# Patient Record
Sex: Female | Born: 1950 | Race: White | Hispanic: No | Smoking: Former smoker
Health system: Southern US, Community
[De-identification: ages and names within clinical notes are randomized; demographics above are authoritative.]

## PROBLEM LIST (undated history)

## (undated) DIAGNOSIS — R41841 Cognitive communication deficit: Secondary | ICD-10-CM

## (undated) DIAGNOSIS — K5090 Crohn's disease, unspecified, without complications: Secondary | ICD-10-CM

## (undated) DIAGNOSIS — R278 Other lack of coordination: Secondary | ICD-10-CM

## (undated) DIAGNOSIS — M6281 Muscle weakness (generalized): Secondary | ICD-10-CM

## (undated) DIAGNOSIS — R2681 Unsteadiness on feet: Secondary | ICD-10-CM

## (undated) DIAGNOSIS — R7881 Bacteremia: Secondary | ICD-10-CM

---

## 2019-01-24 ENCOUNTER — Inpatient Hospital Stay (HOSPITAL_COMMUNITY)
Admission: EM | Admit: 2019-01-24 | Discharge: 2019-02-01 | DRG: 315 | Disposition: A | Discharge status: Skilled Nursing Facility | Payer: Medicare Other | Attending: Internal Medicine | Admitting: Internal Medicine

## 2019-01-24 ENCOUNTER — Observation Stay (HOSPITAL_COMMUNITY): Payer: Medicare Other

## 2019-01-24 ENCOUNTER — Emergency Department (HOSPITAL_COMMUNITY): Payer: Medicare Other

## 2019-01-24 ENCOUNTER — Other Ambulatory Visit: Payer: Self-pay

## 2019-01-24 ENCOUNTER — Encounter (HOSPITAL_COMMUNITY): Payer: Self-pay | Admitting: Emergency Medicine

## 2019-01-24 DIAGNOSIS — K632 Fistula of intestine: Secondary | ICD-10-CM | POA: Diagnosis present

## 2019-01-24 DIAGNOSIS — R748 Abnormal levels of other serum enzymes: Secondary | ICD-10-CM | POA: Diagnosis present

## 2019-01-24 DIAGNOSIS — F329 Major depressive disorder, single episode, unspecified: Secondary | ICD-10-CM | POA: Diagnosis present

## 2019-01-24 DIAGNOSIS — D649 Anemia, unspecified: Secondary | ICD-10-CM

## 2019-01-24 DIAGNOSIS — E871 Hypo-osmolality and hyponatremia: Secondary | ICD-10-CM

## 2019-01-24 DIAGNOSIS — E876 Hypokalemia: Secondary | ICD-10-CM | POA: Diagnosis present

## 2019-01-24 DIAGNOSIS — R7881 Bacteremia: Secondary | ICD-10-CM | POA: Diagnosis present

## 2019-01-24 DIAGNOSIS — Z794 Long term (current) use of insulin: Secondary | ICD-10-CM

## 2019-01-24 DIAGNOSIS — Z681 Body mass index (BMI) 19 or less, adult: Secondary | ICD-10-CM

## 2019-01-24 DIAGNOSIS — K5090 Crohn's disease, unspecified, without complications: Secondary | ICD-10-CM | POA: Diagnosis present

## 2019-01-24 DIAGNOSIS — D638 Anemia in other chronic diseases classified elsewhere: Secondary | ICD-10-CM | POA: Diagnosis present

## 2019-01-24 DIAGNOSIS — A0472 Enterocolitis due to Clostridium difficile, not specified as recurrent: Secondary | ICD-10-CM

## 2019-01-24 DIAGNOSIS — Z20828 Contact with and (suspected) exposure to other viral communicable diseases: Secondary | ICD-10-CM | POA: Diagnosis present

## 2019-01-24 DIAGNOSIS — G8929 Other chronic pain: Secondary | ICD-10-CM | POA: Diagnosis present

## 2019-01-24 DIAGNOSIS — K50113 Crohn's disease of large intestine with fistula: Secondary | ICD-10-CM | POA: Diagnosis not present

## 2019-01-24 DIAGNOSIS — Z87891 Personal history of nicotine dependence: Secondary | ICD-10-CM

## 2019-01-24 DIAGNOSIS — F419 Anxiety disorder, unspecified: Secondary | ICD-10-CM | POA: Diagnosis present

## 2019-01-24 DIAGNOSIS — B957 Other staphylococcus as the cause of diseases classified elsewhere: Secondary | ICD-10-CM | POA: Diagnosis present

## 2019-01-24 DIAGNOSIS — M797 Fibromyalgia: Secondary | ICD-10-CM | POA: Diagnosis present

## 2019-01-24 DIAGNOSIS — Z881 Allergy status to other antibiotic agents status: Secondary | ICD-10-CM

## 2019-01-24 DIAGNOSIS — Z79899 Other long term (current) drug therapy: Secondary | ICD-10-CM

## 2019-01-24 DIAGNOSIS — Y838 Other surgical procedures as the cause of abnormal reaction of the patient, or of later complication, without mention of misadventure at the time of the procedure: Secondary | ICD-10-CM | POA: Diagnosis present

## 2019-01-24 DIAGNOSIS — T80211A Bloodstream infection due to central venous catheter, initial encounter: Secondary | ICD-10-CM | POA: Diagnosis not present

## 2019-01-24 DIAGNOSIS — A0471 Enterocolitis due to Clostridium difficile, recurrent: Secondary | ICD-10-CM | POA: Diagnosis present

## 2019-01-24 DIAGNOSIS — K219 Gastro-esophageal reflux disease without esophagitis: Secondary | ICD-10-CM | POA: Diagnosis present

## 2019-01-24 DIAGNOSIS — Z888 Allergy status to other drugs, medicaments and biological substances status: Secondary | ICD-10-CM

## 2019-01-24 DIAGNOSIS — Z22322 Carrier or suspected carrier of Methicillin resistant Staphylococcus aureus: Secondary | ICD-10-CM

## 2019-01-24 DIAGNOSIS — E785 Hyperlipidemia, unspecified: Secondary | ICD-10-CM | POA: Diagnosis present

## 2019-01-24 DIAGNOSIS — R64 Cachexia: Secondary | ICD-10-CM | POA: Diagnosis present

## 2019-01-24 LAB — CBC WITH DIFFERENTIAL/PLATELET
Abs Immature Granulocytes: 0.03 10*3/uL (ref 0.00–0.07)
Basophils Absolute: 0 10*3/uL (ref 0.0–0.1)
Basophils Relative: 0 %
Eosinophils Absolute: 0 10*3/uL (ref 0.0–0.5)
Eosinophils Relative: 0 %
HCT: 25.4 % — ABNORMAL LOW (ref 36.0–46.0)
Hemoglobin: 7.6 g/dL — ABNORMAL LOW (ref 12.0–15.0)
Immature Granulocytes: 0 %
Lymphocytes Relative: 21 %
Lymphs Abs: 1.4 10*3/uL (ref 0.7–4.0)
MCH: 27.5 pg (ref 26.0–34.0)
MCHC: 29.9 g/dL — ABNORMAL LOW (ref 30.0–36.0)
MCV: 92 fL (ref 80.0–100.0)
Monocytes Absolute: 0.4 10*3/uL (ref 0.1–1.0)
Monocytes Relative: 6 %
Neutro Abs: 4.9 10*3/uL (ref 1.7–7.7)
Neutrophils Relative %: 73 %
Platelets: 234 10*3/uL (ref 150–400)
RBC: 2.76 MIL/uL — ABNORMAL LOW (ref 3.87–5.11)
RDW: 16.7 % — ABNORMAL HIGH (ref 11.5–15.5)
WBC: 6.7 10*3/uL (ref 4.0–10.5)
nRBC: 0 % (ref 0.0–0.2)

## 2019-01-24 LAB — COMPREHENSIVE METABOLIC PANEL
ALT: 66 U/L — ABNORMAL HIGH (ref 0–44)
AST: 52 U/L — ABNORMAL HIGH (ref 15–41)
Albumin: 2.4 g/dL — ABNORMAL LOW (ref 3.5–5.0)
Alkaline Phosphatase: 169 U/L — ABNORMAL HIGH (ref 38–126)
Anion gap: 9 (ref 5–15)
BUN: 57 mg/dL — ABNORMAL HIGH (ref 8–23)
CO2: 19 mmol/L — ABNORMAL LOW (ref 22–32)
Calcium: 9.4 mg/dL (ref 8.9–10.3)
Chloride: 101 mmol/L (ref 98–111)
Creatinine, Ser: 0.97 mg/dL (ref 0.44–1.00)
GFR calc Af Amer: >60 mL/min (ref >60)
GFR calc non Af Amer: >60 mL/min (ref >60)
Glucose, Bld: 102 mg/dL — ABNORMAL HIGH (ref 70–99)
Potassium: 4.6 mmol/L (ref 3.5–5.1)
Sodium: 129 mmol/L — ABNORMAL LOW (ref 135–145)
Total Bilirubin: 0.9 mg/dL (ref 0.3–1.2)
Total Protein: 7.5 g/dL (ref 6.5–8.1)

## 2019-01-24 LAB — LACTIC ACID, PLASMA: Lactic Acid, Venous: 1 mmol/L (ref 0.5–1.9)

## 2019-01-24 LAB — ABO/RH: ABO/RH(D): O POS

## 2019-01-24 LAB — HIV ANTIBODY (ROUTINE TESTING W REFLEX): HIV Screen 4th Generation wRfx: NONREACTIVE

## 2019-01-24 LAB — SARS CORONAVIRUS 2 BY RT PCR (HOSPITAL ORDER, PERFORMED IN ~~LOC~~ HOSPITAL LAB): SARS Coronavirus 2: NEGATIVE

## 2019-01-24 MED ORDER — LOPERAMIDE HCL 2 MG PO CAPS
4.0000 mg | ORAL_CAPSULE | Freq: Four times a day (QID) | ORAL | Status: DC | PRN
  Administered 2019-01-30: 01:00:00 4 mg via ORAL
  Filled 2019-01-24 (×2): qty 2

## 2019-01-24 MED ORDER — ACETAMINOPHEN 500 MG PO TABS
1000.0000 mg | ORAL_TABLET | Freq: Once | ORAL | Status: AC
  Administered 2019-01-24: 1000 mg via ORAL
  Filled 2019-01-24: qty 2

## 2019-01-24 MED ORDER — VANCOMYCIN 50 MG/ML ORAL SOLUTION
125.0000 mg | Freq: Four times a day (QID) | ORAL | Status: DC
  Administered 2019-01-24 – 2019-02-01 (×34): 125 mg via ORAL
  Filled 2019-01-24 (×36): qty 2.5

## 2019-01-24 MED ORDER — PRAVASTATIN SODIUM 20 MG PO TABS
20.0000 mg | ORAL_TABLET | Freq: Every day | ORAL | Status: DC
  Administered 2019-01-24 – 2019-02-01 (×9): 20 mg via ORAL
  Filled 2019-01-24 (×9): qty 1

## 2019-01-24 MED ORDER — ACETAMINOPHEN 650 MG RE SUPP: 650.0000 mg | Freq: Four times a day (QID) | RECTAL | Status: DC | PRN

## 2019-01-24 MED ORDER — ESCITALOPRAM OXALATE 5 MG PO TABS
5.0000 mg | ORAL_TABLET | Freq: Every day | ORAL | Status: DC
  Administered 2019-01-24 – 2019-02-01 (×9): 5 mg via ORAL
  Filled 2019-01-24 (×9): qty 1

## 2019-01-24 MED ORDER — ENOXAPARIN SODIUM 30 MG/0.3ML ~~LOC~~ SOLN
30.0000 mg | SUBCUTANEOUS | Status: DC
  Administered 2019-01-24 – 2019-01-30 (×7): 30 mg via SUBCUTANEOUS
  Filled 2019-01-24 (×7): qty 0.3

## 2019-01-24 MED ORDER — DERMACLOUD EX CREA: 1.0000 "application " | TOPICAL_CREAM | Freq: Two times a day (BID) | CUTANEOUS | Status: DC | PRN

## 2019-01-24 MED ORDER — MUSCLE RUB 10-15 % EX CREA
TOPICAL_CREAM | Freq: Three times a day (TID) | CUTANEOUS | Status: DC | PRN
  Filled 2019-01-24: qty 85

## 2019-01-24 MED ORDER — VANCOMYCIN HCL 500 MG IV SOLR
500.0000 mg | INTRAVENOUS | Status: DC
  Administered 2019-01-25 – 2019-01-26 (×2): 500 mg via INTRAVENOUS
  Filled 2019-01-24 (×3): qty 500

## 2019-01-24 MED ORDER — METHOCARBAMOL 500 MG PO TABS
500.0000 mg | ORAL_TABLET | Freq: Two times a day (BID) | ORAL | Status: DC | PRN
  Administered 2019-01-24 – 2019-01-26 (×4): 500 mg via ORAL
  Filled 2019-01-24 (×4): qty 1

## 2019-01-24 MED ORDER — LIDOCAINE HCL 4 % EX CREA: 1.0000 "application " | TOPICAL_CREAM | Freq: Three times a day (TID) | CUTANEOUS | Status: DC | PRN

## 2019-01-24 MED ORDER — INSULIN ASPART 100 UNIT/ML ~~LOC~~ SOLN
1.0000 [IU] | Freq: Three times a day (TID) | SUBCUTANEOUS | Status: DC
  Filled 2019-01-24: qty 0.09

## 2019-01-24 MED ORDER — ZINC OXIDE 40 % EX OINT
TOPICAL_OINTMENT | Freq: Two times a day (BID) | CUTANEOUS | Status: DC | PRN
  Filled 2019-01-24: qty 57

## 2019-01-24 MED ORDER — IOHEXOL 300 MG/ML  SOLN
75.0000 mL | Freq: Once | INTRAMUSCULAR | Status: AC | PRN
  Administered 2019-01-24: 75 mL via INTRAVENOUS

## 2019-01-24 MED ORDER — LIDOCAINE 5 % EX PTCH
1.0000 | MEDICATED_PATCH | CUTANEOUS | Status: DC
  Administered 2019-01-25 – 2019-02-01 (×8): 1 via TRANSDERMAL
  Filled 2019-01-24 (×9): qty 1

## 2019-01-24 MED ORDER — ALBUTEROL SULFATE (2.5 MG/3ML) 0.083% IN NEBU: 2.5000 mg | INHALATION_SOLUTION | RESPIRATORY_TRACT | Status: DC | PRN

## 2019-01-24 MED ORDER — OXYCODONE HCL 5 MG PO TABS
5.0000 mg | ORAL_TABLET | ORAL | Status: DC | PRN
  Administered 2019-01-24 – 2019-02-01 (×19): 5 mg via ORAL
  Filled 2019-01-24 (×19): qty 1

## 2019-01-24 MED ORDER — SODIUM CHLORIDE 0.9 % IV SOLN
2.0000 g | Freq: Once | INTRAVENOUS | Status: AC
  Administered 2019-01-24: 05:00:00 2 g via INTRAVENOUS
  Filled 2019-01-24: qty 2

## 2019-01-24 MED ORDER — VANCOMYCIN HCL IN DEXTROSE 1-5 GM/200ML-% IV SOLN
1000.0000 mg | Freq: Once | INTRAVENOUS | Status: AC
  Administered 2019-01-24: 1000 mg via INTRAVENOUS
  Filled 2019-01-24: qty 200

## 2019-01-24 MED ORDER — KETOROLAC TROMETHAMINE 30 MG/ML IJ SOLN
15.0000 mg | Freq: Once | INTRAMUSCULAR | Status: AC
  Administered 2019-01-24: 15 mg via INTRAVENOUS
  Filled 2019-01-24: qty 1

## 2019-01-24 MED ORDER — SODIUM CHLORIDE (PF) 0.9 % IJ SOLN
INTRAMUSCULAR | Status: AC
  Filled 2019-01-24: qty 50

## 2019-01-24 MED ORDER — HYOSCYAMINE SULFATE 0.125 MG SL SUBL
0.1250 mg | SUBLINGUAL_TABLET | SUBLINGUAL | Status: DC | PRN
  Administered 2019-01-29 – 2019-01-31 (×2): 0.125 mg via SUBLINGUAL
  Filled 2019-01-24 (×3): qty 1

## 2019-01-24 MED ORDER — ZOLPIDEM TARTRATE 5 MG PO TABS
5.0000 mg | ORAL_TABLET | Freq: Every evening | ORAL | Status: DC | PRN
  Administered 2019-01-24 – 2019-01-31 (×7): 5 mg via ORAL
  Filled 2019-01-24 (×7): qty 1

## 2019-01-24 MED ORDER — BUPROPION HCL ER (XL) 300 MG PO TB24
300.0000 mg | ORAL_TABLET | Freq: Every day | ORAL | Status: DC
  Administered 2019-01-24 – 2019-02-01 (×9): 300 mg via ORAL
  Filled 2019-01-24 (×9): qty 1

## 2019-01-24 MED ORDER — ONDANSETRON HCL 4 MG/2ML IJ SOLN: 4.0000 mg | Freq: Four times a day (QID) | INTRAMUSCULAR | Status: DC | PRN

## 2019-01-24 MED ORDER — ACETAMINOPHEN 325 MG PO TABS
650.0000 mg | ORAL_TABLET | Freq: Four times a day (QID) | ORAL | Status: DC | PRN
  Administered 2019-01-25 – 2019-01-30 (×3): 650 mg via ORAL
  Filled 2019-01-24 (×3): qty 2

## 2019-01-24 MED ORDER — PANTOPRAZOLE SODIUM 40 MG PO TBEC
40.0000 mg | DELAYED_RELEASE_TABLET | Freq: Every day | ORAL | Status: DC
  Administered 2019-01-24 – 2019-02-01 (×9): 40 mg via ORAL
  Filled 2019-01-24 (×9): qty 1

## 2019-01-24 MED ORDER — SODIUM CHLORIDE 0.9 % IV SOLN
INTRAVENOUS | Status: DC
  Administered 2019-01-24 – 2019-01-30 (×7): via INTRAVENOUS

## 2019-01-24 MED ORDER — ONDANSETRON HCL 4 MG PO TABS
4.0000 mg | ORAL_TABLET | Freq: Four times a day (QID) | ORAL | Status: DC | PRN
  Filled 2019-01-24: qty 1

## 2019-01-24 MED ORDER — SODIUM CHLORIDE 0.9 % IV SOLN
2.0000 g | Freq: Two times a day (BID) | INTRAVENOUS | Status: DC
  Administered 2019-01-24 – 2019-01-25 (×2): 2 g via INTRAVENOUS
  Filled 2019-01-24 (×2): qty 2

## 2019-01-24 MED ORDER — MELATONIN 3 MG PO TABS
3.0000 mg | ORAL_TABLET | Freq: Every day | ORAL | Status: DC
  Administered 2019-01-30 – 2019-01-31 (×2): 3 mg via ORAL
  Filled 2019-01-24 (×9): qty 1

## 2019-01-24 MED ORDER — SACCHAROMYCES BOULARDII 250 MG PO CAPS
250.0000 mg | ORAL_CAPSULE | Freq: Two times a day (BID) | ORAL | Status: DC
  Administered 2019-01-24 – 2019-02-01 (×17): 250 mg via ORAL
  Filled 2019-01-24 (×18): qty 1

## 2019-01-24 NOTE — ED Triage Notes (Signed)
BIB [facility] from [facility] is there for rehab. Pt reports chronic bilateral hip pain. Per PTAR Pt went to [facility] for pre surgery blood work appt. for colostomy. Per paper work results shpwed staph infection, positive blood cultures. Pt has hx of chron's disease.

## 2019-01-24 NOTE — ED Provider Notes (Signed)
[facility] DEPT Provider Note   CSN: [id number] Arrival date & time: 01/24/19  0206     History   Chief Complaint Chief Complaint  Patient presents with  . Abnormal Lab    HPI Katrina Boyer is a 68 y.o. female.     The history is provided by the patient.  Abnormal Lab Time since result:  Positive blood cultures, staph Patient referred by:  Specialist Resulting agency:  External Result type comment:  Infectious disease Patient with Crohns with Picc line for TPN presents from [facility] with report of staph in 2 outside blood cultures that patient had performed for pre op for colectomy.  No f/c/r. She is not having back pain or weakness.  She has chronic hip pain.  No cough.  No SOB.    History reviewed. No pertinent past medical history.  There are no active problems to display for this patient.   History reviewed. No pertinent surgical history.   OB History   No obstetric history on file.      Home Medications    Prior to Admission medications   Medication Sig Start Date End Date Taking? Authorizing Provider  acetaminophen (TYLENOL) 325 MG tablet Take 650 mg by mouth every 4 (four) hours as needed for mild pain.   Yes [provider]  albuterol (PROVENTIL) (2.5 MG/3ML) 0.083% nebulizer solution Take 2.5 mg by nebulization every 4 (four) hours as needed for wheezing or shortness of breath.   Yes [provider]  buPROPion (WELLBUTRIN XL) 300 MG 24 hr tablet Take 300 mg by mouth daily.   Yes [provider]  cholecalciferol (VITAMIN D3) 25 MCG (1000 UT) tablet Take 2,000 Units by mouth daily.   Yes [provider]  escitalopram (LEXAPRO) 10 MG tablet Take 5 mg by mouth daily.   Yes [provider]  hyoscyamine (LEVSIN SL) 0.125 MG SL tablet Place 0.125 mg under the tongue every 4 (four) hours as needed for cramping.   Yes [provider]  Infant Care Products (DERMACLOUD) CREA Apply 1  application topically 2 (two) times daily as needed (to buttocks).   Yes [provider]  insulin aspart (NOVOLOG) 100 UNIT/ML injection Inject 1-9 Units into the skin 3 (three) times daily before meals. 101-150=1 unit 151-200=2 unit 201-250=3 unit 251-300=5 unit 301-350=7 unit >350= 9 unit Over 400 call MD   Yes [provider]  lidocaine (LIDODERM) 5 % Place 1 patch onto the skin daily. Remove & Discard patch within 12 hours or as directed by MD   Yes [provider]  Lidocaine HCl (ASPERCREME W/LIDOCAINE) 4 % CREA Apply 1 application topically 3 (three) times daily as needed (shoulder pain).   Yes [provider]  loperamide (IMODIUM) 2 MG capsule Take 4 mg by mouth 4 (four) times daily as needed for diarrhea or loose stools.   Yes [provider]  Melatonin 3 MG TABS Take 3 mg by mouth at bedtime.   Yes [provider]  methocarbamol (ROBAXIN) 500 MG tablet Take 500 mg by mouth every 12 (twelve) hours as needed for muscle spasms.   Yes [provider]  oxyCODONE (OXY IR/ROXICODONE) 5 MG immediate release tablet Take 5 mg by mouth every 6 (six) hours as needed (pain).   Yes [provider]  pantoprazole (PROTONIX) 40 MG tablet Take 40 mg by mouth daily.   Yes [provider]  potassium chloride (KLOR-CON) 10 MEQ tablet Take 10 mEq by mouth daily.  Yes [provider]  pravastatin (PRAVACHOL) 20 MG tablet Take 20 mg by mouth daily.   Yes [provider]  saccharomyces boulardii (FLORASTOR) 250 MG capsule Take 250 mg by mouth 2 (two) times daily.   Yes [provider]  vancomycin (VANCOCIN) 125 MG capsule Take 125 mg by mouth 4 (four) times daily.   Yes [provider]  Vitamin D, Ergocalciferol, (DRISDOL) 1.25 MG (50000 UT) CAPS capsule Take 50,000 Units by mouth every 7 (seven) days. Wednesdays   Yes [provider]    Family History No family history on file.   Social History Social History   Tobacco Use  . Smoking status: Not on file  Substance Use Topics  . Alcohol use: Not on file  . Drug use: Not on file     Allergies   Piperacillin-tazobactam in dex, Metronidazole, Gabapentin, Mercaptopurine, and Ciprofloxacin   Review of Systems Review of Systems  Constitutional: Negative for fever.  HENT: Negative for congestion.   Eyes: Negative for visual disturbance.  Respiratory: Negative for cough.   Cardiovascular: Negative for chest pain.  Gastrointestinal: Negative for abdominal pain.  Genitourinary: Negative for dysuria.  Musculoskeletal: Positive for arthralgias. Negative for back pain.  Skin: Negative for color change.  Neurological: Negative for weakness.  Psychiatric/Behavioral: Negative for agitation.  All other systems reviewed and are negative.    Physical Exam Updated Vital Signs BP (!) 102/53 (BP Location: Left Arm)   Pulse 75   Temp 97.9 F (36.6 C) (Oral)   Resp 20   Ht 5' 4"  (1.626 m)   Wt 45.8 kg   SpO2 100%   BMI 17.34 kg/m   Physical Exam Vitals signs and nursing note reviewed.  Constitutional:      General: She is not in acute distress.    Appearance: She is not toxic-appearing.  HENT:     Head: Normocephalic and atraumatic.     Nose: Nose normal.     Mouth/Throat:     Mouth: Mucous membranes are moist.     Pharynx: Oropharynx is clear.  Eyes:     Conjunctiva/sclera: Conjunctivae normal.     Pupils: Pupils are equal, round, and reactive to light.  Neck:     Musculoskeletal: Normal range of motion and neck supple.  Cardiovascular:     Rate and Rhythm: Normal rate and regular rhythm.     Pulses: Normal pulses.     Heart sounds: Normal heart sounds.  Pulmonary:     Effort: Pulmonary effort is normal.     Breath sounds: Normal breath sounds.  Abdominal:     General: Abdomen is flat. Bowel sounds are normal.     Tenderness: There is no abdominal tenderness. There is no guarding or rebound.   Musculoskeletal: Normal range of motion.        General: No swelling or tenderness.  Skin:    General: Skin is warm and dry.     Capillary Refill: Capillary refill takes less than 2 seconds.  Neurological:     General: No focal deficit present.     Mental Status: She is alert and oriented to person, place, and time.  Psychiatric:        Mood and Affect: Mood normal.        Behavior: Behavior normal.      ED Treatments / Results  Labs (all labs ordered are listed, but only abnormal results are displayed) Results for orders placed or performed during the hospital encounter of 01/24/19  CBC with Differential/Platelet  Result Value Ref Range   WBC 6.7 4.0 - 10.5 K/uL   RBC 2.76 (L) 3.87 - 5.11 MIL/uL   Hemoglobin 7.6 (L) 12.0 - 15.0 g/dL   HCT 25.4 (L) 36.0 - 46.0 %   MCV 92.0 80.0 - 100.0 fL   MCH 27.5 26.0 - 34.0 pg   MCHC 29.9 (L) 30.0 - 36.0 g/dL   RDW 16.7 (H) 11.5 - 15.5 %   Platelets 234 150 - 400 K/uL   nRBC 0.0 0.0 - 0.2 %   Neutrophils Relative % 73 %   Neutro Abs 4.9 1.7 - 7.7 K/uL   Lymphocytes Relative 21 %   Lymphs Abs 1.4 0.7 - 4.0 K/uL   Monocytes Relative 6 %   Monocytes Absolute 0.4 0.1 - 1.0 K/uL   Eosinophils Relative 0 %   Eosinophils Absolute 0.0 0.0 - 0.5 K/uL   Basophils Relative 0 %   Basophils Absolute 0.0 0.0 - 0.1 K/uL   Immature Granulocytes 0 %   Abs Immature Granulocytes 0.03 0.00 - 0.07 K/uL  Comprehensive metabolic panel  Result Value Ref Range   Sodium 129 (L) 135 - 145 mmol/L   Potassium 4.6 3.5 - 5.1 mmol/L   Chloride 101 98 - 111 mmol/L   CO2 19 (L) 22 - 32 mmol/L   Glucose, Bld 102 (H) 70 - 99 mg/dL   BUN 57 (H) 8 - 23 mg/dL   Creatinine, Ser 0.97 0.44 - 1.00 mg/dL   Calcium 9.4 8.9 - 10.3 mg/dL   Total Protein 7.5 6.5 - 8.1 g/dL   Albumin 2.4 (L) 3.5 - 5.0 g/dL   AST 52 (H) 15 - 41 U/L   ALT 66 (H) 0 - 44 U/L   Alkaline Phosphatase 169 (H) 38 - 126 U/L   Total Bilirubin 0.9 0.3 - 1.2 mg/dL   GFR calc non Af Amer >60 >60  mL/min   GFR calc Af Amer >60 >60 mL/min   Anion gap 9 5 - 15  Lactic acid, plasma  Result Value Ref Range   Lactic Acid, Venous 1.0 0.5 - 1.9 mmol/L   Dg Chest 2 View  Result Date: 01/24/2019 CLINICAL DATA:  Positive blood cultures EXAM: CHEST - 2 VIEW COMPARISON:  None. FINDINGS: No acute airspace disease or effusion. Right upper extremity venous catheter tip over the brachiocephalic region. Normal heart size. No pneumothorax. Suspect skin fold artifact over the left upper chest IMPRESSION: 1. Right upper extremity catheter tip overlies the brachiocephalic region 2. No focal airspace disease Electronically Signed   By: Donavan Foil M.D.   On: 01/24/2019 04:30   Dg Lumbar Spine Complete  Result Date: 01/24/2019 CLINICAL DATA:  Chronic pain EXAM: LUMBAR SPINE - COMPLETE 4+ VIEW COMPARISON:  None. FINDINGS: Five non rib-bearing lumbar type vertebra. Aortic atherosclerosis. Suspected transitional anatomy. For the purposes of reporting, first non rib-bearing lumbar vertebra will be designated L1. Alignment within normal limits. Treated compression deformity at L4. Mild to moderate degenerative changes at L3-L4, L4-L5 and L5-S1. IMPRESSION: No acute osseous abnormality. Mild to moderate diffuse degenerative change of the lower lumbar spine. Electronically Signed   By: Donavan Foil M.D.   On: 01/24/2019 04:08   Dg Hips Bilat W Or Wo Pelvis 3-4 Views  Result Date: 01/24/2019 CLINICAL DATA:  Chronic hip and low back pain EXAM: DG HIP (WITH OR WITHOUT PELVIS) 3-4V BILAT COMPARISON:  None. FINDINGS: Treated compression deformity in the lower spine. The SI joints are non  widened. The pubic symphysis and rami are intact. No fracture or malalignment. Joint spaces are relatively preserved. IMPRESSION: Negative. Electronically Signed   By: Donavan Foil M.D.   On: 01/24/2019 04:06    EKG None  Radiology Dg Chest 2 View  Result Date: 01/24/2019 CLINICAL DATA:  Positive blood cultures EXAM: CHEST - 2  VIEW COMPARISON:  None. FINDINGS: No acute airspace disease or effusion. Right upper extremity venous catheter tip over the brachiocephalic region. Normal heart size. No pneumothorax. Suspect skin fold artifact over the left upper chest IMPRESSION: 1. Right upper extremity catheter tip overlies the brachiocephalic region 2. No focal airspace disease Electronically Signed   By: Donavan Foil M.D.   On: 01/24/2019 04:30   Dg Lumbar Spine Complete  Result Date: 01/24/2019 CLINICAL DATA:  Chronic pain EXAM: LUMBAR SPINE - COMPLETE 4+ VIEW COMPARISON:  None. FINDINGS: Five non rib-bearing lumbar type vertebra. Aortic atherosclerosis. Suspected transitional anatomy. For the purposes of reporting, first non rib-bearing lumbar vertebra will be designated L1. Alignment within normal limits. Treated compression deformity at L4. Mild to moderate degenerative changes at L3-L4, L4-L5 and L5-S1. IMPRESSION: No acute osseous abnormality. Mild to moderate diffuse degenerative change of the lower lumbar spine. Electronically Signed   By: Donavan Foil M.D.   On: 01/24/2019 04:08   Dg Hips Bilat W Or Wo Pelvis 3-4 Views  Result Date: 01/24/2019 CLINICAL DATA:  Chronic hip and low back pain EXAM: DG HIP (WITH OR WITHOUT PELVIS) 3-4V BILAT COMPARISON:  None. FINDINGS: Treated compression deformity in the lower spine. The SI joints are non widened. The pubic symphysis and rami are intact. No fracture or malalignment. Joint spaces are relatively preserved. IMPRESSION: Negative. Electronically Signed   By: Donavan Foil M.D.   On: 01/24/2019 04:06    Procedures Procedures (including critical care time)  Medications Ordered in ED Medications  vancomycin (VANCOCIN) IVPB 1000 mg/200 mL premix (1,000 mg Intravenous New Bag/Given 01/24/19 0526)  acetaminophen (TYLENOL) tablet 1,000 mg (has no administration in time range)  ceFEPIme (MAXIPIME) 2 g in sodium chloride 0.9 % 100 mL IVPB (2 g Intravenous New Bag/Given 01/24/19  0446)      Final Clinical Impressions(s) / ED Diagnoses   Final diagnoses:  Symptomatic anemia  Positive blood cultures  Hyponatremia   Will admit to medicine for observation.  I suspect the PICC line to be the source as it has been in since the summer, her hip pain is chronic and unchanged   Topacio Cella, MD 01/24/19 0532

## 2019-01-24 NOTE — Progress Notes (Signed)
Received report from [facility] in ED

## 2019-01-24 NOTE — ED Notes (Signed)
Rpt called to [facility] [employer]

## 2019-01-24 NOTE — Progress Notes (Signed)
Patient came with R DL PICC, already out 7 cm; removed per MD order due to suspected infection. Site is clean, dry and intact. Vaseline gauze dressing applied. No s/s of bleeding noted upon removal. RN aware.

## 2019-01-24 NOTE — H&P (Signed)
History and Physical  Katrina Boyer ERX:[id number] DOB: 11-Oct-1950 DOA: 01/24/2019   Patient coming from: SNF  Chief Complaint: Bacteria in my blood  HPI: Katrina Boyer is a 68 y.o. female with medical history significant for Crohn's disease on TPN with perianal involvement, recurrent C. difficile, anxiety, was sent from SNF due to bacteremia.  Patient is currently being followed by gastroenterologist and surgeons in [place] [facility].  Patient noted to spike a temp in the SNF and subsequent blood culture drawn at the GI office was positive for gram-positive cocci.  Patient's gastroenterology was concerned for staph bacteremia given hx of PICC line, she was also concerned of possible spinal abscess as patient has been complaining of acute onset back pain, advised to send patient to the ED.  Patient currently complains of chronic right hip pain with no point tenderness, could not pinpoint her back pain.  Patient also complains of chronic diarrhea, for which she has about 5 bowel movements per day (not worse from her baseline).  Patient currently is afebrile, denies any chills, denies any abdominal pain, nausea/vomiting, chest pain, shortness of breath, cough.  Of note, GI ultimate plan due to her severe Crohn's disease is to have colectomy and end ileostomy pending optimization of nutrition via TPN, treatment of recurrent C. difficile.    ED Course: Afebrile, with no leukocytosis, vital signs somewhat stable.  Labs showing sodium of 129, hemoglobin of 7.6, BC x2 pending, hip x-ray unremarkable.  COVID-19 negative  Review of Systems Review of systems are otherwise negative   History reviewed. No pertinent past medical history. History reviewed. No pertinent surgical history.  Social History:  has no history on file for tobacco, alcohol, and drug.   Allergies  Allergen Reactions  . Piperacillin-Tazobactam In Dex     Thrombocytopenia   . Metronidazole     neuropathy  . Gabapentin   .  Mercaptopurine Nausea Only  . Ciprofloxacin Rash    No family history on file.    Prior to Admission medications   Medication Sig Start Date End Date Taking? Authorizing Provider  acetaminophen (TYLENOL) 325 MG tablet Take 650 mg by mouth every 4 (four) hours as needed for mild pain.   Yes [provider]  albuterol (PROVENTIL) (2.5 MG/3ML) 0.083% nebulizer solution Take 2.5 mg by nebulization every 4 (four) hours as needed for wheezing or shortness of breath.   Yes [provider]  buPROPion (WELLBUTRIN XL) 300 MG 24 hr tablet Take 300 mg by mouth daily.   Yes [provider]  cholecalciferol (VITAMIN D3) 25 MCG (1000 UT) tablet Take 2,000 Units by mouth daily.   Yes [provider]  escitalopram (LEXAPRO) 10 MG tablet Take 5 mg by mouth daily.   Yes [provider]  hyoscyamine (LEVSIN SL) 0.125 MG SL tablet Place 0.125 mg under the tongue every 4 (four) hours as needed for cramping.   Yes [provider]  Infant Care Products (DERMACLOUD) CREA Apply 1 application topically 2 (two) times daily as needed (to buttocks).   Yes [provider]  insulin aspart (NOVOLOG) 100 UNIT/ML injection Inject 1-9 Units into the skin 3 (three) times daily before meals. 101-150=1 unit 151-200=2 unit 201-250=3 unit 251-300=5 unit 301-350=7 unit >350= 9 unit Over 400 call MD   Yes [provider]  lidocaine (LIDODERM) 5 % Place 1 patch onto the skin daily. Remove & Discard patch within 12 hours or as directed by MD   Yes [provider]  Lidocaine  HCl (ASPERCREME W/LIDOCAINE) 4 % CREA Apply 1 application topically 3 (three) times daily as needed (shoulder pain).   Yes [provider]  loperamide (IMODIUM) 2 MG capsule Take 4 mg by mouth 4 (four) times daily as needed for diarrhea or loose stools.   Yes [provider]  Melatonin 3 MG TABS Take 3 mg by mouth at bedtime.   Yes [provider]   methocarbamol (ROBAXIN) 500 MG tablet Take 500 mg by mouth every 12 (twelve) hours as needed for muscle spasms.   Yes [provider]  oxyCODONE (OXY IR/ROXICODONE) 5 MG immediate release tablet Take 5 mg by mouth every 6 (six) hours as needed (pain).   Yes [provider]  pantoprazole (PROTONIX) 40 MG tablet Take 40 mg by mouth daily.   Yes [provider]  potassium chloride (KLOR-CON) 10 MEQ tablet Take 10 mEq by mouth daily.   Yes [provider]  pravastatin (PRAVACHOL) 20 MG tablet Take 20 mg by mouth daily.   Yes [provider]  saccharomyces boulardii (FLORASTOR) 250 MG capsule Take 250 mg by mouth 2 (two) times daily.   Yes [provider]  vancomycin (VANCOCIN) 125 MG capsule Take 125 mg by mouth 4 (four) times daily.   Yes [provider]  Vitamin D, Ergocalciferol, (DRISDOL) 1.25 MG (50000 UT) CAPS capsule Take 50,000 Units by mouth every 7 (seven) days. Wednesdays   Yes [provider]    Physical Exam: BP 117/67   Pulse 84   Temp 97.9 F (36.6 C) (Oral)   Resp 20   Ht 5' 4"  (1.626 m)   Wt 45.8 kg   SpO2 99%   BMI 17.34 kg/m    General: NAD, chronically ill-appearing, cachectic Eyes: Normal ENT: Normal Neck: Supple Cardiovascular: S1, S2 present Respiratory: CTA B Abdomen: Soft, nontender, nondistended, bowel sounds present Skin: Diffuse rash noted on bilateral lower extremity Musculoskeletal: No pedal edema bilaterally Psychiatric: Normal mood Neurologic: No focal neurologic deficit noted          Labs on Admission:  Basic Metabolic Panel: Recent Labs  Lab 01/24/19 0253  NA 129*  K 4.6  CL 101  CO2 19*  GLUCOSE 102*  BUN 57*  CREATININE 0.97  CALCIUM 9.4   Liver Function Tests: Recent Labs  Lab 01/24/19 0253  AST 52*  ALT 66*  ALKPHOS 169*  BILITOT 0.9  PROT 7.5  ALBUMIN 2.4*   No results for input(s): LIPASE, AMYLASE in the last 168 hours. No results for input(s):  AMMONIA in the last 168 hours. CBC: Recent Labs  Lab 01/24/19 0253  WBC 6.7  NEUTROABS 4.9  HGB 7.6*  HCT 25.4*  MCV 92.0  PLT 234   Cardiac Enzymes: No results for input(s): CKTOTAL, CKMB, CKMBINDEX, TROPONINI in the last 168 hours.  BNP (last 3 results) No results for input(s): BNP in the last 8760 hours.  ProBNP (last 3 results) No results for input(s): PROBNP in the last 8760 hours.  CBG: No results for input(s): GLUCAP in the last 168 hours.  Radiological Exams on Admission: Dg Chest 2 View  Result Date: 01/24/2019 CLINICAL DATA:  Positive blood cultures EXAM: CHEST - 2 VIEW COMPARISON:  None. FINDINGS: No acute airspace disease or effusion. Right upper extremity venous catheter tip over the brachiocephalic region. Normal heart size. No pneumothorax. Suspect skin fold artifact over the left upper chest IMPRESSION: 1. Right upper extremity catheter tip overlies the brachiocephalic region 2. No focal airspace disease  Electronically Signed   By: Donavan Foil M.D.   On: 01/24/2019 04:30   Dg Lumbar Spine Complete  Result Date: 01/24/2019 CLINICAL DATA:  Chronic pain EXAM: LUMBAR SPINE - COMPLETE 4+ VIEW COMPARISON:  None. FINDINGS: Five non rib-bearing lumbar type vertebra. Aortic atherosclerosis. Suspected transitional anatomy. For the purposes of reporting, first non rib-bearing lumbar vertebra will be designated L1. Alignment within normal limits. Treated compression deformity at L4. Mild to moderate degenerative changes at L3-L4, L4-L5 and L5-S1. IMPRESSION: No acute osseous abnormality. Mild to moderate diffuse degenerative change of the lower lumbar spine. Electronically Signed   By: Donavan Foil M.D.   On: 01/24/2019 04:08   Dg Hips Bilat W Or Wo Pelvis 3-4 Views  Result Date: 01/24/2019 CLINICAL DATA:  Chronic hip and low back pain EXAM: DG HIP (WITH OR WITHOUT PELVIS) 3-4V BILAT COMPARISON:  None. FINDINGS: Treated compression deformity in the lower spine. The SI  joints are non widened. The pubic symphysis and rami are intact. No fracture or malalignment. Joint spaces are relatively preserved. IMPRESSION: Negative. Electronically Signed   By: Donavan Foil M.D.   On: 01/24/2019 04:06    EKG: Pending  Assessment/Plan Present on Admission: . 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 Difficile Continues to have multiple bowel movements per day GI at [facility] recommended continuing oral vancomycin, PRN Imodium, probiotics Contact and enteric precautions IV fluids  Hyponatremia Likely due to poor oral intake Continue IV fluids Daily BMP  Elevated liver enzymes Chronic Daily CMP  Anemia of chronic disease Baseline hemoglobin around 8 Type and screen Daily CBC  GERD Continue PPI  Hyperlipidemia Continue statins  Fibromyalgia Continue chronic pain regimen  Anxiety/depression Continue Wellbutrin, Lexapro          DVT  prophylaxis: Lovenox  Code Status: Full  Family Communication: Discussed plan extensively with patient  Disposition Plan: Likely back to SNF  Consults called: Spoke to patient's GI at [facility], Dr Laverta [place], cell phone [phone], agreed with my plan, she is available for further questions or recommendations  Admission status: Observation    Alma Friendly MD [facility]   If 7PM-7AM, please contact night-coverage [email or web address]  01/24/2019, 7:56 AM

## 2019-01-24 NOTE — ED Notes (Signed)
Pt returned form Xray

## 2019-01-24 NOTE — ED Notes (Signed)
Patient transported to X-ray 

## 2019-01-24 NOTE — Progress Notes (Signed)
Pharmacy Antibiotic Note  Katrina Boyer is a 68 y.o. female admitted on 01/24/2019 with bacteremia.  Pharmacy has been consulted for Vancomycin & Cefepime dosing. Afeb, WBC 6.7, SCr 0.97 Hx of PICC line noted, current peripheral IV  Plan: Cefepime 2gm q12 Vanc 1gm x1, then 570m q24, AUC 573, SCr 1, Vd 0.5   Height: 5' 4"  (162.6 cm) Weight: 101 lb (45.8 kg) IBW/kg (Calculated) : 54.7  Temp (24hrs), Avg:97.9 F (36.6 C), Min:97.9 F (36.6 C), Max:97.9 F (36.6 C)  Recent Labs  Lab 01/24/19 0253 01/24/19 0411  WBC 6.7  --   CREATININE 0.97  --   LATICACIDVEN  --  1.0    Estimated Creatinine Clearance: 41.8 mL/min (by C-G formula based on SCr of 0.97 mg/dL).    Allergies  Allergen Reactions  . Piperacillin-Tazobactam In Dex     Thrombocytopenia   . Metronidazole     neuropathy  . Gabapentin   . Mercaptopurine Nausea Only  . Ciprofloxacin Rash    Antimicrobials this admission: 10/14 Vancomycin >>  10/14 Cefepime >>   Dose adjustments this admission:  Microbiology results: 10/14 BCx: sent, drawn after abx given [facility]  Thank you for allowing pharmacy to be a part of this patient's care.  GMinda DittoPharmD 01/24/2019 8:11 AM

## 2019-01-25 ENCOUNTER — Inpatient Hospital Stay (HOSPITAL_COMMUNITY): Payer: Medicare Other

## 2019-01-25 DIAGNOSIS — G8929 Other chronic pain: Secondary | ICD-10-CM | POA: Diagnosis present

## 2019-01-25 DIAGNOSIS — R7881 Bacteremia: Secondary | ICD-10-CM | POA: Diagnosis present

## 2019-01-25 DIAGNOSIS — K5090 Crohn's disease, unspecified, without complications: Secondary | ICD-10-CM | POA: Diagnosis present

## 2019-01-25 DIAGNOSIS — D638 Anemia in other chronic diseases classified elsewhere: Secondary | ICD-10-CM | POA: Diagnosis present

## 2019-01-25 DIAGNOSIS — B9561 Methicillin susceptible Staphylococcus aureus infection as the cause of diseases classified elsewhere: Secondary | ICD-10-CM | POA: Diagnosis not present

## 2019-01-25 DIAGNOSIS — Z881 Allergy status to other antibiotic agents status: Secondary | ICD-10-CM | POA: Diagnosis not present

## 2019-01-25 DIAGNOSIS — E785 Hyperlipidemia, unspecified: Secondary | ICD-10-CM | POA: Diagnosis present

## 2019-01-25 DIAGNOSIS — Z681 Body mass index (BMI) 19 or less, adult: Secondary | ICD-10-CM | POA: Diagnosis not present

## 2019-01-25 DIAGNOSIS — T80211A Bloodstream infection due to central venous catheter, initial encounter: Secondary | ICD-10-CM | POA: Diagnosis present

## 2019-01-25 DIAGNOSIS — E876 Hypokalemia: Secondary | ICD-10-CM | POA: Diagnosis present

## 2019-01-25 DIAGNOSIS — K219 Gastro-esophageal reflux disease without esophagitis: Secondary | ICD-10-CM | POA: Diagnosis present

## 2019-01-25 DIAGNOSIS — Z794 Long term (current) use of insulin: Secondary | ICD-10-CM | POA: Diagnosis not present

## 2019-01-25 DIAGNOSIS — K50913 Crohn's disease, unspecified, with fistula: Secondary | ICD-10-CM | POA: Diagnosis not present

## 2019-01-25 DIAGNOSIS — R748 Abnormal levels of other serum enzymes: Secondary | ICD-10-CM | POA: Diagnosis present

## 2019-01-25 DIAGNOSIS — B9562 Methicillin resistant Staphylococcus aureus infection as the cause of diseases classified elsewhere: Secondary | ICD-10-CM | POA: Diagnosis present

## 2019-01-25 DIAGNOSIS — M797 Fibromyalgia: Secondary | ICD-10-CM | POA: Diagnosis present

## 2019-01-25 DIAGNOSIS — Y838 Other surgical procedures as the cause of abnormal reaction of the patient, or of later complication, without mention of misadventure at the time of the procedure: Secondary | ICD-10-CM | POA: Diagnosis present

## 2019-01-25 DIAGNOSIS — E871 Hypo-osmolality and hyponatremia: Secondary | ICD-10-CM | POA: Diagnosis present

## 2019-01-25 DIAGNOSIS — A0471 Enterocolitis due to Clostridium difficile, recurrent: Secondary | ICD-10-CM | POA: Diagnosis present

## 2019-01-25 DIAGNOSIS — B957 Other staphylococcus as the cause of diseases classified elsewhere: Secondary | ICD-10-CM | POA: Diagnosis present

## 2019-01-25 DIAGNOSIS — F419 Anxiety disorder, unspecified: Secondary | ICD-10-CM | POA: Diagnosis present

## 2019-01-25 DIAGNOSIS — Z22322 Carrier or suspected carrier of Methicillin resistant Staphylococcus aureus: Secondary | ICD-10-CM | POA: Diagnosis not present

## 2019-01-25 DIAGNOSIS — K632 Fistula of intestine: Secondary | ICD-10-CM | POA: Diagnosis present

## 2019-01-25 DIAGNOSIS — Z20828 Contact with and (suspected) exposure to other viral communicable diseases: Secondary | ICD-10-CM | POA: Diagnosis present

## 2019-01-25 DIAGNOSIS — F329 Major depressive disorder, single episode, unspecified: Secondary | ICD-10-CM | POA: Diagnosis present

## 2019-01-25 DIAGNOSIS — E46 Unspecified protein-calorie malnutrition: Secondary | ICD-10-CM | POA: Diagnosis not present

## 2019-01-25 DIAGNOSIS — R64 Cachexia: Secondary | ICD-10-CM | POA: Diagnosis present

## 2019-01-25 DIAGNOSIS — Z79899 Other long term (current) drug therapy: Secondary | ICD-10-CM | POA: Diagnosis not present

## 2019-01-25 DIAGNOSIS — Z87891 Personal history of nicotine dependence: Secondary | ICD-10-CM | POA: Diagnosis not present

## 2019-01-25 LAB — COMPREHENSIVE METABOLIC PANEL
ALT: 47 U/L — ABNORMAL HIGH (ref 0–44)
AST: 35 U/L (ref 15–41)
Albumin: 2.1 g/dL — ABNORMAL LOW (ref 3.5–5.0)
Alkaline Phosphatase: 139 U/L — ABNORMAL HIGH (ref 38–126)
Anion gap: 6 (ref 5–15)
BUN: 41 mg/dL — ABNORMAL HIGH (ref 8–23)
CO2: 17 mmol/L — ABNORMAL LOW (ref 22–32)
Calcium: 9.4 mg/dL (ref 8.9–10.3)
Chloride: 107 mmol/L (ref 98–111)
Creatinine, Ser: 0.97 mg/dL (ref 0.44–1.00)
GFR calc Af Amer: >60 mL/min (ref >60)
GFR calc non Af Amer: >60 mL/min (ref >60)
Glucose, Bld: 94 mg/dL (ref 70–99)
Potassium: 4.5 mmol/L (ref 3.5–5.1)
Sodium: 130 mmol/L — ABNORMAL LOW (ref 135–145)
Total Bilirubin: 0.9 mg/dL (ref 0.3–1.2)
Total Protein: 6.8 g/dL (ref 6.5–8.1)

## 2019-01-25 LAB — IRON AND TIBC
Iron: 30 ug/dL (ref 28–170)
Saturation Ratios: 15 % (ref 10.4–31.8)
TIBC: 204 ug/dL — ABNORMAL LOW (ref 250–450)
UIBC: 174 ug/dL

## 2019-01-25 LAB — BLOOD CULTURE ID PANEL (REFLEXED)

## 2019-01-25 LAB — URINALYSIS, ROUTINE W REFLEX MICROSCOPIC
Bilirubin Urine: NEGATIVE
Glucose, UA: NEGATIVE mg/dL
Ketones, ur: NEGATIVE mg/dL
Nitrite: NEGATIVE
Protein, ur: NEGATIVE mg/dL
Specific Gravity, Urine: 1.004 — ABNORMAL LOW (ref 1.005–1.030)
WBC, UA: >50 WBC/hpf — ABNORMAL HIGH (ref 0–5)
pH: 6 (ref 5.0–8.0)

## 2019-01-25 LAB — CBC
HCT: 23.7 % — ABNORMAL LOW (ref 36.0–46.0)
Hemoglobin: 7 g/dL — ABNORMAL LOW (ref 12.0–15.0)
MCH: 27.5 pg (ref 26.0–34.0)
MCHC: 29.5 g/dL — ABNORMAL LOW (ref 30.0–36.0)
MCV: 92.9 fL (ref 80.0–100.0)
Platelets: 224 10*3/uL (ref 150–400)
RBC: 2.55 MIL/uL — ABNORMAL LOW (ref 3.87–5.11)
RDW: 16.9 % — ABNORMAL HIGH (ref 11.5–15.5)
WBC: 4.3 10*3/uL (ref 4.0–10.5)
nRBC: 0 % (ref 0.0–0.2)

## 2019-01-25 LAB — GLUCOSE, CAPILLARY: Glucose-Capillary: 85 mg/dL (ref 70–99)

## 2019-01-25 LAB — MRSA PCR SCREENING: MRSA by PCR: POSITIVE — AB

## 2019-01-25 LAB — FERRITIN: Ferritin: 891 ng/mL — ABNORMAL HIGH (ref 11–307)

## 2019-01-25 LAB — ECHOCARDIOGRAM COMPLETE
Height: 64 in
Weight: 1616 oz

## 2019-01-25 LAB — VITAMIN B12: Vitamin B-12: 743 pg/mL (ref 180–914)

## 2019-01-25 MED ORDER — BOOST / RESOURCE BREEZE PO LIQD CUSTOM
1.0000 | Freq: Three times a day (TID) | ORAL | Status: DC
  Administered 2019-01-25 – 2019-02-01 (×21): 1 via ORAL

## 2019-01-25 MED ORDER — ADULT MULTIVITAMIN W/MINERALS CH
1.0000 | ORAL_TABLET | Freq: Every day | ORAL | Status: DC
  Administered 2019-01-25 – 2019-02-01 (×8): 1 via ORAL
  Filled 2019-01-25 (×8): qty 1

## 2019-01-25 MED ORDER — PRO-STAT SUGAR FREE PO LIQD
30.0000 mL | Freq: Three times a day (TID) | ORAL | Status: DC
  Administered 2019-01-25 – 2019-01-30 (×8): 30 mL via ORAL
  Filled 2019-01-25 (×6): qty 30

## 2019-01-25 NOTE — Evaluation (Signed)
Physical Therapy Evaluation Patient Details Name: Katrina Boyer MRN: [id number] DOB: [birth year] Today's Date: 01/25/2019   History of Present Illness  68 y o admitted for bacteremia.  PMH;  crohns, recurrent c diff, fibromyalgia. anxiety and depression  Clinical Impression  Pt admitted from [facility] where she was admitted for rehab in preparation for Colostomy 2* severe Crohns dz.  Pt reports she has not been OOB "for quite some time" 2* onset of back and bil hip pain.  Pt presents with functional mobility limitations 2* generalized weakness, ROM limitations all 4s and significant back and hip pain exacerbated with attempts to mobilize.  Pt would benefit from follow up rehab at SNF level to maximize IND and safety.    Follow Up Recommendations SNF    Equipment Recommendations  None recommended by PT    Recommendations for Other Services       Precautions / Restrictions Precautions Precautions: Fall Restrictions Weight Bearing Restrictions: No      Mobility  Bed Mobility Overal bed mobility: Needs Assistance Bed Mobility: Rolling;Sidelying to Sit Rolling: Mod assist         General bed mobility comments: unable to tolerate sitting up via sidelying 2* increasing back pain  Transfers                    Ambulation/Gait                Stairs            Wheelchair Mobility    Modified Rankin (Stroke Patients Only)       Balance                                             Pertinent Vitals/Pain Pain Assessment: Faces Pain Score: 10-Worst pain ever Faces Pain Scale: Hurts worst Pain Location: back and hips Pain Descriptors / Indicators: Aching Pain Intervention(s): Limited activity within patient's tolerance;Monitored during session;Premedicated before session    Home Living Family/patient expects to be discharged to:: Skilled nursing facility                      Prior Function Level of Independence:  Needs assistance   Gait / Transfers Assistance Needed: Pt states has not been OOB for "some time" following onset of bilat hip and back pain     Comments: has needed extensive assistance with adls since admission to snf  has not been oob     Hand Dominance        Extremity/Trunk Assessment   Upper Extremity Assessment Upper Extremity Assessment: Defer to OT evaluation RUE Deficits / Details: shoulders, bil very weak, cannot lift up against gravity, and painful, L moreso than R.  elbow to fingers, general weakness    Lower Extremity Assessment Lower Extremity Assessment: RLE deficits/detail;LLE deficits/detail RLE Deficits / Details: ~3-/5 strength with AAROM at hip pain limited to 85 flex and 20 abd LLE Deficits / Details: ~3-/5 strength with AAROM at hip pain limited to 95 flex and 15 abd       Communication   Communication: No difficulties  Cognition Arousal/Alertness: Awake/alert Behavior During Therapy: WFL for tasks assessed/performed Overall Cognitive Status: Within Functional Limits for tasks assessed  General Comments      Exercises     Assessment/Plan    PT Assessment Patient needs continued PT services  PT Problem List Decreased strength;Decreased activity tolerance;Decreased range of motion;Decreased mobility;Decreased balance;Decreased knowledge of use of DME;Pain       PT Treatment Interventions DME instruction;Gait training;Functional mobility training;Therapeutic activities;Therapeutic exercise;Balance training;Patient/family education    PT Goals (Current goals can be found in the Care Plan section)  Acute Rehab PT Goals Patient Stated Goal: less pain PT Goal Formulation: With patient Time For Goal Achievement: 02/08/19 Potential to Achieve Goals: Fair    Frequency Min 2X/week   Barriers to discharge        Co-evaluation PT/OT/SLP Co-Evaluation/Treatment: Yes Reason for Co-Treatment:  For patient/therapist safety PT goals addressed during session: Mobility/safety with mobility OT goals addressed during session: Strengthening/ROM       AM-PAC PT "6 Clicks" Mobility  Outcome Measure Help needed turning from your back to your side while in a flat bed without using bedrails?: A Lot Help needed moving from lying on your back to sitting on the side of a flat bed without using bedrails?: Total Help needed moving to and from a bed to a chair (including a wheelchair)?: Total Help needed standing up from a chair using your arms (e.g., wheelchair or bedside chair)?: Total Help needed to walk in hospital room?: Total Help needed climbing 3-5 steps with a railing? : Total 6 Click Score: 7    End of Session   Activity Tolerance: Patient limited by pain Patient left: in bed;with call bell/phone within reach Nurse Communication: Patient requests pain meds PT Visit Diagnosis: Muscle weakness (generalized) (M62.81);Pain;Difficulty in walking, not elsewhere classified (R26.2) Pain - part of body: Hip(bil hips and back)    Time: 2334-3568 PT Time Calculation (min) (ACUTE ONLY): 23 min   Charges:   PT Evaluation $PT Eval Moderate Complexity: [place] Pager [phone] Office [phone]   Shelsie Tijerino 01/25/2019, 1:09 PM

## 2019-01-25 NOTE — Evaluation (Signed)
Occupational Therapy Evaluation Patient Details Name: Katrina Boyer MRN: [id number] DOB: 1950-05-04 Today's Date: 01/25/2019    History of Present Illness 68 y o admitted for bacteremia.  PMH;  crohns, recurrent c diff, fibromyalgia. anxiety and depression   Clinical Impression   Pt was admitted for the above.  She was at [facility] for rehab prior to admission. Pt states she has not been out of bed in a long time; she was able manage drinks better than she can now. She had total A for all other adls, used incontinence brief and rolled to be changed. Will follow in acute setting with the goals listed below.  She will need snf at d/c    Follow Up Recommendations  SNF    Equipment Recommendations  None recommended by OT    Recommendations for Other Services       Precautions / Restrictions Precautions Precautions: Fall Restrictions Weight Bearing Restrictions: No      Mobility Bed Mobility Overal bed mobility: Needs Assistance Bed Mobility: Rolling;Sidelying to Sit Rolling: Mod assist         General bed mobility comments: unable to tolerate sitting up via sidelying  Transfers                      Balance                                           ADL either performed or assessed with clinical judgement   ADL Overall ADL's : Needs assistance/impaired Eating/Feeding: Minimal assistance Eating/Feeding Details (indicate cue type and reason): beverage only Grooming: Moderate assistance;Maximal assistance   Upper Body Bathing: Total assistance   Lower Body Bathing: Total assistance   Upper Body Dressing : Total assistance   Lower Body Dressing: Total assistance                 General ADL Comments: bed level only: pt unable to tolerate sitting up     Vision         Perception     Praxis      Pertinent Vitals/Pain Pain Assessment: Faces Pain Score: 10-Worst pain ever Faces Pain Scale: Hurts worst Pain Location:  back Pain Descriptors / Indicators: Aching Pain Intervention(s): Limited activity within patient's tolerance;Monitored during session;Repositioned     Hand Dominance     Extremity/Trunk Assessment Upper Extremity Assessment Upper Extremity Assessment: RUE deficits/detail;LUE deficits/detail RUE Deficits / Details: shoulders, bil very weak, cannot lift up against gravity, and painful, L moreso than R.  elbow to fingers, general weakness           Communication Communication Communication: No difficulties   Cognition Arousal/Alertness: Awake/alert Behavior During Therapy: WFL for tasks assessed/performed Overall Cognitive Status: Within Functional Limits for tasks assessed                                     General Comments       Exercises     Shoulder Instructions      Home Living Family/patient expects to be discharged to:: Skilled nursing facility                                        Prior  Functioning/Environment Level of Independence: Needs assistance        Comments: has needed extensive assistance with adls since admission to snf  has not been oob        OT Problem List: Decreased strength;Decreased range of motion;Decreased activity tolerance;Impaired balance (sitting and/or standing);Decreased knowledge of use of DME or AE;Pain;Impaired UE functional use      OT Treatment/Interventions: Self-care/ADL training;Therapeutic exercise;DME and/or AE instruction;Therapeutic activities;Patient/family education;Balance training    OT Goals(Current goals can be found in the care plan section) Acute Rehab OT Goals Patient Stated Goal: less pain OT Goal Formulation: With patient Time For Goal Achievement: 02/08/19 Potential to Achieve Goals: Fair ADL Goals Additional ADL Goal #1: pt will roll to bil sides with min A for adls Additional ADL Goal #2: pt will tolerate 5 reps x 2 sets shoulder AROM, and be independent with level one  theraband for bil elbows Additional ADL Goal #3: pt will tolerate sitting EOB x 3 minutes with min A once assisted to sitting in preparation for seated adls  OT Frequency: Min 2X/week   Barriers to D/C:            Co-evaluation PT/OT/SLP Co-Evaluation/Treatment: Yes Reason for Co-Treatment: For patient/therapist safety PT goals addressed during session: Mobility/safety with mobility OT goals addressed during session: Strengthening/ROM      AM-PAC OT "6 Clicks" Daily Activity     Outcome Measure Help from another person eating meals?: A Little Help from another person taking care of personal grooming?: A Lot Help from another person toileting, which includes using toliet, bedpan, or urinal?: Total Help from another person bathing (including washing, rinsing, drying)?: Total Help from another person to put on and taking off regular upper body clothing?: Total Help from another person to put on and taking off regular lower body clothing?: Total 6 Click Score: 9   End of Session    Activity Tolerance: Patient limited by pain Patient left: in bed;with call bell/phone within reach;with bed alarm set  OT Visit Diagnosis: Pain;Muscle weakness (generalized) (M62.81);Feeding difficulties (R63.3) Pain - Right/Left: (bil) Pain - part of body: Shoulder;Hip                Time: 1137-1200 OT Time Calculation (min): 23 min Charges:  OT General Charges $OT Visit: 1 Visit OT Evaluation $OT Eval Moderate Complexity: [place], OTR/L Acute Rehabilitation Services [phone] [facility] pager [phone] office 66/15/2020  [place] 01/25/2019, 12:45 PM

## 2019-01-25 NOTE — Progress Notes (Signed)
PROGRESS NOTE    Katrina Boyer  OIT:[id number] DOB: 1951-03-13 DOA: 01/24/2019 PCP: Seward Carol, MD    Brief Narrative:  68 year old female with severe medically refractory Crohn's disease with fistulas, on TPN and ultimate plan for total colectomy, currently at SNF, recent Pseudomonas bacteremia, MRSA pneumonia who presented from skilled nursing facility to the emergency room on request of her GI physician with positive blood cultures collected on 01/23/2019.  Patient is recovering at a SNF with her previous bacteremia, she had a PICC line, she also has C. difficile and on prolonged vancomycin taper, chronic diarrhea, had routine follow-up due to episode of chills last week, blood cultures drawn that were positive for MRSA and was asked to come to the nearest emergency room.  In the emergency room, patient is hemodynamically stable.  No leukocytosis.  Admitted for management of bacteremia.  Assessment & Plan:   Principal Problem:   Bacteremia Active Problems:   Crohn's disease ([place])   MRSA bacteremia  MRSA bacteremia with history of MRSA pneumonia, indwelling PICC line with TPN: 4 out of 4 bottles positive for MRSA.  Final cultures pending. Hemodynamically stable today. Was started on vancomycin and cefepime, discontinue cefepime.  Vancomycin should cover. Reculture today.  Echocardiogram today. PICC line removed, no cultures were sent from tip. Will be seen by ID. Use peripheral IV line, holding off on PICC line until cultures improve.  Chronic recurrent C. difficile colitis: Patient will stay on vancomycin by mouth, will continue vancomycin by mouth and prolonged taper until she is on antibiotics.  Moderate protein calorie malnutrition: On TPN.  Discontinued due to bacteremia.  Will allow bacteremia to clear before inserting another line.  Allow clear liquid diet and Ensure supplements.  Severe Crohn's disease with fistulas: Follows up at [facility] and surgery.  She is getting  prepared for surgery, waiting for nutrition to improve.  She will follow-up once stable.  Anemia of chronic disease: Hemoglobin 7.  No indication for transfusion today.  We will check her iron and B12 levels.  If iron levels low, will benefit with IV iron while in the hospital.   DVT prophylaxis: Lovenox subcu Code Status: Full code Family Communication: None Disposition Plan: Unknown.  Back to skilled nursing facility.  May need to go back with TPN and IV antibiotics.   Consultants:   None  Procedures:   None  Antimicrobials:   Vancomycin, 01/24/2019---  Cefepime, 01/24/2019--- 01/25/2019   Subjective: Patient seen and examined.  No overnight events.  Afebrile overnight.  Denies any chest pain or shortness of breath.  She is mostly incontinent and has ongoing loose stool.  Denies any abdominal pain. 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  Objective: Vitals:   01/24/19 1500 01/24/19 1551 01/24/19 2129 01/25/19 0542  BP: (!) 121/59 123/68 114/64 104/67  Pulse: 79 79 82 74  Resp: 18 16 19 16   Temp:  98.3 F (36.8 C) 98.1 F (36.7 C) 98.4 F (36.9 C)  TempSrc:  Oral Oral Oral  SpO2: 100% 100% 96% 97%  Weight:      Height:        Intake/Output Summary (Last 24 hours) at 01/25/2019 1143 Last data filed at 01/25/2019 0600 Gross per 24 hour  Intake 1555 ml  Output --  Net 1555 ml   Filed Weights   01/24/19 0239  Weight: 45.8 kg  Examination:  General exam: Appears calm and comfortable, chronically sick looking.  On room air. Respiratory system: Clear to auscultation. Respiratory effort normal.  No added sounds Cardiovascular system: S1 & S2 heard, RRR. No JVD, murmurs, rubs, gallops or clicks. No pedal edema. Gastrointestinal system: Abdomen is nondistended, soft and nontender. No organomegaly or masses felt. Normal bowel sounds  heard. Central nervous system: Alert and oriented. No focal neurological deficits. Extremities: Symmetric 5 x 5 power. Skin: No rashes, lesions or ulcers Psychiatry: Judgement and insight appear normal. Mood & affect appropriate.     Data Reviewed: I have personally reviewed following labs and imaging studies  CBC: Recent Labs  Lab 01/24/19 0253 01/25/19 0836  WBC 6.7 4.3  NEUTROABS 4.9  --   HGB 7.6* 7.0*  HCT 25.4* 23.7*  MCV 92.0 92.9  PLT 234 678   Basic Metabolic Panel: Recent Labs  Lab 01/24/19 0253 01/25/19 0836  NA 129* 130*  K 4.6 4.5  CL 101 107  CO2 19* 17*  GLUCOSE 102* 94  BUN 57* 41*  CREATININE 0.97 0.97  CALCIUM 9.4 9.4   GFR: Estimated Creatinine Clearance: 41.8 mL/min (by C-G formula based on SCr of 0.97 mg/dL). Liver Function Tests: Recent Labs  Lab 01/24/19 0253 01/25/19 0836  AST 52* 35  ALT 66* 47*  ALKPHOS 169* 139*  BILITOT 0.9 0.9  PROT 7.5 6.8  ALBUMIN 2.4* 2.1*   No results for input(s): LIPASE, AMYLASE in the last 168 hours. No results for input(s): AMMONIA in the last 168 hours. Coagulation Profile: No results for input(s): INR, PROTIME in the last 168 hours. Cardiac Enzymes: No results for input(s): CKTOTAL, CKMB, CKMBINDEX, TROPONINI in the last 168 hours. BNP (last 3 results) No results for input(s): PROBNP in the last 8760 hours. HbA1C: No results for input(s): HGBA1C in the last 72 hours. CBG: Recent Labs  Lab 01/25/19 0740  GLUCAP 85   Lipid Profile: No results for input(s): CHOL, HDL, LDLCALC, TRIG, CHOLHDL, LDLDIRECT in the last 72 hours. Thyroid Function Tests: No results for input(s): TSH, T4TOTAL, FREET4, T3FREE, THYROIDAB in the last 72 hours. Anemia Panel: No results for input(s): VITAMINB12, FOLATE, FERRITIN, TIBC, IRON, RETICCTPCT in the last 72 hours. Sepsis Labs: Recent Labs  Lab 01/24/19 0411  LATICACIDVEN 1.0    Recent Results (from the past 240 hour(s))  SARS Coronavirus 2 by RT PCR  (hospital order, performed in [facility] hospital lab) Nasopharyngeal Nasopharyngeal Swab     Status: None   Collection Time: 01/24/19  4:11 AM   Specimen: Nasopharyngeal Swab  Result Value Ref Range Status   SARS Coronavirus 2 NEGATIVE NEGATIVE Final    Comment: (NOTE) If result is NEGATIVE SARS-CoV-2 target nucleic acids are NOT DETECTED. The SARS-CoV-2 RNA is generally detectable in upper and lower  respiratory specimens during the acute phase of infection. The lowest  concentration of SARS-CoV-2 viral copies this assay can detect is 250  copies / mL. A negative result does not preclude SARS-CoV-2 infection  and should not be used as the sole basis for treatment or other  patient management decisions.  A negative result may occur with  improper specimen collection / handling, submission of specimen other  than nasopharyngeal swab, presence of viral mutation(s) within the  areas targeted by this assay, and inadequate number of viral copies  (<250 copies / mL). A negative result must be combined with clinical  observations, patient history, and epidemiological information. If result is POSITIVE SARS-CoV-2 target nucleic acids  are DETECTED. The SARS-CoV-2 RNA is generally detectable in upper and lower  respiratory specimens dur ing the acute phase of infection.  Positive  results are indicative of active infection with SARS-CoV-2.  Clinical  correlation with patient history and other diagnostic information is  necessary to determine patient infection status.  Positive results do  not rule out bacterial infection or co-infection with other viruses. If result is PRESUMPTIVE POSTIVE SARS-CoV-2 nucleic acids MAY BE PRESENT.   A presumptive positive result was obtained on the submitted specimen  and confirmed on repeat testing.  While 2019 novel coronavirus  (SARS-CoV-2) nucleic acids may be present in the submitted sample  additional confirmatory testing may be necessary for  epidemiological  and / or clinical management purposes  to differentiate between  SARS-CoV-2 and other Sarbecovirus currently known to infect humans.  If clinically indicated additional testing with an alternate test  methodology [phone]) is advised. The SARS-CoV-2 RNA is generally  detectable in upper and lower respiratory sp ecimens during the acute  phase of infection. The expected result is Negative. Fact Sheet for Patients:  StrictlyIdeas.no Fact Sheet for Healthcare Providers: BankingDealers.co.za This test is not yet approved or cleared by the [place] FDA and has been authorized for detection and/or diagnosis of SARS-CoV-2 by FDA under an Emergency Use Authorization (EUA).  This EUA will remain in effect (meaning this test can be used) for the duration of the COVID-19 declaration under Section 564(b)(1) of the Act, 21 U.S.C. section 360bbb-3(b)(1), unless the authorization is terminated or revoked sooner. Performed at [facility], [place] [address], [place], [place] [address]   Blood culture (routine x 2)     Status: None (Preliminary result)   Collection Time: 01/24/19  4:12 AM   Specimen: BLOOD  Result Value Ref Range Status   Specimen Description   Final    BLOOD LEFT ANTECUBITAL Performed at [facility] [address], [place], [place] [address]    Special Requests   Final    BOTTLES DRAWN AEROBIC AND ANAEROBIC Blood Culture adequate volume Performed at [facility] [address], [place], [place] [address]    Culture  Setup Time   Final    GRAM POSITIVE COCCI IN BOTH AEROBIC AND ANAEROBIC BOTTLES CRITICAL VALUE NOTED.  VALUE IS CONSISTENT WITH PREVIOUSLY REPORTED AND CALLED VALUE.    Culture   Final    NO GROWTH 1 DAY Performed at [facility], [place] [address], [place], [place] [address]    Report Status PENDING  Incomplete  Blood culture  (routine x 2)     Status: None (Preliminary result)   Collection Time: 01/24/19  4:15 AM   Specimen: BLOOD  Result Value Ref Range Status   Specimen Description   Final    BLOOD BLOOD RIGHT FOREARM Performed at [facility] [address] [place], [place] [address]    Special Requests   Final    BOTTLES DRAWN AEROBIC AND ANAEROBIC Blood Culture adequate volume Performed at [facility] [address] [place],  [address]    Culture  Setup Time   Final    GRAM POSITIVE COCCI IN BOTH AEROBIC AND ANAEROBIC BOTTLES Organism ID to follow CRITICAL RESULT CALLED TO, READ BACK BY AND VERIFIED WITH: PHRMD L GREEN @ [id number] 01/25/19 BY S GEZAHEGN    Culture   Final    NO GROWTH 1 DAY Performed at [facility], [place]  [address] [place], [place] [address]    Report Status PENDING  Incomplete  Blood Culture ID Panel (Reflexed)     Status: Abnormal   Collection Time: 01/24/19  4:15 AM  Result Value Ref Range Status   Enterococcus species NOT DETECTED NOT DETECTED Final   Listeria monocytogenes NOT DETECTED NOT DETECTED Final   Staphylococcus species DETECTED (A) NOT DETECTED Final    Comment: Methicillin (oxacillin) resistant coagulase negative staphylococcus. Possible blood culture contaminant (unless isolated from more than one blood culture draw or clinical case suggests pathogenicity). No antibiotic treatment is indicated for blood  culture contaminants. CRITICAL RESULT CALLED TO, READ BACK BY AND VERIFIED WITH: PHRMD L GREEN @ [id number] 01/25/19 BY S GEZAHEGN    Staphylococcus aureus (BCID) NOT DETECTED NOT DETECTED Final   Methicillin resistance DETECTED (A) NOT DETECTED Final    Comment: CRITICAL RESULT CALLED TO, READ BACK BY AND VERIFIED WITH: PHRMD L GREEN @ 0272 01/25/19 BY S GEZAHEGN    Streptococcus species NOT DETECTED NOT DETECTED Final   Streptococcus agalactiae NOT DETECTED NOT DETECTED Final   Streptococcus pneumoniae NOT  DETECTED NOT DETECTED Final   Streptococcus pyogenes NOT DETECTED NOT DETECTED Final   Acinetobacter baumannii NOT DETECTED NOT DETECTED Final   Enterobacteriaceae species NOT DETECTED NOT DETECTED Final   Enterobacter cloacae complex NOT DETECTED NOT DETECTED Final   Escherichia coli NOT DETECTED NOT DETECTED Final   Klebsiella oxytoca NOT DETECTED NOT DETECTED Final   Klebsiella pneumoniae NOT DETECTED NOT DETECTED Final   Proteus species NOT DETECTED NOT DETECTED Final   Serratia marcescens NOT DETECTED NOT DETECTED Final   Haemophilus influenzae NOT DETECTED NOT DETECTED Final   Neisseria meningitidis NOT DETECTED NOT DETECTED Final   Pseudomonas aeruginosa NOT DETECTED NOT DETECTED Final   Candida albicans NOT DETECTED NOT DETECTED Final   Candida glabrata NOT DETECTED NOT DETECTED Final   Candida krusei NOT DETECTED NOT DETECTED Final   Candida parapsilosis NOT DETECTED NOT DETECTED Final   Candida tropicalis NOT DETECTED NOT DETECTED Final    Comment: Performed at [facility], [place]. [address], [place], [place] [address]         Radiology Studies: Dg Chest 2 View  Result Date: 01/24/2019 CLINICAL DATA:  Positive blood cultures EXAM: CHEST - 2 VIEW COMPARISON:  None. FINDINGS: No acute airspace disease or effusion. Right upper extremity venous catheter tip over the brachiocephalic region. Normal heart size. No pneumothorax. Suspect skin fold artifact over the left upper chest IMPRESSION: 1. Right upper extremity catheter tip overlies the brachiocephalic region 2. No focal airspace disease Electronically Signed   By: Donavan Foil M.D.   On: 01/24/2019 04:30   Dg Lumbar Spine Complete  Result Date: 01/24/2019 CLINICAL DATA:  Chronic pain EXAM: LUMBAR SPINE - COMPLETE 4+ VIEW COMPARISON:  None. FINDINGS: Five non rib-bearing lumbar type vertebra. Aortic atherosclerosis. Suspected transitional anatomy. For the purposes of reporting, first non rib-bearing lumbar vertebra will  be designated L1. Alignment within normal limits. Treated compression deformity at L4. Mild to moderate degenerative changes at L3-L4, L4-L5 and L5-S1. IMPRESSION: No acute osseous abnormality. Mild to moderate diffuse degenerative change of the lower lumbar spine. Electronically Signed   By: Donavan Foil M.D.   On: 01/24/2019 04:08   Ct Lumbar Spine W Contrast  Result Date: 01/24/2019 CLINICAL DATA:  Bacteremia. Chronic hip pain. EXAM: CT LUMBAR SPINE WITH CONTRAST TECHNIQUE: Multidetector CT imaging of the lumbar spine was performed with intravenous contrast administration. CONTRAST:  42m OMNIPAQUE IOHEXOL 300 MG/ML  SOLN COMPARISON:  Lumbar radiographs dated 01/24/2019 FINDINGS: Segmentation: 6 non-rib-bearing vertebra in the lumbar spine. I will term the first non-rib-bearing vertebra as L1 with lumbarization of S1. The left transverse process of S1 is fused with the remainder of the sacrum. Alignment: Normal. Vertebrae: There is an old mild compression fracture of L4 treated with vertebroplasty. Paraspinal and other soft tissues: There is slight enhancement of the mucosa of the left renal pelvis and of the left ureter. The possibility of urinary tract infection should be considered. Aortic atherosclerosis. Several stones in the right kidney including 1 measuring 7 mm. Tiny stone in the upper pole of the left kidney. Large left renal cyst measuring at least 8 cm in diameter. 16 mm cyst on the upper pole of the right kidney. Disc levels: L1-2: Normal. L2-3: Normal. L3-4: Small broad-based disc bulge with no neural impingement. Old healed mild compression fracture of the superior endplate of L3, treated with vertebroplasty. L4-5: Normal disc. Moderate right and mild left facet arthritis. L5-S1: Disc degeneration with a vacuum phenomenon and degenerative changes of the vertebral endplates to the left of midline. Tiny broad-based disc bulge without neural impingement. No foraminal stenosis. Mild right and  moderately severe left facet arthritis. S1-2: Vestigial disc. No neural impingement. IMPRESSION: 1. Slight enhancement of the mucosa of the left renal pelvis and of the left ureter. The possibility of urinary tract infection should be considered. 2. Old healed mild compression fractures of L4. 3. Multilevel degenerative facet arthritis in the lumbar spine. Electronically Signed   By: JLorriane ShireM.D.   On: 01/24/2019 12:08   Dg Hips Bilat W Or Wo Pelvis 3-4 Views  Result Date: 01/24/2019 CLINICAL DATA:  Chronic hip and low back pain EXAM: DG HIP (WITH OR WITHOUT PELVIS) 3-4V BILAT COMPARISON:  None. FINDINGS: Treated compression deformity in the lower spine. The SI joints are non widened. The pubic symphysis and rami are intact. No fracture or malalignment. Joint spaces are relatively preserved. IMPRESSION: Negative. Electronically Signed   By: KDonavan FoilM.D.   On: 01/24/2019 04:06        Scheduled Meds:  buPROPion  300 mg Oral Daily   enoxaparin (LOVENOX) injection  30 mg Subcutaneous Q24H   escitalopram  5 mg Oral Daily   lidocaine  1 patch Transdermal Q24H   Melatonin  3 mg Oral QHS   pantoprazole  40 mg Oral Daily   pravastatin  20 mg Oral Daily   saccharomyces boulardii  250 mg Oral BID   vancomycin  125 mg Oral QID   Continuous Infusions:  sodium chloride 100 mL/hr at 01/25/19 0600   vancomycin 500 mg (01/25/19 0650)     LOS: 0 days    Time spent: 35 minutes    KBarb Merino MD [facility]

## 2019-01-25 NOTE — Progress Notes (Signed)
Initial Nutrition Assessment  RD working remotely.   DOCUMENTATION CODES:   Underweight  INTERVENTION:  - will order Boost Breeze TID, each supplement provides 250 kcal and 9 grams of protein. - will order 30 mL Prostat TID, each supplement provides 100 kcal and 15 grams of protein. - will order daily multivitamin with minerals. - continue to encourage PO intakes and will monitor for ability to resume TPN.    NUTRITION DIAGNOSIS:   Inadequate protein intake related to other (see comment)(current diet order) as evidenced by other (comment)(CLD does not meet estimated protein need.).  GOAL:   Patient will meet greater than or equal to 90% of their needs  MONITOR:   PO intake, Supplement acceptance, Diet advancement, Labs, Weight trends, I & O's, Other (Comment)(ability to resume TPN)  REASON FOR ASSESSMENT:   Malnutrition Screening Tool, Consult Assessment of nutrition requirement/status  ASSESSMENT:   68 year old female with medical history of anxiety, recurrent C.diff, and severe refractory Crohn's disease with fistulas. She has been on TPN and resides at [facility]. Ultimate plan is for total colectomy pending nutrition optimization. She was recent diagnosed with pseudomonas bacteremia and MRSA PNA. She presented to the ED from SNF on 10/14 at the request of outpatient GI MD d/t positive blood cultures for MRSA collected on 10/13. Patient has PICC and is currently positive for C.diff and is on prolonged vancomycin taper. She was admitted for management of bacteremia.  Current weight is 101 lb. Weight on 11/14/18 at [place] was 97 lb and weight on 01/23/19 at [facility] was 101 lb. She has been on CLD since admission with no documented intakes. PICC was removed yesterday AM and she currently only has PIV access. TPN on hold d/t this. PICC was removed d/t bacteremia.   Will monitor for ability to regain PICC vs central line access and resume TPN. Patient is on TPN and CLD  at SNF with plan for nutrition optimization prior to planned total colectomy 2/2 severe Crohn's disease.   Suspect some degree of malnutrition, but unable to confirm without completing NFPE.  Per notes: - MRSA bacteremia with hx of MRSA PNA--s/p removal of PICC, holding off on replacement until cultures improve - chronic recurrent C.diff colitis--prolonged vancomycin taper - moderate PCM - severe Crohn's disease with fistulas--follows with GI at [facility] - anemia of chronic disease   Labs reviewed; Na: 130 mmol/l, BUN: 41 mg/dl. Medications reviewed; 3 mg melatonin/night, 40 mg oral protonix/day, 250 mg florastor BID. IVF; NS @ 100 ml/hr.      NUTRITION - FOCUSED PHYSICAL EXAM:  unable to complete at this time.   Diet Order:   Diet Order            Diet clear liquid Room service appropriate? Yes; Fluid consistency: Thin  Diet effective now              EDUCATION NEEDS:   Not appropriate for education at this time  Skin:  Skin Assessment: Reviewed RN Assessment  Last BM:  10/14 (type 6)  Height:   Ht Readings from Last 1 Encounters:  01/24/19 5' 4"  (1.626 m)    Weight:   Wt Readings from Last 1 Encounters:  01/24/19 45.8 kg    Ideal Body Weight:  54.5 kg  BMI:  Body mass index is 17.34 kg/m.  Estimated Nutritional Needs:   Kcal:  1605-1840 kcal  Protein:  80-95 grams  Fluid:  >/= 2.2 L/day      Jarome Matin, MS,  RD, LDN, CNSC Inpatient Clinical Dietitian Pager # [phone] After hours/weekend pager # [phone]

## 2019-01-25 NOTE — Progress Notes (Signed)
PHARMACY - PHYSICIAN COMMUNICATION CRITICAL VALUE ALERT - BLOOD CULTURE IDENTIFICATION (BCID)  Katrina Boyer is an 68 y.o. female who presented to [facility] on 01/24/2019 with a chief complaint of bacteremia.  Assessment:   (include suspected source if known) 4 of 4 bottles GPR- staph species, MecA+ Name of physician (or Provider) Contacted: Lamar Blinks, NP  Current antibiotics: Cefepime and Vancomycin  Changes to prescribed antibiotics recommended:  Patient is on recommended antibiotics - No changes needed  Consider narrowing to vancomycin when clinically appropriate  Results for orders placed or performed during the hospital encounter of 01/24/19  Blood Culture ID Panel (Reflexed) (Collected: 01/24/2019  4:15 AM)  Result Value Ref Range   Enterococcus species NOT DETECTED NOT DETECTED   Listeria monocytogenes NOT DETECTED NOT DETECTED   Staphylococcus species DETECTED (A) NOT DETECTED   Staphylococcus aureus (BCID) NOT DETECTED NOT DETECTED   Methicillin resistance DETECTED (A) NOT DETECTED   Streptococcus species NOT DETECTED NOT DETECTED   Streptococcus agalactiae NOT DETECTED NOT DETECTED   Streptococcus pneumoniae NOT DETECTED NOT DETECTED   Streptococcus pyogenes NOT DETECTED NOT DETECTED   Acinetobacter baumannii NOT DETECTED NOT DETECTED   Enterobacteriaceae species NOT DETECTED NOT DETECTED   Enterobacter cloacae complex NOT DETECTED NOT DETECTED   Escherichia coli NOT DETECTED NOT DETECTED   Klebsiella oxytoca NOT DETECTED NOT DETECTED   Klebsiella pneumoniae NOT DETECTED NOT DETECTED   Proteus species NOT DETECTED NOT DETECTED   Serratia marcescens NOT DETECTED NOT DETECTED   Haemophilus influenzae NOT DETECTED NOT DETECTED   Neisseria meningitidis NOT DETECTED NOT DETECTED   Pseudomonas aeruginosa NOT DETECTED NOT DETECTED   Candida albicans NOT DETECTED NOT DETECTED   Candida glabrata NOT DETECTED NOT DETECTED   Candida krusei NOT DETECTED NOT DETECTED   Candida  parapsilosis NOT DETECTED NOT DETECTED   Candida tropicalis NOT DETECTED NOT DETECTED    Dorrene German 01/25/2019  6:38 AM

## 2019-01-25 NOTE — Progress Notes (Signed)
  Echocardiogram 2D Echocardiogram has been performed.  Katrina Boyer 01/25/2019, 3:05 PM

## 2019-01-26 DIAGNOSIS — Z8614 Personal history of Methicillin resistant Staphylococcus aureus infection: Secondary | ICD-10-CM

## 2019-01-26 DIAGNOSIS — Z8619 Personal history of other infectious and parasitic diseases: Secondary | ICD-10-CM

## 2019-01-26 DIAGNOSIS — D649 Anemia, unspecified: Secondary | ICD-10-CM

## 2019-01-26 DIAGNOSIS — Z87891 Personal history of nicotine dependence: Secondary | ICD-10-CM

## 2019-01-26 DIAGNOSIS — E46 Unspecified protein-calorie malnutrition: Secondary | ICD-10-CM

## 2019-01-26 DIAGNOSIS — B9561 Methicillin susceptible Staphylococcus aureus infection as the cause of diseases classified elsewhere: Secondary | ICD-10-CM

## 2019-01-26 DIAGNOSIS — Z8701 Personal history of pneumonia (recurrent): Secondary | ICD-10-CM

## 2019-01-26 DIAGNOSIS — R7881 Bacteremia: Secondary | ICD-10-CM

## 2019-01-26 DIAGNOSIS — K50913 Crohn's disease, unspecified, with fistula: Secondary | ICD-10-CM

## 2019-01-26 DIAGNOSIS — Z9989 Dependence on other enabling machines and devices: Secondary | ICD-10-CM

## 2019-01-26 LAB — COMPREHENSIVE METABOLIC PANEL
ALT: 38 U/L (ref 0–44)
AST: 30 U/L (ref 15–41)
Albumin: 2 g/dL — ABNORMAL LOW (ref 3.5–5.0)
Alkaline Phosphatase: 127 U/L — ABNORMAL HIGH (ref 38–126)
Anion gap: 7 (ref 5–15)
BUN: 27 mg/dL — ABNORMAL HIGH (ref 8–23)
CO2: 15 mmol/L — ABNORMAL LOW (ref 22–32)
Calcium: 9.4 mg/dL (ref 8.9–10.3)
Chloride: 111 mmol/L (ref 98–111)
Creatinine, Ser: 0.78 mg/dL (ref 0.44–1.00)
GFR calc Af Amer: >60 mL/min (ref >60)
GFR calc non Af Amer: >60 mL/min (ref >60)
Glucose, Bld: 94 mg/dL (ref 70–99)
Potassium: 3.9 mmol/L (ref 3.5–5.1)
Sodium: 133 mmol/L — ABNORMAL LOW (ref 135–145)
Total Bilirubin: 0.3 mg/dL (ref 0.3–1.2)
Total Protein: 6.4 g/dL — ABNORMAL LOW (ref 6.5–8.1)

## 2019-01-26 LAB — HEMOGLOBIN AND HEMATOCRIT, BLOOD
HCT: 30.7 % — ABNORMAL LOW (ref 36.0–46.0)
Hemoglobin: 9.2 g/dL — ABNORMAL LOW (ref 12.0–15.0)

## 2019-01-26 LAB — CBC WITH DIFFERENTIAL/PLATELET
Abs Immature Granulocytes: 0.02 10*3/uL (ref 0.00–0.07)
Basophils Absolute: 0 10*3/uL (ref 0.0–0.1)
Basophils Relative: 0 %
Eosinophils Absolute: 0 10*3/uL (ref 0.0–0.5)
Eosinophils Relative: 0 %
HCT: 22.6 % — ABNORMAL LOW (ref 36.0–46.0)
Hemoglobin: 6.9 g/dL — CL (ref 12.0–15.0)
Immature Granulocytes: 1 %
Lymphocytes Relative: 33 %
Lymphs Abs: 1.1 10*3/uL (ref 0.7–4.0)
MCH: 28.3 pg (ref 26.0–34.0)
MCHC: 30.5 g/dL (ref 30.0–36.0)
MCV: 92.6 fL (ref 80.0–100.0)
Monocytes Absolute: 0.3 10*3/uL (ref 0.1–1.0)
Monocytes Relative: 8 %
Neutro Abs: 2 10*3/uL (ref 1.7–7.7)
Neutrophils Relative %: 58 %
Platelets: 199 10*3/uL (ref 150–400)
RBC: 2.44 MIL/uL — ABNORMAL LOW (ref 3.87–5.11)
RDW: 16.6 % — ABNORMAL HIGH (ref 11.5–15.5)
WBC: 3.4 10*3/uL — ABNORMAL LOW (ref 4.0–10.5)
nRBC: 0 % (ref 0.0–0.2)

## 2019-01-26 LAB — PHOSPHORUS: Phosphorus: 3.9 mg/dL (ref 2.5–4.6)

## 2019-01-26 LAB — GLUCOSE, CAPILLARY: Glucose-Capillary: 89 mg/dL (ref 70–99)

## 2019-01-26 LAB — FOLATE RBC
Folate, Hemolysate: 520 ng/mL
Folate, RBC: 2241 ng/mL (ref >498)
Hematocrit: 23.2 % — ABNORMAL LOW (ref 34.0–46.6)

## 2019-01-26 LAB — MAGNESIUM: Magnesium: 1.8 mg/dL (ref 1.7–2.4)

## 2019-01-26 LAB — PREPARE RBC (CROSSMATCH)

## 2019-01-26 MED ORDER — SODIUM CHLORIDE 0.9% IV SOLUTION
Freq: Once | INTRAVENOUS | Status: AC
  Administered 2019-01-26: 10:00:00 via INTRAVENOUS

## 2019-01-26 MED ORDER — VANCOMYCIN HCL IN DEXTROSE 750-5 MG/150ML-% IV SOLN
750.0000 mg | INTRAVENOUS | Status: DC
  Administered 2019-01-27 – 2019-01-31 (×5): 750 mg via INTRAVENOUS
  Filled 2019-01-26 (×5): qty 150

## 2019-01-26 MED ORDER — METHOCARBAMOL 500 MG PO TABS
500.0000 mg | ORAL_TABLET | Freq: Four times a day (QID) | ORAL | Status: DC | PRN
  Administered 2019-01-26 – 2019-02-01 (×16): 500 mg via ORAL
  Filled 2019-01-26 (×16): qty 1

## 2019-01-26 NOTE — Progress Notes (Signed)
Pharmacy Antibiotic Note  Katrina Boyer is a 68 y.o. female with PICC and on TPN PTA, presented to the ED on 01/24/2019 for workup and management of "staph" noted from outside blood cultures. PICC removed on 10/14. She was started on vancomycin on admission.  Today, 01/26/2019: - Tmax 99, wbc low - scr 0.78 (crcl~50) - bcx with staph epi (meth resistance) -  10/15 echo: no vegetation noted   Plan: - adjust vancomycin dose to 750 mg IV q24h for est AUC 485 - monitor renal function closely - f/u with cx ___________________________________  Height: 5' 4"  (162.6 cm) Weight: 101 lb (45.8 kg) IBW/kg (Calculated) : 54.7  Temp (24hrs), Avg:98.5 F (36.9 C), Min:98 F (36.7 C), Max:99 F (37.2 C)  Recent Labs  Lab 01/24/19 0253 01/24/19 0411 01/25/19 0836 01/26/19 0531  WBC 6.7  --  4.3 3.4*  CREATININE 0.97  --  0.97 0.78  LATICACIDVEN  --  1.0  --   --     Estimated Creatinine Clearance: 50.7 mL/min (by C-G formula based on SCr of 0.78 mg/dL).    Allergies  Allergen Reactions  . Piperacillin-Tazobactam In Dex     Thrombocytopenia   . Metronidazole     neuropathy  . Gabapentin   . Mercaptopurine Nausea Only  . Ciprofloxacin Rash    Antimicrobials this admission: 9/8 vanc PO (chronic recurrent cdiff colitis)>> resumed from home 10/14 Vancomycin >>  10/14 Cefepime >> 10/15  Microbiology results: BCx drawn [facility] GI office reported per note as 1 of 2 GPC  10/14 bcx x2 (drawn after abx given): 2/2 staph epi (meth resistance) 10/15 ucx: 10/15 bcx x2: 10/15 MRSA PCR: positive  Thank you for allowing pharmacy to be a part of this patient's care.  Lynelle Doctor 01/26/2019 1:04 PM

## 2019-01-26 NOTE — Progress Notes (Signed)
PROGRESS NOTE    Katrina Boyer  NWG:[id number] DOB: 04-05-1951 DOA: 01/24/2019 PCP: Seward Carol, MD    Brief Narrative:  68 year old female with severe medically refractory Crohn's disease with fistulas, on TPN and ultimate plan for total colectomy, currently at SNF, recent Pseudomonas bacteremia, MRSA pneumonia who presented from skilled nursing facility to the emergency room on request of her GI physician with positive blood cultures collected on 01/23/2019.  Patient is recovering at a SNF with her previous bacteremia, she had a PICC line, she also has C. difficile and on prolonged vancomycin taper, chronic diarrhea, had routine follow-up due to episode of chills last week, blood cultures drawn that were positive for MRSE and was asked to come to the nearest emergency room.  In the emergency room, patient is hemodynamically stable.  No leukocytosis.  Admitted for management of bacteremia.  Assessment & Plan:   Principal Problem:   Bacteremia Active Problems:   Crohn's disease ([place])   MRSA bacteremia  MRSE bacteremia due to presence of PICC line. indwelling PICC line with TPN: 4 out of 4 bottles positive for MRSE. Hemodynamically stable today. On vancomycin  Repeat blood cultures no growth so far from 01/25/2019. TTE, no vegetation. use peripheral IV line, holding off on PICC line until cultures improve. Appreciate infectious disease input.  Chronic recurrent C. difficile colitis: Patient will stay on vancomycin by mouth, will continue vancomycin by mouth and prolonged taper until she is on antibiotics.  Moderate protein calorie malnutrition: On TPN.  Discontinued due to bacteremia.  Will allow bacteremia to clear before inserting another line.  Will allow regular diet and supplements.  Severe Crohn's disease with fistulas: Follows up at [place] and surgery.  She is getting prepared for surgery, waiting for nutrition to improve.  She will follow-up once stable.  Anemia of chronic  disease: Hemoglobin 6.9.  Received 1 unit of PRBC today.  We will continue close monitoring.  Iron levels, ferritin and B12 is normal.   DVT prophylaxis: Lovenox subcu Code Status: Full code Family Communication: None, patient stated that she will talk to her son. Disposition Plan: Unknown.  Back to skilled nursing facility.  May need to go back with TPN and IV antibiotics.   Consultants:   Infectious disease  Procedures:   None  Antimicrobials:   Vancomycin, 01/24/2019---  Cefepime, 01/24/2019--- 01/25/2019   Subjective: Patient seen and examined.  No overnight events.  Afebrile overnight.  Denies any nausea vomiting or abdominal pain. Patient states that she has back pain and leg pain, would like to try more frequent Robaxin. Patient was on clear liquid diet for preparation of surgery, she does not have enough oral intake, will liberalize to regular diet and monitor.  No diarrhea since yesterday.  Objective: Vitals:   01/26/19 0634 01/26/19 1025 01/26/19 1042 01/26/19 1359  BP: 126/70 138/63 (!) 144/68 (!) 143/70  Pulse: 74 77 81 80  Resp: 16 17 18 20   Temp: 98.7 F (37.1 C) 98 F (36.7 C) 98.3 F (36.8 C) 98.7 F (37.1 C)  TempSrc: Oral Oral Oral Oral  SpO2: 96% 100% 100% 98%  Weight:      Height:        Intake/Output Summary (Last 24 hours) at 01/26/2019 1411 Last data filed at 01/26/2019 1359 Gross per 24 hour  Intake 1424 ml  Output -  Net 1424 ml   Filed Weights   01/24/19 0239  Weight: 45.8 kg    Examination:  General exam: Appears calm and  comfortable, chronically sick looking.  On room air. Respiratory system: Clear to auscultation. Respiratory effort normal.  No added sounds Cardiovascular system: S1 & S2 heard, RRR. No JVD, murmurs, rubs, gallops or clicks. No pedal edema. Gastrointestinal system: Abdomen is nondistended, soft and nontender. No organomegaly or masses felt. Normal bowel sounds heard. Central nervous system: Alert and oriented.  No focal neurological deficits. Extremities: Symmetric 5 x 5 power. Skin: No rashes, lesions or ulcers Psychiatry: Judgement and insight appear normal. Mood & affect appropriate.     Data Reviewed: I have personally reviewed following labs and imaging studies  CBC: Recent Labs  Lab 01/24/19 0253 01/25/19 0836 01/26/19 0531  WBC 6.7 4.3 3.4*  NEUTROABS 4.9  --  2.0  HGB 7.6* 7.0* 6.9*  HCT 25.4* 23.7* 22.6*  MCV 92.0 92.9 92.6  PLT 234 224 425   Basic Metabolic Panel: Recent Labs  Lab 01/24/19 0253 01/25/19 0836 01/26/19 0531  NA 129* 130* 133*  K 4.6 4.5 3.9  CL 101 107 111  CO2 19* 17* 15*  GLUCOSE 102* 94 94  BUN 57* 41* 27*  CREATININE 0.97 0.97 0.78  CALCIUM 9.4 9.4 9.4  MG  --   --  1.8  PHOS  --   --  3.9   GFR: Estimated Creatinine Clearance: 50.7 mL/min (by C-G formula based on SCr of 0.78 mg/dL). Liver Function Tests: Recent Labs  Lab 01/24/19 0253 01/25/19 0836 01/26/19 0531  AST 52* 35 30  ALT 66* 47* 38  ALKPHOS 169* 139* 127*  BILITOT 0.9 0.9 0.3  PROT 7.5 6.8 6.4*  ALBUMIN 2.4* 2.1* 2.0*   No results for input(s): LIPASE, AMYLASE in the last 168 hours. No results for input(s): AMMONIA in the last 168 hours. Coagulation Profile: No results for input(s): INR, PROTIME in the last 168 hours. Cardiac Enzymes: No results for input(s): CKTOTAL, CKMB, CKMBINDEX, TROPONINI in the last 168 hours. BNP (last 3 results) No results for input(s): PROBNP in the last 8760 hours. HbA1C: No results for input(s): HGBA1C in the last 72 hours. CBG: Recent Labs  Lab 01/25/19 0740 01/26/19 0909  GLUCAP 85 89   Lipid Profile: No results for input(s): CHOL, HDL, LDLCALC, TRIG, CHOLHDL, LDLDIRECT in the last 72 hours. Thyroid Function Tests: No results for input(s): TSH, T4TOTAL, FREET4, T3FREE, THYROIDAB in the last 72 hours. Anemia Panel: Recent Labs    01/25/19 1349  VITAMINB12 743  FERRITIN 891*  TIBC 204*  IRON 30   Sepsis Labs: Recent Labs   Lab 01/24/19 0411  LATICACIDVEN 1.0    Recent Results (from the past 240 hour(s))  SARS Coronavirus 2 by RT PCR (hospital order, performed in [facility] hospital lab) Nasopharyngeal Nasopharyngeal Swab     Status: None   Collection Time: 01/24/19  4:11 AM   Specimen: Nasopharyngeal Swab  Result Value Ref Range Status   SARS Coronavirus 2 NEGATIVE NEGATIVE Final    Comment: (NOTE) If result is NEGATIVE SARS-CoV-2 target nucleic acids are NOT DETECTED. The SARS-CoV-2 RNA is generally detectable in upper and lower  respiratory specimens during the acute phase of infection. The lowest  concentration of SARS-CoV-2 viral copies this assay can detect is 250  copies / mL. A negative result does not preclude SARS-CoV-2 infection  and should not be used as the sole basis for treatment or other  patient management decisions.  A negative result may occur with  improper specimen collection / handling, submission of specimen other  than nasopharyngeal swab,  presence of viral mutation(s) within the  areas targeted by this assay, and inadequate number of viral copies  (<250 copies / mL). A negative result must be combined with clinical  observations, patient history, and epidemiological information. If result is POSITIVE SARS-CoV-2 target nucleic acids are DETECTED. The SARS-CoV-2 RNA is generally detectable in upper and lower  respiratory specimens dur ing the acute phase of infection.  Positive  results are indicative of active infection with SARS-CoV-2.  Clinical  correlation with patient history and other diagnostic information is  necessary to determine patient infection status.  Positive results do  not rule out bacterial infection or co-infection with other viruses. If result is PRESUMPTIVE POSTIVE SARS-CoV-2 nucleic acids MAY BE PRESENT.   A presumptive positive result was obtained on the submitted specimen  and confirmed on repeat testing.  While 2019 novel coronavirus  (SARS-CoV-2)  nucleic acids may be present in the submitted sample  additional confirmatory testing may be necessary for epidemiological  and / or clinical management purposes  to differentiate between  SARS-CoV-2 and other Sarbecovirus currently known to infect humans.  If clinically indicated additional testing with an alternate test  methodology [phone]) is advised. The SARS-CoV-2 RNA is generally  detectable in upper and lower respiratory sp ecimens during the acute  phase of infection. The expected result is Negative. Fact Sheet for Patients:  StrictlyIdeas.no Fact Sheet for Healthcare Providers: BankingDealers.co.za This test is not yet approved or cleared by the [place] FDA and has been authorized for detection and/or diagnosis of SARS-CoV-2 by FDA under an Emergency Use Authorization (EUA).  This EUA will remain in effect (meaning this test can be used) for the duration of the COVID-19 declaration under Section 564(b)(1) of the Act, 21 U.S.C. section 360bbb-3(b)(1), unless the authorization is terminated or revoked sooner. Performed at [facility], [place] [address] [place], [place] [address]   Blood culture (routine x 2)     Status: Abnormal (Preliminary result)   Collection Time: 01/24/19  4:12 AM   Specimen: BLOOD  Result Value Ref Range Status   Specimen Description   Final    BLOOD LEFT ANTECUBITAL Performed at [place] [address] [place], [place] [address]    Special Requests   Final    BOTTLES DRAWN AEROBIC AND ANAEROBIC Blood Culture adequate volume Performed at [place] [address] [place], [place] [address]    Culture  Setup Time   Final    GRAM POSITIVE COCCI IN BOTH AEROBIC AND ANAEROBIC BOTTLES CRITICAL VALUE NOTED.  VALUE IS CONSISTENT WITH PREVIOUSLY REPORTED AND CALLED VALUE.    Culture (A)  Final    STAPHYLOCOCCUS EPIDERMIDIS  SUSCEPTIBILITIES TO FOLLOW Performed at [facility], [place] [address] [place], [place] [address]    Report Status PENDING  Incomplete  Blood culture (routine x 2)     Status: Abnormal (Preliminary result)   Collection Time: 01/24/19  4:15 AM   Specimen: BLOOD  Result Value Ref Range Status   Specimen Description   Final    BLOOD BLOOD RIGHT FOREARM Performed at [facility] [address], [place], [place] [address]    Special Requests   Final    BOTTLES DRAWN AEROBIC AND ANAEROBIC Blood Culture adequate volume Performed at [place] [address], [place], [place] [address]    Culture  Setup Time   Final    GRAM POSITIVE COCCI IN BOTH AEROBIC AND  ANAEROBIC BOTTLES CRITICAL RESULT CALLED TO, READ BACK BY AND VERIFIED WITH: PHRMD L GREEN @ 9323 01/25/19 BY S GEZAHEGN    Culture (A)  Final    STAPHYLOCOCCUS EPIDERMIDIS SUSCEPTIBILITIES TO FOLLOW Performed at [facility], [place] [address] [place], [place] [address]    Report Status PENDING  Incomplete  Blood Culture ID Panel (Reflexed)     Status: Abnormal   Collection Time: 01/24/19  4:15 AM  Result Value Ref Range Status   Enterococcus species NOT DETECTED NOT DETECTED Final   Listeria monocytogenes NOT DETECTED NOT DETECTED Final   Staphylococcus species DETECTED (A) NOT DETECTED Final    Comment: Methicillin (oxacillin) resistant coagulase negative staphylococcus. Possible blood culture contaminant (unless isolated from more than one blood culture draw or clinical case suggests pathogenicity). No antibiotic treatment is indicated for blood  culture contaminants. CRITICAL RESULT CALLED TO, READ BACK BY AND VERIFIED WITH: PHRMD L GREEN @ 2025 01/25/19 BY S GEZAHEGN    Staphylococcus aureus (BCID) NOT DETECTED NOT DETECTED Final   Methicillin resistance DETECTED (A) NOT DETECTED Final    Comment: CRITICAL RESULT CALLED TO, READ BACK BY AND VERIFIED WITH: PHRMD L GREEN @  4270 01/25/19 BY S GEZAHEGN    Streptococcus species NOT DETECTED NOT DETECTED Final   Streptococcus agalactiae NOT DETECTED NOT DETECTED Final   Streptococcus pneumoniae NOT DETECTED NOT DETECTED Final   Streptococcus pyogenes NOT DETECTED NOT DETECTED Final   Acinetobacter baumannii NOT DETECTED NOT DETECTED Final   Enterobacteriaceae species NOT DETECTED NOT DETECTED Final   Enterobacter cloacae complex NOT DETECTED NOT DETECTED Final   Escherichia coli NOT DETECTED NOT DETECTED Final   Klebsiella oxytoca NOT DETECTED NOT DETECTED Final   Klebsiella pneumoniae NOT DETECTED NOT DETECTED Final   Proteus species NOT DETECTED NOT DETECTED Final   Serratia marcescens NOT DETECTED NOT DETECTED Final   Haemophilus influenzae NOT DETECTED NOT DETECTED Final   Neisseria meningitidis NOT DETECTED NOT DETECTED Final   Pseudomonas aeruginosa NOT DETECTED NOT DETECTED Final   Candida albicans NOT DETECTED NOT DETECTED Final   Candida glabrata NOT DETECTED NOT DETECTED Final   Candida krusei NOT DETECTED NOT DETECTED Final   Candida parapsilosis NOT DETECTED NOT DETECTED Final   Candida tropicalis NOT DETECTED NOT DETECTED Final    Comment: Performed at [facility], [place]. [address] [place], [place] [address]  Culture, blood (routine x 2)     Status: None (Preliminary result)   Collection Time: 01/25/19  8:37 AM   Specimen: BLOOD  Result Value Ref Range Status   Specimen Description   Final    BLOOD RIGHT ARM Performed at [facility] [address], [place], [place] [address]    Special Requests   Final    BOTTLES DRAWN AEROBIC AND ANAEROBIC Blood Culture adequate volume Performed at [facility] [address], [place], [place] [address]    Culture   Final    NO GROWTH < 24 HOURS Performed at [place] [address], [place], [place] [address]    Report Status PENDING  Incomplete  Culture, blood (routine x 2)     Status: None  (Preliminary result)   Collection Time: 01/25/19  8:45 AM   Specimen: BLOOD RIGHT HAND  Result Value Ref Range Status   Specimen Description   Final    BLOOD RIGHT HAND Performed at [facility] [address], [place], [place] [address]  Special Requests   Final    BOTTLES DRAWN AEROBIC AND ANAEROBIC Blood Culture adequate volume Performed at [facility] [address], [place], [place] [address]    Culture   Final    NO GROWTH < 24 HOURS Performed at [facility] [address] [place], [place] [address]    Report Status PENDING  Incomplete  MRSA PCR Screening     Status: Abnormal   Collection Time: 01/25/19  6:29 PM   Specimen: Urine, Random; Nasopharyngeal  Result Value Ref Range Status   MRSA by PCR POSITIVE (A) NEGATIVE Final    Comment:        The GeneXpert MRSA Assay (FDA approved for NASAL specimens only), is one component of a comprehensive MRSA colonization surveillance program. It is not intended to diagnose MRSA infection nor to guide or monitor treatment for MRSA infections. RESULT CALLED TO, READ BACK BY AND VERIFIED WITH: J.JALLOH AT 2242 ON 01/25/19 BY N.THOMPSON Performed at [facility], [place] [address], [place], [place] [address]          Radiology Studies: No results found.      Scheduled Meds: . buPROPion  300 mg Oral Daily  . enoxaparin (LOVENOX) injection  30 mg Subcutaneous Q24H  . escitalopram  5 mg Oral Daily  . feeding supplement  1 Container Oral TID BM  . feeding supplement (PRO-STAT SUGAR FREE 64)  30 mL Oral TID  . lidocaine  1 patch Transdermal Q24H  . Melatonin  3 mg Oral QHS  . multivitamin with minerals  1 tablet Oral Daily  . pantoprazole  40 mg Oral Daily  . pravastatin  20 mg Oral Daily  . saccharomyces boulardii  250 mg Oral BID  . vancomycin  125 mg Oral QID   Continuous Infusions: . sodium chloride 100 mL/hr at 01/25/19 1705  . [START ON  01/27/2019] vancomycin       LOS: 1 day    Time spent: 25 minutes    Barb Merino, MD [facility]

## 2019-01-26 NOTE — Consult Note (Addendum)
[facility] Infectious Disease    Date of Admission:  01/24/2019   Total days of antibiotics: 2 vanco               Reason for Consult: Staph bacteremia    Referring Provider: Ghimire   Assessment: MRSE bacteremia Recurrent C diff Crohn's Disease with fistulas Protein Calorie malnutrition (Alb 2.0) Anemia (chronic)  Plan: 1. She does not have MRSA bacteremia 2. Continue vancomycin 3. PIC has been removed 4. Repeat BCx 10-14 pending 5. She needs TTE (done 10-16) at least. Hold on TEE 6. Continue vancomycin po.  7. Please do NOT repeat her C diff 8. She does not want to go back to [facility] 9. Repeat PIC when repeat BCx clear  Thank you so much for this interesting consult,  Principal Problem:   Bacteremia Active Problems:   Crohn's disease ([place])   MRSA bacteremia   . buPROPion  300 mg Oral Daily  . enoxaparin (LOVENOX) injection  30 mg Subcutaneous Q24H  . escitalopram  5 mg Oral Daily  . feeding supplement  1 Container Oral TID BM  . feeding supplement (PRO-STAT SUGAR FREE 64)  30 mL Oral TID  . lidocaine  1 patch Transdermal Q24H  . Melatonin  3 mg Oral QHS  . multivitamin with minerals  1 tablet Oral Daily  . pantoprazole  40 mg Oral Daily  . pravastatin  20 mg Oral Daily  . saccharomyces boulardii  250 mg Oral BID  . vancomycin  125 mg Oral QID    HPI: Katrina Boyer is a 68 y.o. female with severe chron's disease, fistulas, on home TPN. She has previous had pseudomonas bactermia and MRSA pneumonia (May per pt, in [place]).  She was brought to ED On 10-14 with BCx+ from outpt 10-13. She had chills at that time. These have reportedly positive for MRSA. [place] [facility] Here she is 4/4 MRSE  She denied any issues with her PIC at that time- redness, tenderness, d/c.  States her fistula are internal (no perianal fistula) She also has a hx of diarrhea and C diff (currently on vanco taper).    Review of Systems: Review of  Systems  Constitutional: Positive for chills. Negative for fever.  Respiratory: Negative for cough.   Gastrointestinal: Positive for diarrhea. Negative for constipation.  Genitourinary: Negative for dysuria and urgency.  Please see HPI. All other systems reviewed and negative.   History reviewed. No pertinent past medical history.  Social History   Tobacco Use  . Smoking status: Former Research scientist (life sciences)  . Smokeless tobacco: Never Used  Substance Use Topics  . Alcohol use: Yes    Comment: Social  . Drug use: Never    History reviewed. No pertinent family history.   Medications:  Scheduled: . buPROPion  300 mg Oral Daily  . enoxaparin (LOVENOX) injection  30 mg Subcutaneous Q24H  . escitalopram  5 mg Oral Daily  . feeding supplement  1 Container Oral TID BM  . feeding supplement (PRO-STAT SUGAR FREE 64)  30 mL Oral TID  . lidocaine  1 patch Transdermal Q24H  . Melatonin  3 mg Oral QHS  . multivitamin with minerals  1 tablet Oral Daily  . pantoprazole  40 mg Oral Daily  . pravastatin  20 mg Oral Daily  . saccharomyces boulardii  250 mg Oral BID  . vancomycin  125 mg Oral QID    Abtx:  Anti-infectives (From admission,  onward)   Start     Dose/Rate Route Frequency Ordered Stop   01/25/19 0600  vancomycin (VANCOCIN) 500 mg in sodium chloride 0.9 % 100 mL IVPB     500 mg 100 mL/hr over 60 Minutes Intravenous Every 24 hours 01/24/19 0831     01/24/19 1700  ceFEPIme (MAXIPIME) 2 g in sodium chloride 0.9 % 100 mL IVPB  Status:  Discontinued     2 g 200 mL/hr over 30 Minutes Intravenous Every 12 hours 01/24/19 0831 01/25/19 0729   01/24/19 1000  vancomycin (VANCOCIN) 50 mg/mL oral solution 125 mg     125 mg Oral 4 times daily 01/24/19 0755     01/24/19 0445  ceFEPIme (MAXIPIME) 2 g in sodium chloride 0.9 % 100 mL IVPB     2 g 200 mL/hr over 30 Minutes Intravenous  Once 01/24/19 0434 01/24/19 0537   01/24/19 0300  vancomycin (VANCOCIN) IVPB 1000 mg/200 mL premix     1,000 mg 200  mL/hr over 60 Minutes Intravenous  Once 01/24/19 0256 01/24/19 0708        OBJECTIVE: Blood pressure (!) 144/68, pulse 81, temperature 98.3 F (36.8 C), temperature source Oral, resp. rate 18, height 5' 4"  (1.626 m), weight 45.8 kg, SpO2 100 %.  Physical Exam Constitutional:      Appearance: Normal appearance.  HENT:     Mouth/Throat:     Mouth: Mucous membranes are moist.     Pharynx: No oropharyngeal exudate.  Eyes:     Extraocular Movements: Extraocular movements intact.     Pupils: Pupils are equal, round, and reactive to light.  Neck:     Musculoskeletal: Normal range of motion and neck supple.  Cardiovascular:     Rate and Rhythm: Normal rate and regular rhythm.  Pulmonary:     Effort: Pulmonary effort is normal.     Breath sounds: Normal breath sounds.  Abdominal:     General: Bowel sounds are normal. There is no distension.     Palpations: Abdomen is soft.     Tenderness: There is no abdominal tenderness.  Musculoskeletal:       Arms:     Right lower leg: No edema.     Left lower leg: No edema.  Neurological:     General: No focal deficit present.     Mental Status: She is alert.     Lab Results Results for orders placed or performed during the hospital encounter of 01/24/19 (from the past 48 hour(s))  HIV Antibody (routine testing w rflx)     Status: None   Collection Time: 01/24/19  4:09 PM  Result Value Ref Range   HIV Screen 4th Generation wRfx NON REACTIVE NON REACTIVE    Comment: Performed at [facility], [address], [place], [place] [address]  Glucose, capillary     Status: None   Collection Time: 01/25/19  7:40 AM  Result Value Ref Range   Glucose-Capillary 85 70 - 99 mg/dL   Comment 1 Notify RN    Comment 2 Document in Chart   Comprehensive metabolic panel     Status: Abnormal   Collection Time: 01/25/19  8:36 AM  Result Value Ref Range   Sodium 130 (L) 135 - 145 mmol/L   Potassium 4.5 3.5 - 5.1 mmol/L   Chloride 107 98 - 111  mmol/L   CO2 17 (L) 22 - 32 mmol/L   Glucose, Bld 94 70 - 99 mg/dL   BUN 41 (H)  8 - 23 mg/dL   Creatinine, Ser 0.97 0.44 - 1.00 mg/dL   Calcium 9.4 8.9 - 10.3 mg/dL   Total Protein 6.8 6.5 - 8.1 g/dL   Albumin 2.1 (L) 3.5 - 5.0 g/dL   AST 35 15 - 41 U/L   ALT 47 (H) 0 - 44 U/L   Alkaline Phosphatase 139 (H) 38 - 126 U/L   Total Bilirubin 0.9 0.3 - 1.2 mg/dL   GFR calc non Af Amer >60 >60 mL/min   GFR calc Af Amer >60 >60 mL/min   Anion gap 6 5 - 15    Comment: Performed at [facility], [place] [address] [place], [place] [address]  CBC     Status: Abnormal   Collection Time: 01/25/19  8:36 AM  Result Value Ref Range   WBC 4.3 4.0 - 10.5 K/uL   RBC 2.55 (L) 3.87 - 5.11 MIL/uL   Hemoglobin 7.0 (L) 12.0 - 15.0 g/dL   HCT 23.7 (L) 36.0 - 46.0 %   MCV 92.9 80.0 - 100.0 fL   MCH 27.5 26.0 - 34.0 pg   MCHC 29.5 (L) 30.0 - 36.0 g/dL   RDW 16.9 (H) 11.5 - 15.5 %   Platelets 224 150 - 400 K/uL   nRBC 0.0 0.0 - 0.2 %    Comment: Performed at [facility], [place] [address] [place], [place] [address]  Culture, blood (routine x 2)     Status: None (Preliminary result)   Collection Time: 01/25/19  8:37 AM   Specimen: BLOOD  Result Value Ref Range   Specimen Description      BLOOD RIGHT ARM Performed at [facility] [address] [place], [place] [address]    Special Requests      BOTTLES DRAWN AEROBIC AND ANAEROBIC Blood Culture adequate volume Performed at [facility] [address] [place], [place] [address]    Culture      NO GROWTH < 24 HOURS Performed at [facility] [address], [place], [place] [address]    Report Status PENDING   Culture, blood (routine x 2)     Status: None (Preliminary result)   Collection Time: 01/25/19  8:45 AM   Specimen: BLOOD RIGHT HAND  Result Value Ref Range   Specimen Description      BLOOD RIGHT HAND Performed at [facility]  [address], [place], [place] [address]    Special Requests      BOTTLES DRAWN AEROBIC AND ANAEROBIC Blood Culture adequate volume Performed at [facility] [address], [place], [place] [address]    Culture      NO GROWTH < 24 HOURS Performed at [facility] [address], [place], [place] [address]    Report Status PENDING   Iron and TIBC     Status: Abnormal   Collection Time: 01/25/19  1:49 PM  Result Value Ref Range   Iron 30 28 - 170 ug/dL   TIBC 204 (L) 250 - 450 ug/dL   Saturation Ratios 15 10.4 - 31.8 %   UIBC 174 ug/dL    Comment: Performed at [facility], [place] [address] [place], [place] [address]  Ferritin     Status: Abnormal   Collection Time: 01/25/19  1:49 PM  Result Value Ref Range   Ferritin 891 (H) 11 - 307 ng/mL    Comment: Performed at [facility],  [place] [address], [place], [place] [address]  Vitamin B12     Status: None   Collection Time: 01/25/19  1:49 PM  Result Value Ref Range   Vitamin B-12 743 180 - 914 pg/mL    Comment: (NOTE) This assay is not validated for testing neonatal or myeloproliferative syndrome specimens for Vitamin B12 levels. Performed at [facility], [place] [address] [place], [place] [address]   MRSA PCR Screening     Status: Abnormal   Collection Time: 01/25/19  6:29 PM   Specimen: Urine, Random; Nasopharyngeal  Result Value Ref Range   MRSA by PCR POSITIVE (A) NEGATIVE    Comment:        The GeneXpert MRSA Assay (FDA approved for NASAL specimens only), is one component of a comprehensive MRSA colonization surveillance program. It is not intended to diagnose MRSA infection nor to guide or monitor treatment for MRSA infections. RESULT CALLED TO, READ BACK BY AND VERIFIED WITH: J.JALLOH AT 2242 ON 01/25/19 BY N.THOMPSON Performed at [facility], [place] [address], [place], [place] [address]   Urinalysis, Routine w reflex  microscopic     Status: Abnormal   Collection Time: 01/25/19  6:30 PM  Result Value Ref Range   Color, Urine YELLOW YELLOW   APPearance CLOUDY (A) CLEAR   Specific Gravity, Urine 1.004 (L) 1.005 - 1.030   pH 6.0 5.0 - 8.0   Glucose, UA NEGATIVE NEGATIVE mg/dL   Hgb urine dipstick LARGE (A) NEGATIVE   Bilirubin Urine NEGATIVE NEGATIVE   Ketones, ur NEGATIVE NEGATIVE mg/dL   Protein, ur NEGATIVE NEGATIVE mg/dL   Nitrite NEGATIVE NEGATIVE   Leukocytes,Ua LARGE (A) NEGATIVE   RBC / HPF 21-50 0 - 5 RBC/hpf   WBC, UA >50 (H) 0 - 5 WBC/hpf   Bacteria, UA RARE (A) NONE SEEN   Squamous Epithelial / LPF 0-5 0 - 5   Mucus PRESENT     Comment: Performed at [facility], [place] [address] [place], [place] [address]  Comprehensive metabolic panel     Status: Abnormal   Collection Time: 01/26/19  5:31 AM  Result Value Ref Range   Sodium 133 (L) 135 - 145 mmol/L   Potassium 3.9 3.5 - 5.1 mmol/L   Chloride 111 98 - 111 mmol/L   CO2 15 (L) 22 - 32 mmol/L   Glucose, Bld 94 70 - 99 mg/dL   BUN 27 (H) 8 - 23 mg/dL   Creatinine, Ser 0.78 0.44 - 1.00 mg/dL   Calcium 9.4 8.9 - 10.3 mg/dL   Total Protein 6.4 (L) 6.5 - 8.1 g/dL   Albumin 2.0 (L) 3.5 - 5.0 g/dL   AST 30 15 - 41 U/L   ALT 38 0 - 44 U/L   Alkaline Phosphatase 127 (H) 38 - 126 U/L   Total Bilirubin 0.3 0.3 - 1.2 mg/dL   GFR calc non Af Amer >60 >60 mL/min   GFR calc Af Amer >60 >60 mL/min   Anion gap 7 5 - 15    Comment: Performed at [facility], [place] [address] [place],  [address]  CBC with Differential/Platelet     Status: Abnormal   Collection Time: 01/26/19  5:31 AM  Result Value Ref Range   WBC 3.4 (L) 4.0 - 10.5 K/uL   RBC 2.44 (L) 3.87 - 5.11 MIL/uL   Hemoglobin 6.9 (LL) 12.0 - 15.0 g/dL    Comment: This critical result has verified and been  called to [place] by POTEAT,SHANNON on 10 16 2020 at 0546, and has been read back. CRITICAL RESULT VERIFIED   HCT 22.6 (L) 36.0 - 46.0 %    MCV 92.6 80.0 - 100.0 fL   MCH 28.3 26.0 - 34.0 pg   MCHC 30.5 30.0 - 36.0 g/dL   RDW 16.6 (H) 11.5 - 15.5 %   Platelets 199 150 - 400 K/uL   nRBC 0.0 0.0 - 0.2 %   Neutrophils Relative % 58 %   Neutro Abs 2.0 1.7 - 7.7 K/uL   Lymphocytes Relative 33 %   Lymphs Abs 1.1 0.7 - 4.0 K/uL   Monocytes Relative 8 %   Monocytes Absolute 0.3 0.1 - 1.0 K/uL   Eosinophils Relative 0 %   Eosinophils Absolute 0.0 0.0 - 0.5 K/uL   Basophils Relative 0 %   Basophils Absolute 0.0 0.0 - 0.1 K/uL   Immature Granulocytes 1 %   Abs Immature Granulocytes 0.02 0.00 - 0.07 K/uL   Reactive, Benign Lymphocytes PRESENT     Comment: Performed at [facility], [place] [address] [place], [place] [address]  Magnesium     Status: None   Collection Time: 01/26/19  5:31 AM  Result Value Ref Range   Magnesium 1.8 1.7 - 2.4 mg/dL    Comment: Performed at [facility], [place] [address], [place], [place] [address]  Phosphorus     Status: None   Collection Time: 01/26/19  5:31 AM  Result Value Ref Range   Phosphorus 3.9 2.5 - 4.6 mg/dL    Comment: Performed at [facility], [place] [address], [place], [place] [address]  Prepare RBC     Status: None   Collection Time: 01/26/19  6:20 AM  Result Value Ref Range   Order Confirmation      ORDER PROCESSED BY BLOOD BANK Performed at [facility] [address], [place], [place] [address]   Glucose, capillary     Status: None   Collection Time: 01/26/19  9:09 AM  Result Value Ref Range   Glucose-Capillary 89 70 - 99 mg/dL   Comment 1 Notify RN    Comment 2 Document in Chart       Component Value Date/Time   SDES  01/25/2019 0845    BLOOD RIGHT HAND Performed at [facility], [place] [address] [place], [place] [address]    SPECREQUEST  01/25/2019 0845    BOTTLES DRAWN AEROBIC AND ANAEROBIC Blood Culture adequate volume Performed at [facility]  [address] [place], [place] [address]    CULT  01/25/2019 0845    NO GROWTH < 24 HOURS Performed at [place] [address], [place], [place] [address]    REPTSTATUS PENDING 01/25/2019 0845   No results found. Recent Results (from the past 240 hour(s))  SARS Coronavirus 2 by RT PCR (hospital order, performed in [facility] hospital lab) Nasopharyngeal Nasopharyngeal Swab     Status: None   Collection Time: 01/24/19  4:11 AM   Specimen: Nasopharyngeal Swab  Result Value Ref Range Status   SARS Coronavirus 2 NEGATIVE NEGATIVE Final    Comment: (NOTE) If result is NEGATIVE SARS-CoV-2 target nucleic acids are NOT DETECTED. The SARS-CoV-2 RNA is generally detectable in upper and lower  respiratory specimens during the acute phase of infection. The lowest  concentration of SARS-CoV-2 viral copies this assay can detect is 250  copies / mL.  A negative result does not preclude SARS-CoV-2 infection  and should not be used as the sole basis for treatment or other  patient management decisions.  A negative result may occur with  improper specimen collection / handling, submission of specimen other  than nasopharyngeal swab, presence of viral mutation(s) within the  areas targeted by this assay, and inadequate number of viral copies  (<250 copies / mL). A negative result must be combined with clinical  observations, patient history, and epidemiological information. If result is POSITIVE SARS-CoV-2 target nucleic acids are DETECTED. The SARS-CoV-2 RNA is generally detectable in upper and lower  respiratory specimens dur ing the acute phase of infection.  Positive  results are indicative of active infection with SARS-CoV-2.  Clinical  correlation with patient history and other diagnostic information is  necessary to determine patient infection status.  Positive results do  not rule out bacterial infection or co-infection with other viruses. If result is PRESUMPTIVE POSTIVE SARS-CoV-2  nucleic acids MAY BE PRESENT.   A presumptive positive result was obtained on the submitted specimen  and confirmed on repeat testing.  While 2019 novel coronavirus  (SARS-CoV-2) nucleic acids may be present in the submitted sample  additional confirmatory testing may be necessary for epidemiological  and / or clinical management purposes  to differentiate between  SARS-CoV-2 and other Sarbecovirus currently known to infect humans.  If clinically indicated additional testing with an alternate test  methodology [phone]) is advised. The SARS-CoV-2 RNA is generally  detectable in upper and lower respiratory sp ecimens during the acute  phase of infection. The expected result is Negative. Fact Sheet for Patients:  StrictlyIdeas.no Fact Sheet for Healthcare Providers: BankingDealers.co.za This test is not yet approved or cleared by the [place] FDA and has been authorized for detection and/or diagnosis of SARS-CoV-2 by FDA under an Emergency Use Authorization (EUA).  This EUA will remain in effect (meaning this test can be used) for the duration of the COVID-19 declaration under Section 564(b)(1) of the Act, 21 U.S.C. section 360bbb-3(b)(1), unless the authorization is terminated or revoked sooner. Performed at [facility], [place] [address], [place], [place] [address]   Blood culture (routine x 2)     Status: Abnormal (Preliminary result)   Collection Time: 01/24/19  4:12 AM   Specimen: BLOOD  Result Value Ref Range Status   Specimen Description   Final    BLOOD LEFT ANTECUBITAL Performed at [place] [address], [place], [place] [address]    Special Requests   Final    BOTTLES DRAWN AEROBIC AND ANAEROBIC Blood Culture adequate volume Performed at [place] [address], [place], [place] [address]    Culture  Setup Time   Final    GRAM POSITIVE COCCI IN BOTH  AEROBIC AND ANAEROBIC BOTTLES CRITICAL VALUE NOTED.  VALUE IS CONSISTENT WITH PREVIOUSLY REPORTED AND CALLED VALUE.    Culture (A)  Final    STAPHYLOCOCCUS EPIDERMIDIS SUSCEPTIBILITIES TO FOLLOW Performed at [facility], [place] [address] [place], [place] [address]    Report Status PENDING  Incomplete  Blood culture (routine x 2)     Status: Abnormal (Preliminary result)   Collection Time: 01/24/19  4:15 AM   Specimen: BLOOD  Result Value Ref Range Status   Specimen Description   Final    BLOOD BLOOD RIGHT FOREARM Performed at [place] [address], [place], [place] [address]    Special Requests  Final    BOTTLES DRAWN AEROBIC AND ANAEROBIC Blood Culture adequate volume Performed at [facility] [address], [place], [place] [address]    Culture  Setup Time   Final    GRAM POSITIVE COCCI IN BOTH AEROBIC AND ANAEROBIC BOTTLES CRITICAL RESULT CALLED TO, READ BACK BY AND VERIFIED WITH: PHRMD L GREEN @ 7253 01/25/19 BY S GEZAHEGN    Culture (A)  Final    STAPHYLOCOCCUS EPIDERMIDIS SUSCEPTIBILITIES TO FOLLOW Performed at [facility], [place] [address] [place], [place] [address]    Report Status PENDING  Incomplete  Blood Culture ID Panel (Reflexed)     Status: Abnormal   Collection Time: 01/24/19  4:15 AM  Result Value Ref Range Status   Enterococcus species NOT DETECTED NOT DETECTED Final   Listeria monocytogenes NOT DETECTED NOT DETECTED Final   Staphylococcus species DETECTED (A) NOT DETECTED Final    Comment: Methicillin (oxacillin) resistant coagulase negative staphylococcus. Possible blood culture contaminant (unless isolated from more than one blood culture draw or clinical case suggests pathogenicity). No antibiotic treatment is indicated for blood  culture contaminants. CRITICAL RESULT CALLED TO, READ BACK BY AND VERIFIED WITH: PHRMD L GREEN @ 3474 01/25/19 BY S GEZAHEGN    Staphylococcus aureus (BCID) NOT  DETECTED NOT DETECTED Final   Methicillin resistance DETECTED (A) NOT DETECTED Final    Comment: CRITICAL RESULT CALLED TO, READ BACK BY AND VERIFIED WITH: PHRMD L GREEN @ 2595 01/25/19 BY S GEZAHEGN    Streptococcus species NOT DETECTED NOT DETECTED Final   Streptococcus agalactiae NOT DETECTED NOT DETECTED Final   Streptococcus pneumoniae NOT DETECTED NOT DETECTED Final   Streptococcus pyogenes NOT DETECTED NOT DETECTED Final   Acinetobacter baumannii NOT DETECTED NOT DETECTED Final   Enterobacteriaceae species NOT DETECTED NOT DETECTED Final   Enterobacter cloacae complex NOT DETECTED NOT DETECTED Final   Escherichia coli NOT DETECTED NOT DETECTED Final   Klebsiella oxytoca NOT DETECTED NOT DETECTED Final   Klebsiella pneumoniae NOT DETECTED NOT DETECTED Final   Proteus species NOT DETECTED NOT DETECTED Final   Serratia marcescens NOT DETECTED NOT DETECTED Final   Haemophilus influenzae NOT DETECTED NOT DETECTED Final   Neisseria meningitidis NOT DETECTED NOT DETECTED Final   Pseudomonas aeruginosa NOT DETECTED NOT DETECTED Final   Candida albicans NOT DETECTED NOT DETECTED Final   Candida glabrata NOT DETECTED NOT DETECTED Final   Candida krusei NOT DETECTED NOT DETECTED Final   Candida parapsilosis NOT DETECTED NOT DETECTED Final   Candida tropicalis NOT DETECTED NOT DETECTED Final    Comment: Performed at [facility], [place]. [address], [place], [place] [address]  Culture, blood (routine x 2)     Status: None (Preliminary result)   Collection Time: 01/25/19  8:37 AM   Specimen: BLOOD  Result Value Ref Range Status   Specimen Description   Final    BLOOD RIGHT ARM Performed at [place] [address], [place], [place] [address]    Special Requests   Final    BOTTLES DRAWN AEROBIC AND ANAEROBIC Blood Culture adequate volume Performed at [place] [address] [place], [place] [address]    Culture   Final    NO GROWTH <  24 HOURS Performed at [place] [address] [place], [place] [address]    Report Status PENDING  Incomplete  Culture, blood (routine x 2)     Status: None (Preliminary result)  Collection Time: 01/25/19  8:45 AM   Specimen: BLOOD RIGHT HAND  Result Value Ref Range Status   Specimen Description   Final    BLOOD RIGHT HAND Performed at [place] [address] [place], [place] [address]    Special Requests   Final    BOTTLES DRAWN AEROBIC AND ANAEROBIC Blood Culture adequate volume Performed at [place] [address] [place], [place] [address]    Culture   Final    NO GROWTH < 24 HOURS Performed at [place] [address] [place], [place] [address]    Report Status PENDING  Incomplete  MRSA PCR Screening     Status: Abnormal   Collection Time: 01/25/19  6:29 PM   Specimen: Urine, Random; Nasopharyngeal  Result Value Ref Range Status   MRSA by PCR POSITIVE (A) NEGATIVE Final    Comment:        The GeneXpert MRSA Assay (FDA approved for NASAL specimens only), is one component of a comprehensive MRSA colonization surveillance program. It is not intended to diagnose MRSA infection nor to guide or monitor treatment for MRSA infections. RESULT CALLED TO, READ BACK BY AND VERIFIED WITH: J.JALLOH AT 2242 ON 01/25/19 BY N.THOMPSON Performed at [facility], [place] [address], [place], [place] [address]     Microbiology: Recent Results (from the past 240 hour(s))  SARS Coronavirus 2 by RT PCR (hospital order, performed in [facility] hospital lab) Nasopharyngeal Nasopharyngeal Swab     Status: None   Collection Time: 01/24/19  4:11 AM   Specimen: Nasopharyngeal Swab  Result Value Ref Range Status   SARS Coronavirus 2 NEGATIVE NEGATIVE Final    Comment: (NOTE) If result is NEGATIVE SARS-CoV-2 target nucleic acids are NOT DETECTED. The SARS-CoV-2 RNA is generally detectable in upper and  lower  respiratory specimens during the acute phase of infection. The lowest  concentration of SARS-CoV-2 viral copies this assay can detect is 250  copies / mL. A negative result does not preclude SARS-CoV-2 infection  and should not be used as the sole basis for treatment or other  patient management decisions.  A negative result may occur with  improper specimen collection / handling, submission of specimen other  than nasopharyngeal swab, presence of viral mutation(s) within the  areas targeted by this assay, and inadequate number of viral copies  (<250 copies / mL). A negative result must be combined with clinical  observations, patient history, and epidemiological information. If result is POSITIVE SARS-CoV-2 target nucleic acids are DETECTED. The SARS-CoV-2 RNA is generally detectable in upper and lower  respiratory specimens dur ing the acute phase of infection.  Positive  results are indicative of active infection with SARS-CoV-2.  Clinical  correlation with patient history and other diagnostic information is  necessary to determine patient infection status.  Positive results do  not rule out bacterial infection or co-infection with other viruses. If result is PRESUMPTIVE POSTIVE SARS-CoV-2 nucleic acids MAY BE PRESENT.   A presumptive positive result was obtained on the submitted specimen  and confirmed on repeat testing.  While 2019 novel coronavirus  (SARS-CoV-2) nucleic acids may be present in the submitted sample  additional confirmatory testing may be necessary for epidemiological  and / or clinical management purposes  to differentiate between  SARS-CoV-2 and other Sarbecovirus currently known to infect humans.  If clinically indicated additional testing with an alternate test  methodology [phone]) is advised. The SARS-CoV-2 RNA is  generally  detectable in upper and lower respiratory sp ecimens during the acute  phase of infection. The expected result is Negative.  Fact Sheet for Patients:  StrictlyIdeas.no Fact Sheet for Healthcare Providers: BankingDealers.co.za This test is not yet approved or cleared by the [place] FDA and has been authorized for detection and/or diagnosis of SARS-CoV-2 by FDA under an Emergency Use Authorization (EUA).  This EUA will remain in effect (meaning this test can be used) for the duration of the COVID-19 declaration under Section 564(b)(1) of the Act, 21 U.S.C. section 360bbb-3(b)(1), unless the authorization is terminated or revoked sooner. Performed at [facility], [place] [address] [place], [place] [address]   Blood culture (routine x 2)     Status: Abnormal (Preliminary result)   Collection Time: 01/24/19  4:12 AM   Specimen: BLOOD  Result Value Ref Range Status   Specimen Description   Final    BLOOD LEFT ANTECUBITAL Performed at [place] [address], [place], [place] [address]    Special Requests   Final    BOTTLES DRAWN AEROBIC AND ANAEROBIC Blood Culture adequate volume Performed at [facility] [address], [place], [place] [address]    Culture  Setup Time   Final    GRAM POSITIVE COCCI IN BOTH AEROBIC AND ANAEROBIC BOTTLES CRITICAL VALUE NOTED.  VALUE IS CONSISTENT WITH PREVIOUSLY REPORTED AND CALLED VALUE.    Culture (A)  Final    STAPHYLOCOCCUS EPIDERMIDIS SUSCEPTIBILITIES TO FOLLOW Performed at [facility], [place] [address] [place], [place] [address]    Report Status PENDING  Incomplete  Blood culture (routine x 2)     Status: Abnormal (Preliminary result)   Collection Time: 01/24/19  4:15 AM   Specimen: BLOOD  Result Value Ref Range Status   Specimen Description   Final    BLOOD BLOOD RIGHT FOREARM Performed at [place] [address], [place], [place] [address]    Special Requests   Final    BOTTLES DRAWN AEROBIC AND ANAEROBIC Blood  Culture adequate volume Performed at [place] [address] [place], [place] [address]    Culture  Setup Time   Final    GRAM POSITIVE COCCI IN BOTH AEROBIC AND ANAEROBIC BOTTLES CRITICAL RESULT CALLED TO, READ BACK BY AND VERIFIED WITH: PHRMD L GREEN @ 3474 01/25/19 BY S GEZAHEGN    Culture (A)  Final    STAPHYLOCOCCUS EPIDERMIDIS SUSCEPTIBILITIES TO FOLLOW Performed at [facility], [place] [address], [place], [place] [address]    Report Status PENDING  Incomplete  Blood Culture ID Panel (Reflexed)     Status: Abnormal   Collection Time: 01/24/19  4:15 AM  Result Value Ref Range Status   Enterococcus species NOT DETECTED NOT DETECTED Final   Listeria monocytogenes NOT DETECTED NOT DETECTED Final   Staphylococcus species DETECTED (A) NOT DETECTED Final    Comment: Methicillin (oxacillin) resistant coagulase negative staphylococcus. Possible blood culture contaminant (unless isolated from more than one blood culture draw or clinical case suggests pathogenicity). No antibiotic treatment is indicated for blood  culture contaminants. CRITICAL RESULT CALLED TO, READ BACK BY AND VERIFIED WITH: PHRMD L GREEN @ 3875 01/25/19 BY S GEZAHEGN    Staphylococcus aureus (BCID) NOT DETECTED NOT DETECTED Final   Methicillin resistance DETECTED (A) NOT DETECTED Final    Comment: CRITICAL RESULT CALLED TO, READ BACK BY AND VERIFIED WITH: PHRMD L GREEN @ 6433 01/25/19  BY S GEZAHEGN    Streptococcus species NOT DETECTED NOT DETECTED Final   Streptococcus agalactiae NOT DETECTED NOT DETECTED Final   Streptococcus pneumoniae NOT DETECTED NOT DETECTED Final   Streptococcus pyogenes NOT DETECTED NOT DETECTED Final   Acinetobacter baumannii NOT DETECTED NOT DETECTED Final   Enterobacteriaceae species NOT DETECTED NOT DETECTED Final   Enterobacter cloacae complex NOT DETECTED NOT DETECTED Final   Escherichia coli NOT DETECTED NOT DETECTED Final   Klebsiella oxytoca NOT  DETECTED NOT DETECTED Final   Klebsiella pneumoniae NOT DETECTED NOT DETECTED Final   Proteus species NOT DETECTED NOT DETECTED Final   Serratia marcescens NOT DETECTED NOT DETECTED Final   Haemophilus influenzae NOT DETECTED NOT DETECTED Final   Neisseria meningitidis NOT DETECTED NOT DETECTED Final   Pseudomonas aeruginosa NOT DETECTED NOT DETECTED Final   Candida albicans NOT DETECTED NOT DETECTED Final   Candida glabrata NOT DETECTED NOT DETECTED Final   Candida krusei NOT DETECTED NOT DETECTED Final   Candida parapsilosis NOT DETECTED NOT DETECTED Final   Candida tropicalis NOT DETECTED NOT DETECTED Final    Comment: Performed at [facility], [place] [address], [place], [place] [address]  Culture, blood (routine x 2)     Status: None (Preliminary result)   Collection Time: 01/25/19  8:37 AM   Specimen: BLOOD  Result Value Ref Range Status   Specimen Description   Final    BLOOD RIGHT ARM Performed at [facility] [address] [place], [place] [address]    Special Requests   Final    BOTTLES DRAWN AEROBIC AND ANAEROBIC Blood Culture adequate volume Performed at [facility] [address] [place], [place] [address]    Culture   Final    NO GROWTH < 24 HOURS Performed at [place] [address] [place], [place] [address]    Report Status PENDING  Incomplete  Culture, blood (routine x 2)     Status: None (Preliminary result)   Collection Time: 01/25/19  8:45 AM   Specimen: BLOOD RIGHT HAND  Result Value Ref Range Status   Specimen Description   Final    BLOOD RIGHT HAND Performed at [facility] [address], [place], [place] [address]    Special Requests   Final    BOTTLES DRAWN AEROBIC AND ANAEROBIC Blood Culture adequate volume Performed at [place] [address], [place], [place] [address]    Culture   Final    NO GROWTH < 24 HOURS Performed at [facility] [address], [place], [place] [address]    Report Status PENDING  Incomplete  MRSA PCR Screening     Status: Abnormal   Collection Time: 01/25/19  6:29 PM   Specimen: Urine, Random; Nasopharyngeal  Result Value Ref Range Status   MRSA by PCR POSITIVE (A) NEGATIVE Final    Comment:        The GeneXpert MRSA Assay (FDA approved for NASAL specimens only), is one component of a comprehensive MRSA colonization surveillance program. It is not intended to diagnose MRSA infection nor to guide or monitor treatment for MRSA infections. RESULT CALLED TO, READ BACK BY AND VERIFIED WITH: J.JALLOH AT 2242 ON 01/25/19 BY N.THOMPSON Performed at [facility], [place] [address], [place],  [address]     Radiographs and labs were personally reviewed by me.   Bobby Rumpf, MD [facility] [phone] 01/26/2019, 12:11 PM

## 2019-01-27 LAB — GLUCOSE, CAPILLARY
Glucose-Capillary: 104 mg/dL — ABNORMAL HIGH (ref 70–99)
Glucose-Capillary: 139 mg/dL — ABNORMAL HIGH (ref 70–99)
Glucose-Capillary: 163 mg/dL — ABNORMAL HIGH (ref 70–99)

## 2019-01-27 LAB — BPAM RBC
Blood Product Expiration Date: 202011232359
ISSUE DATE / TIME: 202010161016
Unit Type and Rh: 5100

## 2019-01-27 LAB — TYPE AND SCREEN
ABO/RH(D): O POS
Antibody Screen: NEGATIVE
Unit division: 0

## 2019-01-27 LAB — CULTURE, BLOOD (ROUTINE X 2)
Special Requests: ADEQUATE
Special Requests: ADEQUATE

## 2019-01-27 NOTE — Progress Notes (Signed)
PROGRESS NOTE    Katrina Boyer  WJX:[id number] DOB: 1950-04-19 DOA: 01/24/2019 PCP: Seward Carol, MD    Brief Narrative:  68 year old female with severe medically refractory Crohn's disease with fistulas, on TPN and ultimate plan for total colectomy, currently at SNF, recent Pseudomonas bacteremia, MRSA pneumonia who presented from skilled nursing facility to the emergency room on request of her GI physician with positive blood cultures collected on 01/23/2019.  Patient is recovering at a SNF with her previous bacteremia, she had a PICC line, she also has C. difficile and on prolonged vancomycin taper, chronic diarrhea, had routine follow-up due to episode of chills last week, blood cultures drawn that were positive for MRSE and was asked to come to the nearest emergency room.  In the emergency room, patient was hemodynamically stable.  No leukocytosis.  Admitted for management of bacteremia.  Assessment & Plan:   Principal Problem:   Bacteremia Active Problems:   Crohn's disease ([place])   MRSA bacteremia  MRSE bacteremia due to presence of PICC line. indwelling PICC line with TPN: 4 out of 4 bottles positive for MRSE. Hemodynamically stable today. On vancomycin  Repeat blood cultures no growth so far from 01/25/2019. TTE, no vegetation. use peripheral IV line, holding off on PICC line until cultures improve. Appreciate infectious disease input.  Chronic recurrent C. difficile colitis: Patient will stay on vancomycin by mouth, will continue vancomycin by mouth and prolonged taper until she is on antibiotics.  Moderate protein calorie malnutrition: On TPN.  Discontinued due to bacteremia.  Will allow bacteremia to clear before inserting another line.  Will allow regular diet and supplements.  Severe Crohn's disease with fistulas: Follows up at [facility] and surgery.  She is getting prepared for surgery, waiting for nutrition to improve.  She will follow-up once stable.  Anemia of  chronic disease: Hemoglobin was 6.9.  Received 1 unit of PRBC with appropriate clinical response.  We will continue to monitor. Iron levels, ferritin and B12 is normal.   Abnormal urinalysis: Growing more than 50,000 colonies of gram-negative rods.  Probably colonized.  No antibiotics indicated at this time.  DVT prophylaxis: Lovenox subcu Code Status: Full code Family Communication: None, patient stated that she will talk to her son. Disposition Plan: Unknown.  Back to skilled nursing facility.  May need to go back with TPN and IV antibiotics.   Consultants:   Infectious disease  Procedures:   None  Antimicrobials:   Vancomycin, 01/24/2019---  Cefepime, 01/24/2019--- 01/25/2019   Subjective: Seen and examined.  No overnight events.  Afebrile.  Multiple loose watery bowel movements overnight and that is usual for her. Objective: Vitals:   01/26/19 1042 01/26/19 1359 01/26/19 2030 01/27/19 0525  BP: (!) 144/68 (!) 143/70 (!) 159/81 (!) 141/76  Pulse: 81 80 74 81  Resp: 18 20 16 16   Temp: 98.3 F (36.8 C) 98.7 F (37.1 C) 98.8 F (37.1 C) 97.6 F (36.4 C)  TempSrc: Oral Oral Oral Oral  SpO2: 100% 98% 100% 100%  Weight:      Height:        Intake/Output Summary (Last 24 hours) at 01/27/2019 1129 Last data filed at 01/27/2019 0802 Gross per 24 hour  Intake 1424 ml  Output 2 ml  Net 1422 ml   Filed Weights   01/24/19 0239  Weight: 45.8 kg    Examination:  General exam: Appears calm and comfortable, chronically sick looking.  On room air. Respiratory system: Clear to auscultation. Respiratory effort normal.  No added sounds Cardiovascular system: S1 & S2 heard, RRR. No JVD, murmurs, rubs, gallops or clicks. No pedal edema. Gastrointestinal system: Abdomen is nondistended, soft and nontender. No organomegaly or masses felt. Normal bowel sounds heard. Central nervous system: Alert and oriented. No focal neurological deficits. Extremities: Symmetric 5 x 5 power.  Skin: No rashes, lesions or ulcers Psychiatry: Judgement and insight appear normal. Mood & affect appropriate.     Data Reviewed: I have personally reviewed following labs and imaging studies  CBC: Recent Labs  Lab 01/24/19 0253 01/25/19 0836 01/25/19 1349 01/26/19 0531 01/26/19 1538  WBC 6.7 4.3  --  3.4*  --   NEUTROABS 4.9  --   --  2.0  --   HGB 7.6* 7.0*  --  6.9* 9.2*  HCT 25.4* 23.7* 23.2* 22.6* 30.7*  MCV 92.0 92.9  --  92.6  --   PLT 234 224  --  199  --    Basic Metabolic Panel: Recent Labs  Lab 01/24/19 0253 01/25/19 0836 01/26/19 0531  NA 129* 130* 133*  K 4.6 4.5 3.9  CL 101 107 111  CO2 19* 17* 15*  GLUCOSE 102* 94 94  BUN 57* 41* 27*  CREATININE 0.97 0.97 0.78  CALCIUM 9.4 9.4 9.4  MG  --   --  1.8  PHOS  --   --  3.9   GFR: Estimated Creatinine Clearance: 50.7 mL/min (by C-G formula based on SCr of 0.78 mg/dL). Liver Function Tests: Recent Labs  Lab 01/24/19 0253 01/25/19 0836 01/26/19 0531  AST 52* 35 30  ALT 66* 47* 38  ALKPHOS 169* 139* 127*  BILITOT 0.9 0.9 0.3  PROT 7.5 6.8 6.4*  ALBUMIN 2.4* 2.1* 2.0*   No results for input(s): LIPASE, AMYLASE in the last 168 hours. No results for input(s): AMMONIA in the last 168 hours. Coagulation Profile: No results for input(s): INR, PROTIME in the last 168 hours. Cardiac Enzymes: No results for input(s): CKTOTAL, CKMB, CKMBINDEX, TROPONINI in the last 168 hours. BNP (last 3 results) No results for input(s): PROBNP in the last 8760 hours. HbA1C: No results for input(s): HGBA1C in the last 72 hours. CBG: Recent Labs  Lab 01/25/19 0740 01/26/19 0909 01/27/19 0739  GLUCAP 85 89 104*   Lipid Profile: No results for input(s): CHOL, HDL, LDLCALC, TRIG, CHOLHDL, LDLDIRECT in the last 72 hours. Thyroid Function Tests: No results for input(s): TSH, T4TOTAL, FREET4, T3FREE, THYROIDAB in the last 72 hours. Anemia Panel: Recent Labs    01/25/19 1349  VITAMINB12 743  FERRITIN 891*  TIBC  204*  IRON 30   Sepsis Labs: Recent Labs  Lab 01/24/19 0411  LATICACIDVEN 1.0    Recent Results (from the past 240 hour(s))  SARS Coronavirus 2 by RT PCR (hospital order, performed in [facility] hospital lab) Nasopharyngeal Nasopharyngeal Swab     Status: None   Collection Time: 01/24/19  4:11 AM   Specimen: Nasopharyngeal Swab  Result Value Ref Range Status   SARS Coronavirus 2 NEGATIVE NEGATIVE Final    Comment: (NOTE) If result is NEGATIVE SARS-CoV-2 target nucleic acids are NOT DETECTED. The SARS-CoV-2 RNA is generally detectable in upper and lower  respiratory specimens during the acute phase of infection. The lowest  concentration of SARS-CoV-2 viral copies this assay can detect is 250  copies / mL. A negative result does not preclude SARS-CoV-2 infection  and should not be used as the sole basis for treatment or other  patient management decisions.  A negative result may occur with  improper specimen collection / handling, submission of specimen other  than nasopharyngeal swab, presence of viral mutation(s) within the  areas targeted by this assay, and inadequate number of viral copies  (<250 copies / mL). A negative result must be combined with clinical  observations, patient history, and epidemiological information. If result is POSITIVE SARS-CoV-2 target nucleic acids are DETECTED. The SARS-CoV-2 RNA is generally detectable in upper and lower  respiratory specimens dur ing the acute phase of infection.  Positive  results are indicative of active infection with SARS-CoV-2.  Clinical  correlation with patient history and other diagnostic information is  necessary to determine patient infection status.  Positive results do  not rule out bacterial infection or co-infection with other viruses. If result is PRESUMPTIVE POSTIVE SARS-CoV-2 nucleic acids MAY BE PRESENT.   A presumptive positive result was obtained on the submitted specimen  and confirmed on repeat testing.   While 2019 novel coronavirus  (SARS-CoV-2) nucleic acids may be present in the submitted sample  additional confirmatory testing may be necessary for epidemiological  and / or clinical management purposes  to differentiate between  SARS-CoV-2 and other Sarbecovirus currently known to infect humans.  If clinically indicated additional testing with an alternate test  methodology [phone]) is advised. The SARS-CoV-2 RNA is generally  detectable in upper and lower respiratory sp ecimens during the acute  phase of infection. The expected result is Negative. Fact Sheet for Patients:  StrictlyIdeas.no Fact Sheet for Healthcare Providers: BankingDealers.co.za This test is not yet approved or cleared by the [place] FDA and has been authorized for detection and/or diagnosis of SARS-CoV-2 by FDA under an Emergency Use Authorization (EUA).  This EUA will remain in effect (meaning this test can be used) for the duration of the COVID-19 declaration under Section 564(b)(1) of the Act, 21 U.S.C. section 360bbb-3(b)(1), unless the authorization is terminated or revoked sooner. Performed at [facility], [place] [address] [place], [place] [address]   Blood culture (routine x 2)     Status: Abnormal   Collection Time: 01/24/19  4:12 AM   Specimen: BLOOD  Result Value Ref Range Status   Specimen Description   Final    BLOOD LEFT ANTECUBITAL Performed at [facility] [address], [place], [place] [address]    Special Requests   Final    BOTTLES DRAWN AEROBIC AND ANAEROBIC Blood Culture adequate volume Performed at [facility] [address], [place], [place] [address]    Culture  Setup Time   Final    GRAM POSITIVE COCCI IN BOTH AEROBIC AND ANAEROBIC BOTTLES CRITICAL VALUE NOTED.  VALUE IS CONSISTENT WITH PREVIOUSLY REPORTED AND CALLED VALUE.    Culture (A)  Final    STAPHYLOCOCCUS  EPIDERMIDIS SUSCEPTIBILITIES PERFORMED ON PREVIOUS CULTURE WITHIN THE LAST 5 DAYS. Performed at [facility], [place] [address] [place], [place] [address]    Report Status 01/27/2019 FINAL  Final  Blood culture (routine x 2)     Status: Abnormal   Collection Time: 01/24/19  4:15 AM   Specimen: BLOOD  Result Value Ref Range Status   Specimen Description   Final    BLOOD BLOOD RIGHT FOREARM Performed at [facility] [address] [place], [place] [address]    Special Requests   Final    BOTTLES DRAWN AEROBIC AND ANAEROBIC Blood Culture adequate volume Performed at [facility]  [address] [place], [place] [address]    Culture  Setup Time   Final    GRAM POSITIVE COCCI IN BOTH AEROBIC AND ANAEROBIC BOTTLES CRITICAL RESULT CALLED TO, READ BACK BY AND VERIFIED WITH: PHRMD L GREEN @ 2863 01/25/19 BY S GEZAHEGN Performed at [facility], [place] [address], [place], [place] [address]    Culture STAPHYLOCOCCUS EPIDERMIDIS (A)  Final   Report Status 01/27/2019 FINAL  Final   Organism ID, Bacteria STAPHYLOCOCCUS EPIDERMIDIS  Final      Susceptibility   Staphylococcus epidermidis - MIC*    CIPROFLOXACIN <=0.5 SENSITIVE Sensitive     ERYTHROMYCIN >=8 RESISTANT Resistant     GENTAMICIN <=0.5 SENSITIVE Sensitive     OXACILLIN >=4 RESISTANT Resistant     TETRACYCLINE >=16 RESISTANT Resistant     VANCOMYCIN 1 SENSITIVE Sensitive     TRIMETH/SULFA <=10 SENSITIVE Sensitive     CLINDAMYCIN >=8 RESISTANT Resistant     RIFAMPIN <=0.5 SENSITIVE Sensitive     Inducible Clindamycin NEGATIVE Sensitive     * STAPHYLOCOCCUS EPIDERMIDIS  Blood Culture ID Panel (Reflexed)     Status: Abnormal   Collection Time: 01/24/19  4:15 AM  Result Value Ref Range Status   Enterococcus species NOT DETECTED NOT DETECTED Final   Listeria monocytogenes NOT DETECTED NOT DETECTED Final   Staphylococcus species DETECTED (A) NOT DETECTED Final    Comment: Methicillin  (oxacillin) resistant coagulase negative staphylococcus. Possible blood culture contaminant (unless isolated from more than one blood culture draw or clinical case suggests pathogenicity). No antibiotic treatment is indicated for blood  culture contaminants. CRITICAL RESULT CALLED TO, READ BACK BY AND VERIFIED WITH: PHRMD L GREEN @ 1657 01/25/19 BY S GEZAHEGN    Staphylococcus aureus (BCID) NOT DETECTED NOT DETECTED Final   Methicillin resistance DETECTED (A) NOT DETECTED Final    Comment: CRITICAL RESULT CALLED TO, READ BACK BY AND VERIFIED WITH: PHRMD L GREEN @ 9038 01/25/19 BY S GEZAHEGN    Streptococcus species NOT DETECTED NOT DETECTED Final   Streptococcus agalactiae NOT DETECTED NOT DETECTED Final   Streptococcus pneumoniae NOT DETECTED NOT DETECTED Final   Streptococcus pyogenes NOT DETECTED NOT DETECTED Final   Acinetobacter baumannii NOT DETECTED NOT DETECTED Final   Enterobacteriaceae species NOT DETECTED NOT DETECTED Final   Enterobacter cloacae complex NOT DETECTED NOT DETECTED Final   Escherichia coli NOT DETECTED NOT DETECTED Final   Klebsiella oxytoca NOT DETECTED NOT DETECTED Final   Klebsiella pneumoniae NOT DETECTED NOT DETECTED Final   Proteus species NOT DETECTED NOT DETECTED Final   Serratia marcescens NOT DETECTED NOT DETECTED Final   Haemophilus influenzae NOT DETECTED NOT DETECTED Final   Neisseria meningitidis NOT DETECTED NOT DETECTED Final   Pseudomonas aeruginosa NOT DETECTED NOT DETECTED Final   Candida albicans NOT DETECTED NOT DETECTED Final   Candida glabrata NOT DETECTED NOT DETECTED Final   Candida krusei NOT DETECTED NOT DETECTED Final   Candida parapsilosis NOT DETECTED NOT DETECTED Final   Candida tropicalis NOT DETECTED NOT DETECTED Final    Comment: Performed at [facility], [place]. [address] [place], [place] [address]  Culture, blood (routine x 2)     Status: None (Preliminary result)   Collection Time: 01/25/19  8:37 AM   Specimen: BLOOD   Result Value Ref Range Status   Specimen Description   Final    BLOOD RIGHT ARM Performed at [facility] [address], [place], [place] [address]    Special Requests  Final    BOTTLES DRAWN AEROBIC AND ANAEROBIC Blood Culture adequate volume Performed at [facility] [address], [place], [place] [address]    Culture   Final    NO GROWTH 2 DAYS Performed at [facility] [address] [place], [place] [address]    Report Status PENDING  Incomplete  Culture, blood (routine x 2)     Status: None (Preliminary result)   Collection Time: 01/25/19  8:45 AM   Specimen: BLOOD RIGHT HAND  Result Value Ref Range Status   Specimen Description   Final    BLOOD RIGHT HAND Performed at [facility] [address], [place], [place] [address]    Special Requests   Final    BOTTLES DRAWN AEROBIC AND ANAEROBIC Blood Culture adequate volume Performed at [facility] [address], [place], [place] [address]    Culture   Final    NO GROWTH 2 DAYS Performed at [facility] [address], [place], [place] [address]    Report Status PENDING  Incomplete  MRSA PCR Screening     Status: Abnormal   Collection Time: 01/25/19  6:29 PM   Specimen: Urine, Random; Nasopharyngeal  Result Value Ref Range Status   MRSA by PCR POSITIVE (A) NEGATIVE Final    Comment:        The GeneXpert MRSA Assay (FDA approved for NASAL specimens only), is one component of a comprehensive MRSA colonization surveillance program. It is not intended to diagnose MRSA infection nor to guide or monitor treatment for MRSA infections. RESULT CALLED TO, READ BACK BY AND VERIFIED WITH: J.JALLOH AT 2242 ON 01/25/19 BY N.THOMPSON Performed at [facility], [place] [address] [place], [place] [address]   Urine Culture     Status: Abnormal (Preliminary result)   Collection Time: 01/25/19  6:30 PM   Specimen:  Urine, Random  Result Value Ref Range Status   Specimen Description   Final    URINE, RANDOM Performed at [place] [place] [address], [place], [place] [address]    Special Requests   Final    NONE Performed at [facility], [place] [address] [place], [place] [address]    Culture 50,000 COLONIES/mL GRAM NEGATIVE RODS (A)  Final   Report Status PENDING  Incomplete         Radiology Studies: No results found.      Scheduled Meds: . buPROPion  300 mg Oral Daily  . enoxaparin (LOVENOX) injection  30 mg Subcutaneous Q24H  . escitalopram  5 mg Oral Daily  . feeding supplement  1 Container Oral TID BM  . feeding supplement (PRO-STAT SUGAR FREE 64)  30 mL Oral TID  . lidocaine  1 patch Transdermal Q24H  . Melatonin  3 mg Oral QHS  . multivitamin with minerals  1 tablet Oral Daily  . pantoprazole  40 mg Oral Daily  . pravastatin  20 mg Oral Daily  . saccharomyces boulardii  250 mg Oral BID  . vancomycin  125 mg Oral QID   Continuous Infusions: . sodium chloride Stopped (01/27/19 0621)  . vancomycin 750 mg (01/27/19 0621)     LOS: 2 days    Time spent: 25 minutes    Barb Merino, MD [facility]

## 2019-01-28 LAB — URINE CULTURE: Culture: 50000 — AB

## 2019-01-28 LAB — GLUCOSE, CAPILLARY
Glucose-Capillary: 91 mg/dL (ref 70–99)
Glucose-Capillary: 132 mg/dL — ABNORMAL HIGH (ref 70–99)

## 2019-01-28 MED ORDER — DIPHENHYDRAMINE HCL 50 MG/ML IJ SOLN
25.0000 mg | Freq: Once | INTRAMUSCULAR | Status: AC
  Administered 2019-01-28: 20:00:00 25 mg via INTRAVENOUS
  Filled 2019-01-28: qty 1

## 2019-01-28 NOTE — Progress Notes (Signed)
PROGRESS NOTE    Katrina Boyer  NWG:[id number] DOB: Nov 17, 1950 DOA: 01/24/2019 PCP: Seward Carol, MD    Brief Narrative:  68 year old female with severe medically refractory Crohn's disease with fistulas, on TPN and ultimate plan for total colectomy, currently at SNF, recent Pseudomonas bacteremia, MRSA pneumonia who presented from skilled nursing facility to the emergency room on request of her GI physician with positive blood cultures collected on 01/23/2019.  Patient is recovering at a SNF with her previous bacteremia, she had a PICC line, she also has C. difficile and on prolonged vancomycin taper, chronic diarrhea, had routine follow-up due to episode of chills last week, blood cultures drawn that were positive for MRSE and was asked to come to the nearest emergency room.  In the emergency room, patient was hemodynamically stable.  No leukocytosis.  Admitted for management of bacteremia.  Assessment & Plan:   Principal Problem:   Bacteremia Active Problems:   Crohn's disease ([place])   MRSA bacteremia  MRSE bacteremia due to presence of PICC line. indwelling PICC line with TPN: 4 out of 4 bottles positive for MRSE. Hemodynamically stable today. On vancomycin  Repeat blood cultures no growth so far from 01/25/2019. TTE, no vegetation. use peripheral IV line, holding off on PICC line until cultures improve. Appreciate infectious disease input. Waiting for ID plan to finalize antibiotics.  Chronic recurrent C. difficile colitis: Patient will stay on vancomycin by mouth, will continue vancomycin by mouth and prolonged taper until she is on antibiotics.  Moderate protein calorie malnutrition: On TPN.  Discontinued due to bacteremia.  Will allow bacteremia to clear before inserting another line.  Will allow regular diet and supplements.  Severe Crohn's disease with fistulas: Follows up at [facility] and surgery.  She is getting prepared for surgery, waiting for nutrition to improve.  She  will follow-up once stable.  Anemia of chronic disease: Hemoglobin was 6.9.  Received 1 unit of PRBC with appropriate clinical response.  We will continue to monitor. Iron levels, ferritin and B12 is normal.   Abnormal urinalysis: Growing Pseudomonas.  Probably colonized.    DVT prophylaxis: Lovenox subcu Code Status: Full code Family Communication: None. Disposition Plan: Unknown.  Back to skilled nursing facility.  May need to go back with TPN and IV antibiotics.  Pending ID plan.   Consultants:   Infectious disease  Procedures:   None  Antimicrobials:   Vancomycin, 01/24/2019---  Cefepime, 01/24/2019--- 01/25/2019   Subjective: Patient seen and examined.  No overnight events.  Continues to have right hip pain that is making her difficult to even sit upright.  Using some pain medications. Objective: Vitals:   01/26/19 1359 01/26/19 2030 01/27/19 0525 01/27/19 1326  BP: (!) 143/70 (!) 159/81 (!) 141/76 125/67  Pulse: 80 74 81 77  Resp: 20 16 16 18   Temp: 98.7 F (37.1 C) 98.8 F (37.1 C) 97.6 F (36.4 C) 98.8 F (37.1 C)  TempSrc: Oral Oral Oral Oral  SpO2: 98% 100% 100% 99%  Weight:      Height:        Intake/Output Summary (Last 24 hours) at 01/28/2019 1119 Last data filed at 01/28/2019 0900 Gross per 24 hour  Intake 1135.83 ml  Output 6 ml  Net 1129.83 ml   Filed Weights   01/24/19 0239  Weight: 45.8 kg    Examination:  General exam: Appears calm and comfortable, chronically sick looking.  On room air. Respiratory system: Clear to auscultation. Respiratory effort normal.  No added sounds  Cardiovascular system: S1 & S2 heard, RRR. No JVD, murmurs, rubs, gallops or clicks. No pedal edema. Gastrointestinal system: Abdomen is nondistended, soft and nontender. No organomegaly or masses felt. Normal bowel sounds heard. Central nervous system: Alert and oriented. No focal neurological deficits. Extremities: Symmetric 5 x 5 power.  Right hip tenderness  without swelling or effusions or deformity. Skin: No rashes, lesions or ulcers Psychiatry: Judgement and insight appear normal. Mood & affect appropriate.     Data Reviewed: I have personally reviewed following labs and imaging studies  CBC: Recent Labs  Lab 01/24/19 0253 01/25/19 0836 01/25/19 1349 01/26/19 0531 01/26/19 1538  WBC 6.7 4.3  --  3.4*  --   NEUTROABS 4.9  --   --  2.0  --   HGB 7.6* 7.0*  --  6.9* 9.2*  HCT 25.4* 23.7* 23.2* 22.6* 30.7*  MCV 92.0 92.9  --  92.6  --   PLT 234 224  --  199  --    Basic Metabolic Panel: Recent Labs  Lab 01/24/19 0253 01/25/19 0836 01/26/19 0531  NA 129* 130* 133*  K 4.6 4.5 3.9  CL 101 107 111  CO2 19* 17* 15*  GLUCOSE 102* 94 94  BUN 57* 41* 27*  CREATININE 0.97 0.97 0.78  CALCIUM 9.4 9.4 9.4  MG  --   --  1.8  PHOS  --   --  3.9   GFR: Estimated Creatinine Clearance: 50.7 mL/min (by C-G formula based on SCr of 0.78 mg/dL). Liver Function Tests: Recent Labs  Lab 01/24/19 0253 01/25/19 0836 01/26/19 0531  AST 52* 35 30  ALT 66* 47* 38  ALKPHOS 169* 139* 127*  BILITOT 0.9 0.9 0.3  PROT 7.5 6.8 6.4*  ALBUMIN 2.4* 2.1* 2.0*   No results for input(s): LIPASE, AMYLASE in the last 168 hours. No results for input(s): AMMONIA in the last 168 hours. Coagulation Profile: No results for input(s): INR, PROTIME in the last 168 hours. Cardiac Enzymes: No results for input(s): CKTOTAL, CKMB, CKMBINDEX, TROPONINI in the last 168 hours. BNP (last 3 results) No results for input(s): PROBNP in the last 8760 hours. HbA1C: No results for input(s): HGBA1C in the last 72 hours. CBG: Recent Labs  Lab 01/26/19 0909 01/27/19 0739 01/27/19 1604 01/27/19 2254 01/28/19 0730  GLUCAP 89 104* 139* 163* 91   Lipid Profile: No results for input(s): CHOL, HDL, LDLCALC, TRIG, CHOLHDL, LDLDIRECT in the last 72 hours. Thyroid Function Tests: No results for input(s): TSH, T4TOTAL, FREET4, T3FREE, THYROIDAB in the last 72  hours. Anemia Panel: Recent Labs    01/25/19 1349  VITAMINB12 743  FERRITIN 891*  TIBC 204*  IRON 30   Sepsis Labs: Recent Labs  Lab 01/24/19 0411  LATICACIDVEN 1.0    Recent Results (from the past 240 hour(s))  SARS Coronavirus 2 by RT PCR (hospital order, performed in [facility] hospital lab) Nasopharyngeal Nasopharyngeal Swab     Status: None   Collection Time: 01/24/19  4:11 AM   Specimen: Nasopharyngeal Swab  Result Value Ref Range Status   SARS Coronavirus 2 NEGATIVE NEGATIVE Final    Comment: (NOTE) If result is NEGATIVE SARS-CoV-2 target nucleic acids are NOT DETECTED. The SARS-CoV-2 RNA is generally detectable in upper and lower  respiratory specimens during the acute phase of infection. The lowest  concentration of SARS-CoV-2 viral copies this assay can detect is 250  copies / mL. A negative result does not preclude SARS-CoV-2 infection  and should not be used  as the sole basis for treatment or other  patient management decisions.  A negative result may occur with  improper specimen collection / handling, submission of specimen other  than nasopharyngeal swab, presence of viral mutation(s) within the  areas targeted by this assay, and inadequate number of viral copies  (<250 copies / mL). A negative result must be combined with clinical  observations, patient history, and epidemiological information. If result is POSITIVE SARS-CoV-2 target nucleic acids are DETECTED. The SARS-CoV-2 RNA is generally detectable in upper and lower  respiratory specimens dur ing the acute phase of infection.  Positive  results are indicative of active infection with SARS-CoV-2.  Clinical  correlation with patient history and other diagnostic information is  necessary to determine patient infection status.  Positive results do  not rule out bacterial infection or co-infection with other viruses. If result is PRESUMPTIVE POSTIVE SARS-CoV-2 nucleic acids MAY BE PRESENT.   A  presumptive positive result was obtained on the submitted specimen  and confirmed on repeat testing.  While 2019 novel coronavirus  (SARS-CoV-2) nucleic acids may be present in the submitted sample  additional confirmatory testing may be necessary for epidemiological  and / or clinical management purposes  to differentiate between  SARS-CoV-2 and other Sarbecovirus currently known to infect humans.  If clinically indicated additional testing with an alternate test  methodology [phone]) is advised. The SARS-CoV-2 RNA is generally  detectable in upper and lower respiratory sp ecimens during the acute  phase of infection. The expected result is Negative. Fact Sheet for Patients:  StrictlyIdeas.no Fact Sheet for Healthcare Providers: BankingDealers.co.za This test is not yet approved or cleared by the [place] FDA and has been authorized for detection and/or diagnosis of SARS-CoV-2 by FDA under an Emergency Use Authorization (EUA).  This EUA will remain in effect (meaning this test can be used) for the duration of the COVID-19 declaration under Section 564(b)(1) of the Act, 21 U.S.C. section 360bbb-3(b)(1), unless the authorization is terminated or revoked sooner. Performed at [facility], [place] [address], [place], [place] [address]   Blood culture (routine x 2)     Status: Abnormal   Collection Time: 01/24/19  4:12 AM   Specimen: BLOOD  Result Value Ref Range Status   Specimen Description   Final    BLOOD LEFT ANTECUBITAL Performed at [facility] [address], [place], [place] [address]    Special Requests   Final    BOTTLES DRAWN AEROBIC AND ANAEROBIC Blood Culture adequate volume Performed at [facility] [address] [place], [place] [address]    Culture  Setup Time   Final    GRAM POSITIVE COCCI IN BOTH AEROBIC AND ANAEROBIC BOTTLES CRITICAL VALUE NOTED.  VALUE  IS CONSISTENT WITH PREVIOUSLY REPORTED AND CALLED VALUE.    Culture (A)  Final    STAPHYLOCOCCUS EPIDERMIDIS SUSCEPTIBILITIES PERFORMED ON PREVIOUS CULTURE WITHIN THE LAST 5 DAYS. Performed at [facility], [place] [address] [place], [place] [address]    Report Status 01/27/2019 FINAL  Final  Blood culture (routine x 2)     Status: Abnormal   Collection Time: 01/24/19  4:15 AM   Specimen: BLOOD  Result Value Ref Range Status   Specimen Description   Final    BLOOD BLOOD RIGHT FOREARM Performed at [facility] [address], [place], [place] [address]    Special Requests   Final    BOTTLES DRAWN AEROBIC AND ANAEROBIC  Blood Culture adequate volume Performed at [facility] [address] [place], [place] [address]    Culture  Setup Time   Final    GRAM POSITIVE COCCI IN BOTH AEROBIC AND ANAEROBIC BOTTLES CRITICAL RESULT CALLED TO, READ BACK BY AND VERIFIED WITH: PHRMD L GREEN @ 2542 01/25/19 BY S GEZAHEGN Performed at [facility], [place] [address] [place], [place] [address]    Culture STAPHYLOCOCCUS EPIDERMIDIS (A)  Final   Report Status 01/27/2019 FINAL  Final   Organism ID, Bacteria STAPHYLOCOCCUS EPIDERMIDIS  Final      Susceptibility   Staphylococcus epidermidis - MIC*    CIPROFLOXACIN <=0.5 SENSITIVE Sensitive     ERYTHROMYCIN >=8 RESISTANT Resistant     GENTAMICIN <=0.5 SENSITIVE Sensitive     OXACILLIN >=4 RESISTANT Resistant     TETRACYCLINE >=16 RESISTANT Resistant     VANCOMYCIN 1 SENSITIVE Sensitive     TRIMETH/SULFA <=10 SENSITIVE Sensitive     CLINDAMYCIN >=8 RESISTANT Resistant     RIFAMPIN <=0.5 SENSITIVE Sensitive     Inducible Clindamycin NEGATIVE Sensitive     * STAPHYLOCOCCUS EPIDERMIDIS  Blood Culture ID Panel (Reflexed)     Status: Abnormal   Collection Time: 01/24/19  4:15 AM  Result Value Ref Range Status   Enterococcus species NOT DETECTED NOT DETECTED Final   Listeria monocytogenes NOT DETECTED  NOT DETECTED Final   Staphylococcus species DETECTED (A) NOT DETECTED Final    Comment: Methicillin (oxacillin) resistant coagulase negative staphylococcus. Possible blood culture contaminant (unless isolated from more than one blood culture draw or clinical case suggests pathogenicity). No antibiotic treatment is indicated for blood  culture contaminants. CRITICAL RESULT CALLED TO, READ BACK BY AND VERIFIED WITH: PHRMD L GREEN @ 7628 01/25/19 BY S GEZAHEGN    Staphylococcus aureus (BCID) NOT DETECTED NOT DETECTED Final   Methicillin resistance DETECTED (A) NOT DETECTED Final    Comment: CRITICAL RESULT CALLED TO, READ BACK BY AND VERIFIED WITH: PHRMD L GREEN @ 3151 01/25/19 BY S GEZAHEGN    Streptococcus species NOT DETECTED NOT DETECTED Final   Streptococcus agalactiae NOT DETECTED NOT DETECTED Final   Streptococcus pneumoniae NOT DETECTED NOT DETECTED Final   Streptococcus pyogenes NOT DETECTED NOT DETECTED Final   Acinetobacter baumannii NOT DETECTED NOT DETECTED Final   Enterobacteriaceae species NOT DETECTED NOT DETECTED Final   Enterobacter cloacae complex NOT DETECTED NOT DETECTED Final   Escherichia coli NOT DETECTED NOT DETECTED Final   Klebsiella oxytoca NOT DETECTED NOT DETECTED Final   Klebsiella pneumoniae NOT DETECTED NOT DETECTED Final   Proteus species NOT DETECTED NOT DETECTED Final   Serratia marcescens NOT DETECTED NOT DETECTED Final   Haemophilus influenzae NOT DETECTED NOT DETECTED Final   Neisseria meningitidis NOT DETECTED NOT DETECTED Final   Pseudomonas aeruginosa NOT DETECTED NOT DETECTED Final   Candida albicans NOT DETECTED NOT DETECTED Final   Candida glabrata NOT DETECTED NOT DETECTED Final   Candida krusei NOT DETECTED NOT DETECTED Final   Candida parapsilosis NOT DETECTED NOT DETECTED Final   Candida tropicalis NOT DETECTED NOT DETECTED Final    Comment: Performed at [facility], [place]. [address] [place], [place] [address]  Culture, blood  (routine x 2)     Status: None (Preliminary result)   Collection Time: 01/25/19  8:37 AM   Specimen: BLOOD  Result Value Ref Range Status   Specimen Description   Final    BLOOD RIGHT ARM Performed at [facility],  [place] [address], [place], [place] [address]    Special Requests   Final    BOTTLES DRAWN AEROBIC AND ANAEROBIC Blood Culture adequate volume Performed at [facility] [address] [place], [place] [address]    Culture   Final    NO GROWTH 3 DAYS Performed at [facility], [place] [address], [place], [place] [address]    Report Status PENDING  Incomplete  Culture, blood (routine x 2)     Status: None (Preliminary result)   Collection Time: 01/25/19  8:45 AM   Specimen: BLOOD RIGHT HAND  Result Value Ref Range Status   Specimen Description   Final    BLOOD RIGHT HAND Performed at [facility] [address] [place], [place] [address]    Special Requests   Final    BOTTLES DRAWN AEROBIC AND ANAEROBIC Blood Culture adequate volume Performed at [facility] [address], [place], [place] [address]    Culture   Final    NO GROWTH 3 DAYS Performed at [facility], [place] [address] [place], [place] [address]    Report Status PENDING  Incomplete  MRSA PCR Screening     Status: Abnormal   Collection Time: 01/25/19  6:29 PM   Specimen: Urine, Random; Nasopharyngeal  Result Value Ref Range Status   MRSA by PCR POSITIVE (A) NEGATIVE Final    Comment:        The GeneXpert MRSA Assay (FDA approved for NASAL specimens only), is one component of a comprehensive MRSA colonization surveillance program. It is not intended to diagnose MRSA infection nor to guide or monitor treatment for MRSA infections. RESULT CALLED TO, READ BACK BY AND VERIFIED WITH: J.JALLOH AT 2242 ON 01/25/19 BY N.THOMPSON Performed at [facility], [place] [address], [place], [place] [address]    Urine Culture     Status: Abnormal   Collection Time: 01/25/19  6:30 PM   Specimen: Urine, Random  Result Value Ref Range Status   Specimen Description   Final    URINE, RANDOM Performed at [facility] [address] [place], [place] [address]    Special Requests   Final    NONE Performed at [facility], [place] [address] [place], [place] [address]    Culture 50,000 COLONIES/mL PSEUDOMONAS AERUGINOSA (A)  Final   Report Status 01/28/2019 FINAL  Final   Organism ID, Bacteria PSEUDOMONAS AERUGINOSA (A)  Final      Susceptibility   Pseudomonas aeruginosa - MIC*    CEFTAZIDIME >=64 RESISTANT Resistant     CIPROFLOXACIN <=0.25 SENSITIVE Sensitive     GENTAMICIN 8 INTERMEDIATE Intermediate     IMIPENEM 2 SENSITIVE Sensitive     CEFEPIME 16 INTERMEDIATE Intermediate     * 50,000 COLONIES/mL PSEUDOMONAS AERUGINOSA         Radiology Studies: No results found.      Scheduled Meds:  buPROPion  300 mg Oral Daily   enoxaparin (LOVENOX) injection  30 mg Subcutaneous Q24H   escitalopram  5 mg Oral Daily   feeding supplement  1 Container Oral TID BM   feeding supplement (PRO-STAT SUGAR FREE 64)  30 mL Oral TID   lidocaine  1 patch Transdermal Q24H   Melatonin  3 mg Oral QHS   multivitamin with minerals  1 tablet Oral Daily   pantoprazole  40 mg Oral Daily   pravastatin  20 mg Oral Daily  saccharomyces boulardii  250 mg Oral BID   vancomycin  125 mg Oral QID   Continuous Infusions:  sodium chloride 50 mL/hr at 01/28/19 1040   vancomycin 750 mg (01/28/19 0545)     LOS: 3 days    Time spent: 25 minutes    Barb Merino, MD [facility]

## 2019-01-28 NOTE — Progress Notes (Signed)
Patient states that she thinks she had a reaction to pepperoni on a sandwich from [place]. Patient and son reports flushing of face and feeling like face and mouth are swollen. No s/o distress or difficulty breathing noted. MD paged with return call with order for 1 time dose of iv benadryl. Oncoming nurse to be made aware.

## 2019-01-29 ENCOUNTER — Inpatient Hospital Stay: Payer: Self-pay

## 2019-01-29 LAB — CBC WITH DIFFERENTIAL/PLATELET
Abs Immature Granulocytes: 0.04 10*3/uL (ref 0.00–0.07)
Basophils Absolute: 0 10*3/uL (ref 0.0–0.1)
Basophils Relative: 1 %
Eosinophils Absolute: 0 10*3/uL (ref 0.0–0.5)
Eosinophils Relative: 0 %
HCT: 30.8 % — ABNORMAL LOW (ref 36.0–46.0)
Hemoglobin: 9.1 g/dL — ABNORMAL LOW (ref 12.0–15.0)
Immature Granulocytes: 1 %
Lymphocytes Relative: 26 %
Lymphs Abs: 1.2 10*3/uL (ref 0.7–4.0)
MCH: 27.5 pg (ref 26.0–34.0)
MCHC: 29.5 g/dL — ABNORMAL LOW (ref 30.0–36.0)
MCV: 93.1 fL (ref 80.0–100.0)
Monocytes Absolute: 0.3 10*3/uL (ref 0.1–1.0)
Monocytes Relative: 7 %
Neutro Abs: 3.1 10*3/uL (ref 1.7–7.7)
Neutrophils Relative %: 65 %
Platelets: 235 10*3/uL (ref 150–400)
RBC: 3.31 MIL/uL — ABNORMAL LOW (ref 3.87–5.11)
RDW: 16.4 % — ABNORMAL HIGH (ref 11.5–15.5)
WBC: 4.7 10*3/uL (ref 4.0–10.5)
nRBC: 0 % (ref 0.0–0.2)

## 2019-01-29 LAB — BASIC METABOLIC PANEL
Anion gap: 7 (ref 5–15)
BUN: 23 mg/dL (ref 8–23)
CO2: 16 mmol/L — ABNORMAL LOW (ref 22–32)
Calcium: 9.5 mg/dL (ref 8.9–10.3)
Chloride: 112 mmol/L — ABNORMAL HIGH (ref 98–111)
Creatinine, Ser: 0.81 mg/dL (ref 0.44–1.00)
GFR calc Af Amer: >60 mL/min (ref >60)
GFR calc non Af Amer: >60 mL/min (ref >60)
Glucose, Bld: 91 mg/dL (ref 70–99)
Potassium: 3.5 mmol/L (ref 3.5–5.1)
Sodium: 135 mmol/L (ref 135–145)

## 2019-01-29 LAB — PHOSPHORUS: Phosphorus: 3.8 mg/dL (ref 2.5–4.6)

## 2019-01-29 LAB — MAGNESIUM: Magnesium: 1.8 mg/dL (ref 1.7–2.4)

## 2019-01-29 NOTE — Progress Notes (Signed)
PROGRESS NOTE    Katrina Boyer  OEH:[id number] DOB: 1950-07-17 DOA: 01/24/2019 PCP: Seward Carol, MD    Brief Narrative:  68 year old female with severe medically refractory Crohn's disease with fistulas, on TPN and ultimate plan for total colectomy, currently at SNF, recent Pseudomonas bacteremia, MRSA pneumonia who presented from skilled nursing facility to the emergency room on request of her GI physician with positive blood cultures collected on 01/23/2019.  Patient is recovering at a SNF with her previous bacteremia, she had a PICC line, she also has C. difficile and on prolonged vancomycin taper, chronic diarrhea, had routine follow-up due to episode of chills last week, blood cultures drawn that were positive for MRSE and was asked to come to the nearest emergency room. In the emergency room, patient was hemodynamically stable.  No leukocytosis.  Admitted for management of bacteremia.  Assessment & Plan:   Principal Problem:   Bacteremia Active Problems:   Crohn's disease ([place])   MRSA bacteremia  MRSE bacteremia due to presence of PICC line. indwelling PICC line with TPN: 4 out of 4 bottles positive for MRSE. Hemodynamically stable today. On vancomycin  Repeat blood cultures no growth so far from 01/25/2019. TTE, no vegetation. Using peripheral IV line, holding off on PICC line until cultures improve. Appreciate infectious disease input. Waiting for ID plan to finalize antibiotics.  Chronic recurrent C. difficile colitis: Patient will stay on vancomycin by mouth, will continue vancomycin by mouth and prolonged taper until she is on antibiotics.  Moderate protein calorie malnutrition: On TPN.  Discontinued due to bacteremia.  Will allow bacteremia to clear before inserting another line.  Will allow regular diet and supplements.  Severe Crohn's disease with fistulas: Follows up at [facility] and surgery.  She is getting prepared for surgery, waiting for nutrition to improve.  She will  follow-up once stable.  Anemia of chronic disease: Hemoglobin was 6.9.  Received 1 unit of PRBC with appropriate clinical response.  We will continue to monitor. Iron levels, ferritin and B12 is normal.   Abnormal urinalysis: Growing Pseudomonas.  Probably colonized.    DVT prophylaxis: Lovenox subcu Code Status: Full code Family Communication: None. Disposition Plan: Unknown.  Back to skilled nursing facility.  May need to go back with TPN and IV antibiotics.  Pending ID plan.   Consultants:   Infectious disease  Procedures:   None  Antimicrobials:   Vancomycin, 01/24/2019---  Cefepime, 01/24/2019--- 01/25/2019   Subjective: Patient seen and examined.  No overnight events.  Objective: Vitals:   01/28/19 1256 01/28/19 2205 01/29/19 0618 01/29/19 1311  BP: (!) 152/85 137/69 (!) 141/71 123/82  Pulse: 76 78 82 81  Resp: 18 16 17 18   Temp: 98.6 F (37 C) 98.1 F (36.7 C) 98.3 F (36.8 C) 98.7 F (37.1 C)  TempSrc: Oral Oral Oral Oral  SpO2: 100% 99% 100% 98%  Weight:      Height:        Intake/Output Summary (Last 24 hours) at 01/29/2019 1319 Last data filed at 01/29/2019 0800 Gross per 24 hour  Intake 480 ml  Output 2 ml  Net 478 ml   Filed Weights   01/24/19 0239  Weight: 45.8 kg    Examination:  General exam: Appears calm and comfortable, chronically sick looking.  On room air. Respiratory system: Clear to auscultation. Respiratory effort normal.  No added sounds Cardiovascular system: S1 & S2 heard, RRR. No JVD, murmurs, rubs, gallops or clicks. No pedal edema. Gastrointestinal system: Abdomen is nondistended,  soft and nontender. No organomegaly or masses felt. Normal bowel sounds heard. Central nervous system: Alert and oriented. No focal neurological deficits. Extremities: Symmetric 5 x 5 power.  Right hip tenderness without swelling or effusions or deformity. Skin: No rashes, lesions or ulcers Psychiatry: Judgement and insight appear normal. Mood  & affect appropriate.     Data Reviewed: I have personally reviewed following labs and imaging studies  CBC: Recent Labs  Lab 01/24/19 0253 01/25/19 0836 01/25/19 1349 01/26/19 0531 01/26/19 1538 01/29/19 0506  WBC 6.7 4.3  --  3.4*  --  4.7  NEUTROABS 4.9  --   --  2.0  --  3.1  HGB 7.6* 7.0*  --  6.9* 9.2* 9.1*  HCT 25.4* 23.7* 23.2* 22.6* 30.7* 30.8*  MCV 92.0 92.9  --  92.6  --  93.1  PLT 234 224  --  199  --  741   Basic Metabolic Panel: Recent Labs  Lab 01/24/19 0253 01/25/19 0836 01/26/19 0531 01/29/19 0506  NA 129* 130* 133* 135  K 4.6 4.5 3.9 3.5  CL 101 107 111 112*  CO2 19* 17* 15* 16*  GLUCOSE 102* 94 94 91  BUN 57* 41* 27* 23  CREATININE 0.97 0.97 0.78 0.81  CALCIUM 9.4 9.4 9.4 9.5  MG  --   --  1.8 1.8  PHOS  --   --  3.9 3.8   GFR: Estimated Creatinine Clearance: 50.1 mL/min (by C-G formula based on SCr of 0.81 mg/dL). Liver Function Tests: Recent Labs  Lab 01/24/19 0253 01/25/19 0836 01/26/19 0531  AST 52* 35 30  ALT 66* 47* 38  ALKPHOS 169* 139* 127*  BILITOT 0.9 0.9 0.3  PROT 7.5 6.8 6.4*  ALBUMIN 2.4* 2.1* 2.0*   No results for input(s): LIPASE, AMYLASE in the last 168 hours. No results for input(s): AMMONIA in the last 168 hours. Coagulation Profile: No results for input(s): INR, PROTIME in the last 168 hours. Cardiac Enzymes: No results for input(s): CKTOTAL, CKMB, CKMBINDEX, TROPONINI in the last 168 hours. BNP (last 3 results) No results for input(s): PROBNP in the last 8760 hours. HbA1C: No results for input(s): HGBA1C in the last 72 hours. CBG: Recent Labs  Lab 01/27/19 0739 01/27/19 1604 01/27/19 2254 01/28/19 0730 01/28/19 1607  GLUCAP 104* 139* 163* 91 132*   Lipid Profile: No results for input(s): CHOL, HDL, LDLCALC, TRIG, CHOLHDL, LDLDIRECT in the last 72 hours. Thyroid Function Tests: No results for input(s): TSH, T4TOTAL, FREET4, T3FREE, THYROIDAB in the last 72 hours. Anemia Panel: No results for  input(s): VITAMINB12, FOLATE, FERRITIN, TIBC, IRON, RETICCTPCT in the last 72 hours. Sepsis Labs: Recent Labs  Lab 01/24/19 0411  LATICACIDVEN 1.0    Recent Results (from the past 240 hour(s))  SARS Coronavirus 2 by RT PCR (hospital order, performed in [facility] hospital lab) Nasopharyngeal Nasopharyngeal Swab     Status: None   Collection Time: 01/24/19  4:11 AM   Specimen: Nasopharyngeal Swab  Result Value Ref Range Status   SARS Coronavirus 2 NEGATIVE NEGATIVE Final    Comment: (NOTE) If result is NEGATIVE SARS-CoV-2 target nucleic acids are NOT DETECTED. The SARS-CoV-2 RNA is generally detectable in upper and lower  respiratory specimens during the acute phase of infection. The lowest  concentration of SARS-CoV-2 viral copies this assay can detect is 250  copies / mL. A negative result does not preclude SARS-CoV-2 infection  and should not be used as the sole basis for treatment or other  patient management decisions.  A negative result may occur with  improper specimen collection / handling, submission of specimen other  than nasopharyngeal swab, presence of viral mutation(s) within the  areas targeted by this assay, and inadequate number of viral copies  (<250 copies / mL). A negative result must be combined with clinical  observations, patient history, and epidemiological information. If result is POSITIVE SARS-CoV-2 target nucleic acids are DETECTED. The SARS-CoV-2 RNA is generally detectable in upper and lower  respiratory specimens dur ing the acute phase of infection.  Positive  results are indicative of active infection with SARS-CoV-2.  Clinical  correlation with patient history and other diagnostic information is  necessary to determine patient infection status.  Positive results do  not rule out bacterial infection or co-infection with other viruses. If result is PRESUMPTIVE POSTIVE SARS-CoV-2 nucleic acids MAY BE PRESENT.   A presumptive positive result was  obtained on the submitted specimen  and confirmed on repeat testing.  While 2019 novel coronavirus  (SARS-CoV-2) nucleic acids may be present in the submitted sample  additional confirmatory testing may be necessary for epidemiological  and / or clinical management purposes  to differentiate between  SARS-CoV-2 and other Sarbecovirus currently known to infect humans.  If clinically indicated additional testing with an alternate test  methodology [phone]) is advised. The SARS-CoV-2 RNA is generally  detectable in upper and lower respiratory sp ecimens during the acute  phase of infection. The expected result is Negative. Fact Sheet for Patients:  StrictlyIdeas.no Fact Sheet for Healthcare Providers: BankingDealers.co.za This test is not yet approved or cleared by the [place] FDA and has been authorized for detection and/or diagnosis of SARS-CoV-2 by FDA under an Emergency Use Authorization (EUA).  This EUA will remain in effect (meaning this test can be used) for the duration of the COVID-19 declaration under Section 564(b)(1) of the Act, 21 U.S.C. section 360bbb-3(b)(1), unless the authorization is terminated or revoked sooner. Performed at [facility], [place] [address] [place], [place] [address]   Blood culture (routine x 2)     Status: Abnormal   Collection Time: 01/24/19  4:12 AM   Specimen: BLOOD  Result Value Ref Range Status   Specimen Description   Final    BLOOD LEFT ANTECUBITAL Performed at [facility] [address], [place], [place] [address]    Special Requests   Final    BOTTLES DRAWN AEROBIC AND ANAEROBIC Blood Culture adequate volume Performed at [facility] [address], [place], [place] [address]    Culture  Setup Time   Final    GRAM POSITIVE COCCI IN BOTH AEROBIC AND ANAEROBIC BOTTLES CRITICAL VALUE NOTED.  VALUE IS CONSISTENT WITH PREVIOUSLY  REPORTED AND CALLED VALUE.    Culture (A)  Final    STAPHYLOCOCCUS EPIDERMIDIS SUSCEPTIBILITIES PERFORMED ON PREVIOUS CULTURE WITHIN THE LAST 5 DAYS. Performed at [facility], [place] [address], [place], [place] [address]    Report Status 01/27/2019 FINAL  Final  Blood culture (routine x 2)     Status: Abnormal   Collection Time: 01/24/19  4:15 AM   Specimen: BLOOD  Result Value Ref Range Status   Specimen Description   Final    BLOOD BLOOD RIGHT FOREARM Performed at [facility] [address] [place], [place] [address]    Special Requests   Final    BOTTLES DRAWN AEROBIC AND ANAEROBIC Blood Culture adequate volume Performed at [facility], [place] [address] [place], [place] [address]    Culture  Setup Time   Final    GRAM POSITIVE COCCI IN BOTH AEROBIC AND ANAEROBIC BOTTLES CRITICAL RESULT CALLED TO, READ BACK BY AND VERIFIED WITH: PHRMD L GREEN @ 6283 01/25/19 BY S GEZAHEGN Performed at [facility], [place] [address] [place], [place] [address]    Culture STAPHYLOCOCCUS EPIDERMIDIS (A)  Final   Report Status 01/27/2019 FINAL  Final   Organism ID, Bacteria STAPHYLOCOCCUS EPIDERMIDIS  Final      Susceptibility   Staphylococcus epidermidis - MIC*    CIPROFLOXACIN <=0.5 SENSITIVE Sensitive     ERYTHROMYCIN >=8 RESISTANT Resistant     GENTAMICIN <=0.5 SENSITIVE Sensitive     OXACILLIN >=4 RESISTANT Resistant     TETRACYCLINE >=16 RESISTANT Resistant     VANCOMYCIN 1 SENSITIVE Sensitive     TRIMETH/SULFA <=10 SENSITIVE Sensitive     CLINDAMYCIN >=8 RESISTANT Resistant     RIFAMPIN <=0.5 SENSITIVE Sensitive     Inducible Clindamycin NEGATIVE Sensitive     * STAPHYLOCOCCUS EPIDERMIDIS  Blood Culture ID Panel (Reflexed)     Status: Abnormal   Collection Time: 01/24/19  4:15 AM  Result Value Ref Range Status   Enterococcus species NOT DETECTED NOT DETECTED Final   Listeria monocytogenes NOT DETECTED NOT DETECTED Final    Staphylococcus species DETECTED (A) NOT DETECTED Final    Comment: Methicillin (oxacillin) resistant coagulase negative staphylococcus. Possible blood culture contaminant (unless isolated from more than one blood culture draw or clinical case suggests pathogenicity). No antibiotic treatment is indicated for blood  culture contaminants. CRITICAL RESULT CALLED TO, READ BACK BY AND VERIFIED WITH: PHRMD L GREEN @ 1607 01/25/19 BY S GEZAHEGN    Staphylococcus aureus (BCID) NOT DETECTED NOT DETECTED Final   Methicillin resistance DETECTED (A) NOT DETECTED Final    Comment: CRITICAL RESULT CALLED TO, READ BACK BY AND VERIFIED WITH: PHRMD L GREEN @ 3710 01/25/19 BY S GEZAHEGN    Streptococcus species NOT DETECTED NOT DETECTED Final   Streptococcus agalactiae NOT DETECTED NOT DETECTED Final   Streptococcus pneumoniae NOT DETECTED NOT DETECTED Final   Streptococcus pyogenes NOT DETECTED NOT DETECTED Final   Acinetobacter baumannii NOT DETECTED NOT DETECTED Final   Enterobacteriaceae species NOT DETECTED NOT DETECTED Final   Enterobacter cloacae complex NOT DETECTED NOT DETECTED Final   Escherichia coli NOT DETECTED NOT DETECTED Final   Klebsiella oxytoca NOT DETECTED NOT DETECTED Final   Klebsiella pneumoniae NOT DETECTED NOT DETECTED Final   Proteus species NOT DETECTED NOT DETECTED Final   Serratia marcescens NOT DETECTED NOT DETECTED Final   Haemophilus influenzae NOT DETECTED NOT DETECTED Final   Neisseria meningitidis NOT DETECTED NOT DETECTED Final   Pseudomonas aeruginosa NOT DETECTED NOT DETECTED Final   Candida albicans NOT DETECTED NOT DETECTED Final   Candida glabrata NOT DETECTED NOT DETECTED Final   Candida krusei NOT DETECTED NOT DETECTED Final   Candida parapsilosis NOT DETECTED NOT DETECTED Final   Candida tropicalis NOT DETECTED NOT DETECTED Final    Comment: Performed at [facility], [place]. [address], [place], [place] [address]  Culture, blood (routine x 2)     Status:  None (Preliminary result)   Collection Time: 01/25/19  8:37 AM   Specimen: BLOOD  Result Value Ref Range Status   Specimen Description   Final    BLOOD RIGHT ARM Performed at [facility] [address] [place], [place] [address]  Special Requests   Final    BOTTLES DRAWN AEROBIC AND ANAEROBIC Blood Culture adequate volume Performed at [facility] [address] [place], [place] [address]    Culture   Final    NO GROWTH 4 DAYS Performed at [facility], [place] [address] [place], [place] [address]    Report Status PENDING  Incomplete  Culture, blood (routine x 2)     Status: None (Preliminary result)   Collection Time: 01/25/19  8:45 AM   Specimen: BLOOD RIGHT HAND  Result Value Ref Range Status   Specimen Description   Final    BLOOD RIGHT HAND Performed at [facility] [address] [place], [place] [address]    Special Requests   Final    BOTTLES DRAWN AEROBIC AND ANAEROBIC Blood Culture adequate volume Performed at [facility] [address] [place], [place] [address]    Culture   Final    NO GROWTH 4 DAYS Performed at [facility], [place] [address], [place], [place] [address]    Report Status PENDING  Incomplete  MRSA PCR Screening     Status: Abnormal   Collection Time: 01/25/19  6:29 PM   Specimen: Urine, Random; Nasopharyngeal  Result Value Ref Range Status   MRSA by PCR POSITIVE (A) NEGATIVE Final    Comment:        The GeneXpert MRSA Assay (FDA approved for NASAL specimens only), is one component of a comprehensive MRSA colonization surveillance program. It is not intended to diagnose MRSA infection nor to guide or monitor treatment for MRSA infections. RESULT CALLED TO, READ BACK BY AND VERIFIED WITH: J.JALLOH AT 2242 ON 01/25/19 BY N.THOMPSON Performed at [facility], [place] [address] [place], [place] [address]   Urine Culture     Status:  Abnormal   Collection Time: 01/25/19  6:30 PM   Specimen: Urine, Random  Result Value Ref Range Status   Specimen Description   Final    URINE, RANDOM Performed at [facility] [address], [place], [place] [address]    Special Requests   Final    NONE Performed at [facility], [place] [address], [place], [place] [address]    Culture 50,000 COLONIES/mL PSEUDOMONAS AERUGINOSA (A)  Final   Report Status 01/28/2019 FINAL  Final   Organism ID, Bacteria PSEUDOMONAS AERUGINOSA (A)  Final      Susceptibility   Pseudomonas aeruginosa - MIC*    CEFTAZIDIME >=64 RESISTANT Resistant     CIPROFLOXACIN <=0.25 SENSITIVE Sensitive     GENTAMICIN 8 INTERMEDIATE Intermediate     IMIPENEM 2 SENSITIVE Sensitive     CEFEPIME 16 INTERMEDIATE Intermediate     * 50,000 COLONIES/mL PSEUDOMONAS AERUGINOSA         Radiology Studies: No results found.      Scheduled Meds:  buPROPion  300 mg Oral Daily   enoxaparin (LOVENOX) injection  30 mg Subcutaneous Q24H   escitalopram  5 mg Oral Daily   feeding supplement  1 Container Oral TID BM   feeding supplement (PRO-STAT SUGAR FREE 64)  30 mL Oral TID   lidocaine  1 patch Transdermal Q24H   Melatonin  3 mg Oral QHS   multivitamin with minerals  1 tablet Oral Daily   pantoprazole  40 mg Oral Daily   pravastatin  20 mg Oral Daily   saccharomyces boulardii  250 mg Oral BID   vancomycin  125 mg Oral QID   Continuous Infusions:  sodium chloride 50 mL/hr at 01/29/19 0611   vancomycin 750 mg (01/29/19 0611)     LOS: 4 days    Time spent: 25 minutes    Barb Merino, MD [facility]

## 2019-01-29 NOTE — Care Management Important Message (Signed)
Important Message  Patient Details IM Letter given to [name] RN to present to the Patient Name: Katrina Boyer MRN: [id number] Date of Birth: February 20, 1951   Medicare Important Message Given:  Yes     Kerin Salen 01/29/2019, 9:26 AM

## 2019-01-29 NOTE — Progress Notes (Signed)
PHARMACY CONSULT NOTE FOR:  OUTPATIENT  PARENTERAL ANTIBIOTIC THERAPY (OPAT)  Indication: MRSE Bacteremia Regimen: Vancomycin dose to be determine 10/20 End date: 02/07/19  Vancomycin levels ordered for 10/20 to evaluate current dose of vancomycin 750 mg IV q24  Thank you for allowing pharmacy to be a part of this patient's care.  Eudelia Bunch, Pharm.D [phone] 01/29/2019 5:07 PM

## 2019-01-29 NOTE — Progress Notes (Signed)
INFECTIOUS DISEASE PROGRESS NOTE  ID: Katrina Boyer is a 68 y.o. female with  Principal Problem:   Bacteremia Active Problems:   Crohn's disease ([place])   MRSA bacteremia  Subjective: No complaints  Wants to go home Loose bm 2x/day.   Abtx:  Anti-infectives (From admission, onward)   Start     Dose/Rate Route Frequency Ordered Stop   01/27/19 0600  vancomycin (VANCOCIN) IVPB 750 mg/150 ml premix     750 mg 150 mL/hr over 60 Minutes Intravenous Every 24 hours 01/26/19 1314     01/25/19 0600  vancomycin (VANCOCIN) 500 mg in sodium chloride 0.9 % 100 mL IVPB  Status:  Discontinued     500 mg 100 mL/hr over 60 Minutes Intravenous Every 24 hours 01/24/19 0831 01/26/19 1314   01/24/19 1700  ceFEPIme (MAXIPIME) 2 g in sodium chloride 0.9 % 100 mL IVPB  Status:  Discontinued     2 g 200 mL/hr over 30 Minutes Intravenous Every 12 hours 01/24/19 0831 01/25/19 0729   01/24/19 1000  vancomycin (VANCOCIN) 50 mg/mL oral solution 125 mg     125 mg Oral 4 times daily 01/24/19 0755     01/24/19 0445  ceFEPIme (MAXIPIME) 2 g in sodium chloride 0.9 % 100 mL IVPB     2 g 200 mL/hr over 30 Minutes Intravenous  Once 01/24/19 0434 01/24/19 0537   01/24/19 0300  vancomycin (VANCOCIN) IVPB 1000 mg/200 mL premix     1,000 mg 200 mL/hr over 60 Minutes Intravenous  Once 01/24/19 0256 01/24/19 0708      Medications:  Scheduled: . buPROPion  300 mg Oral Daily  . enoxaparin (LOVENOX) injection  30 mg Subcutaneous Q24H  . escitalopram  5 mg Oral Daily  . feeding supplement  1 Container Oral TID BM  . feeding supplement (PRO-STAT SUGAR FREE 64)  30 mL Oral TID  . lidocaine  1 patch Transdermal Q24H  . Melatonin  3 mg Oral QHS  . multivitamin with minerals  1 tablet Oral Daily  . pantoprazole  40 mg Oral Daily  . pravastatin  20 mg Oral Daily  . saccharomyces boulardii  250 mg Oral BID  . vancomycin  125 mg Oral QID    Objective: Vital signs in last 24 hours: Temp:  [98.1 F (36.7 C)-98.7 F  (37.1 C)] 98.7 F (37.1 C) (10/19 1311) Pulse Rate:  [78-82] 81 (10/19 1311) Resp:  [16-18] 18 (10/19 1311) BP: (123-141)/(69-82) 123/82 (10/19 1311) SpO2:  [98 %-100 %] 98 % (10/19 1311)   General appearance: alert, cooperative, cachectic and fatigued Resp: clear to auscultation bilaterally Cardio: regular rate and rhythm, S1, S2 normal, no murmur, click, rub or gallop GI: soft, non-tender; bowel sounds normal; no masses,  no organomegaly  Lab Results Recent Labs    01/29/19 0506  WBC 4.7  HGB 9.1*  HCT 30.8*  NA 135  K 3.5  CL 112*  CO2 16*  BUN 23  CREATININE 0.81   Liver Panel No results for input(s): PROT, ALBUMIN, AST, ALT, ALKPHOS, BILITOT, BILIDIR, IBILI in the last 72 hours. Sedimentation Rate No results for input(s): ESRSEDRATE in the last 72 hours. C-Reactive Protein No results for input(s): CRP in the last 72 hours.  Microbiology: Recent Results (from the past 240 hour(s))  SARS Coronavirus 2 by RT PCR (hospital order, performed in [facility] hospital lab) Nasopharyngeal Nasopharyngeal Swab     Status: None   Collection Time: 01/24/19  4:11 AM   Specimen:  Nasopharyngeal Swab  Result Value Ref Range Status   SARS Coronavirus 2 NEGATIVE NEGATIVE Final    Comment: (NOTE) If result is NEGATIVE SARS-CoV-2 target nucleic acids are NOT DETECTED. The SARS-CoV-2 RNA is generally detectable in upper and lower  respiratory specimens during the acute phase of infection. The lowest  concentration of SARS-CoV-2 viral copies this assay can detect is 250  copies / mL. A negative result does not preclude SARS-CoV-2 infection  and should not be used as the sole basis for treatment or other  patient management decisions.  A negative result may occur with  improper specimen collection / handling, submission of specimen other  than nasopharyngeal swab, presence of viral mutation(s) within the  areas targeted by this assay, and inadequate number of viral copies  (<250  copies / mL). A negative result must be combined with clinical  observations, patient history, and epidemiological information. If result is POSITIVE SARS-CoV-2 target nucleic acids are DETECTED. The SARS-CoV-2 RNA is generally detectable in upper and lower  respiratory specimens dur ing the acute phase of infection.  Positive  results are indicative of active infection with SARS-CoV-2.  Clinical  correlation with patient history and other diagnostic information is  necessary to determine patient infection status.  Positive results do  not rule out bacterial infection or co-infection with other viruses. If result is PRESUMPTIVE POSTIVE SARS-CoV-2 nucleic acids MAY BE PRESENT.   A presumptive positive result was obtained on the submitted specimen  and confirmed on repeat testing.  While 2019 novel coronavirus  (SARS-CoV-2) nucleic acids may be present in the submitted sample  additional confirmatory testing may be necessary for epidemiological  and / or clinical management purposes  to differentiate between  SARS-CoV-2 and other Sarbecovirus currently known to infect humans.  If clinically indicated additional testing with an alternate test  methodology [phone]) is advised. The SARS-CoV-2 RNA is generally  detectable in upper and lower respiratory sp ecimens during the acute  phase of infection. The expected result is Negative. Fact Sheet for Patients:  StrictlyIdeas.no Fact Sheet for Healthcare Providers: BankingDealers.co.za This test is not yet approved or cleared by the [place] FDA and has been authorized for detection and/or diagnosis of SARS-CoV-2 by FDA under an Emergency Use Authorization (EUA).  This EUA will remain in effect (meaning this test can be used) for the duration of the COVID-19 declaration under Section 564(b)(1) of the Act, 21 U.S.C. section 360bbb-3(b)(1), unless the authorization is terminated or revoked  sooner. Performed at [facility], [place] [address], [place], [place] [address]   Blood culture (routine x 2)     Status: Abnormal   Collection Time: 01/24/19  4:12 AM   Specimen: BLOOD  Result Value Ref Range Status   Specimen Description   Final    BLOOD LEFT ANTECUBITAL Performed at [facility] [address], [place], [place] [address]    Special Requests   Final    BOTTLES DRAWN AEROBIC AND ANAEROBIC Blood Culture adequate volume Performed at [facility] [address], [place],  [address]    Culture  Setup Time   Final    GRAM POSITIVE COCCI IN BOTH AEROBIC AND ANAEROBIC BOTTLES CRITICAL VALUE NOTED.  VALUE IS CONSISTENT WITH PREVIOUSLY REPORTED AND CALLED VALUE.    Culture (A)  Final    STAPHYLOCOCCUS EPIDERMIDIS SUSCEPTIBILITIES PERFORMED ON PREVIOUS CULTURE WITHIN THE LAST 5 DAYS. Performed at [facility], [place] [address] [place], [place]  [address]    Report Status 01/27/2019 FINAL  Final  Blood culture (routine x 2)     Status: Abnormal   Collection Time: 01/24/19  4:15 AM   Specimen: BLOOD  Result Value Ref Range Status   Specimen Description   Final    BLOOD BLOOD RIGHT FOREARM Performed at [facility] [address], [place], [place] [address]    Special Requests   Final    BOTTLES DRAWN AEROBIC AND ANAEROBIC Blood Culture adequate volume Performed at [facility] [address] [place], [place] [address]    Culture  Setup Time   Final    GRAM POSITIVE COCCI IN BOTH AEROBIC AND ANAEROBIC BOTTLES CRITICAL RESULT CALLED TO, READ BACK BY AND VERIFIED WITH: PHRMD L GREEN @ 1638 01/25/19 BY S GEZAHEGN Performed at [facility], [place] [address] [place], [place] [address]    Culture STAPHYLOCOCCUS EPIDERMIDIS (A)  Final   Report Status 01/27/2019 FINAL  Final   Organism ID, Bacteria STAPHYLOCOCCUS EPIDERMIDIS  Final      Susceptibility    Staphylococcus epidermidis - MIC*    CIPROFLOXACIN <=0.5 SENSITIVE Sensitive     ERYTHROMYCIN >=8 RESISTANT Resistant     GENTAMICIN <=0.5 SENSITIVE Sensitive     OXACILLIN >=4 RESISTANT Resistant     TETRACYCLINE >=16 RESISTANT Resistant     VANCOMYCIN 1 SENSITIVE Sensitive     TRIMETH/SULFA <=10 SENSITIVE Sensitive     CLINDAMYCIN >=8 RESISTANT Resistant     RIFAMPIN <=0.5 SENSITIVE Sensitive     Inducible Clindamycin NEGATIVE Sensitive     * STAPHYLOCOCCUS EPIDERMIDIS  Blood Culture ID Panel (Reflexed)     Status: Abnormal   Collection Time: 01/24/19  4:15 AM  Result Value Ref Range Status   Enterococcus species NOT DETECTED NOT DETECTED Final   Listeria monocytogenes NOT DETECTED NOT DETECTED Final   Staphylococcus species DETECTED (A) NOT DETECTED Final    Comment: Methicillin (oxacillin) resistant coagulase negative staphylococcus. Possible blood culture contaminant (unless isolated from more than one blood culture draw or clinical case suggests pathogenicity). No antibiotic treatment is indicated for blood  culture contaminants. CRITICAL RESULT CALLED TO, READ BACK BY AND VERIFIED WITH: PHRMD L GREEN @ 9357 01/25/19 BY S GEZAHEGN    Staphylococcus aureus (BCID) NOT DETECTED NOT DETECTED Final   Methicillin resistance DETECTED (A) NOT DETECTED Final    Comment: CRITICAL RESULT CALLED TO, READ BACK BY AND VERIFIED WITH: PHRMD L GREEN @ 0177 01/25/19 BY S GEZAHEGN    Streptococcus species NOT DETECTED NOT DETECTED Final   Streptococcus agalactiae NOT DETECTED NOT DETECTED Final   Streptococcus pneumoniae NOT DETECTED NOT DETECTED Final   Streptococcus pyogenes NOT DETECTED NOT DETECTED Final   Acinetobacter baumannii NOT DETECTED NOT DETECTED Final   Enterobacteriaceae species NOT DETECTED NOT DETECTED Final   Enterobacter cloacae complex NOT DETECTED NOT DETECTED Final   Escherichia coli NOT DETECTED NOT DETECTED Final   Klebsiella oxytoca NOT DETECTED NOT DETECTED Final    Klebsiella pneumoniae NOT DETECTED NOT DETECTED Final   Proteus species NOT DETECTED NOT DETECTED Final   Serratia marcescens NOT DETECTED NOT DETECTED Final   Haemophilus influenzae NOT DETECTED NOT DETECTED Final   Neisseria meningitidis NOT DETECTED NOT DETECTED Final   Pseudomonas aeruginosa NOT DETECTED NOT DETECTED Final   Candida albicans NOT DETECTED NOT DETECTED Final   Candida glabrata NOT DETECTED NOT DETECTED Final   Candida krusei NOT DETECTED NOT DETECTED Final   Candida  parapsilosis NOT DETECTED NOT DETECTED Final   Candida tropicalis NOT DETECTED NOT DETECTED Final    Comment: Performed at [facility], [place] [address], [place], [place] [address]  Culture, blood (routine x 2)     Status: None (Preliminary result)   Collection Time: 01/25/19  8:37 AM   Specimen: BLOOD  Result Value Ref Range Status   Specimen Description   Final    BLOOD RIGHT ARM Performed at [facility] [address], [place], [place] [address]    Special Requests   Final    BOTTLES DRAWN AEROBIC AND ANAEROBIC Blood Culture adequate volume Performed at [facility] [address], [place], [place] [address]    Culture   Final    NO GROWTH 4 DAYS Performed at [facility], [place] [address] [place], [place] [address]    Report Status PENDING  Incomplete  Culture, blood (routine x 2)     Status: None (Preliminary result)   Collection Time: 01/25/19  8:45 AM   Specimen: BLOOD RIGHT HAND  Result Value Ref Range Status   Specimen Description   Final    BLOOD RIGHT HAND Performed at [facility] [address] [place], [place] [address]    Special Requests   Final    BOTTLES DRAWN AEROBIC AND ANAEROBIC Blood Culture adequate volume Performed at [facility] [address], [place], [place] [address]    Culture   Final    NO GROWTH 4 DAYS Performed at [facility], [place] [address], [place], [place]  [address]    Report Status PENDING  Incomplete  MRSA PCR Screening     Status: Abnormal   Collection Time: 01/25/19  6:29 PM   Specimen: Urine, Random; Nasopharyngeal  Result Value Ref Range Status   MRSA by PCR POSITIVE (A) NEGATIVE Final    Comment:        The GeneXpert MRSA Assay (FDA approved for NASAL specimens only), is one component of a comprehensive MRSA colonization surveillance program. It is not intended to diagnose MRSA infection nor to guide or monitor treatment for MRSA infections. RESULT CALLED TO, READ BACK BY AND VERIFIED WITH: J.JALLOH AT 2242 ON 01/25/19 BY N.THOMPSON Performed at [facility], [place] [address], [place], [place] [address]   Urine Culture     Status: Abnormal   Collection Time: 01/25/19  6:30 PM   Specimen: Urine, Random  Result Value Ref Range Status   Specimen Description   Final    URINE, RANDOM Performed at [facility] [address], [place],  [address]    Special Requests   Final    NONE Performed at [facility], [place] [address] [place], [place] [address]    Culture 50,000 COLONIES/mL PSEUDOMONAS AERUGINOSA (A)  Final   Report Status 01/28/2019 FINAL  Final   Organism ID, Bacteria PSEUDOMONAS AERUGINOSA (A)  Final      Susceptibility   Pseudomonas aeruginosa - MIC*    CEFTAZIDIME >=64 RESISTANT Resistant     CIPROFLOXACIN <=0.25 SENSITIVE Sensitive     GENTAMICIN 8 INTERMEDIATE Intermediate     IMIPENEM 2 SENSITIVE Sensitive     CEFEPIME 16 INTERMEDIATE Intermediate     * 50,000 COLONIES/mL PSEUDOMONAS AERUGINOSA    Studies/Results: [place] Ekg Site Rite  Result Date: 01/29/2019 If Site Rite image not attached, placement could not be confirmed due to current cardiac rhythm.  Assessment/Plan: PIC line infection MRSE bacteremia UCx 50k Pseudomonas Crohn's disease Protein Calorie malnutrition (Alb 2.0) Anemia (chronic)  Total days of antibiotics: 5 vanco  Cefepime 10-14 to 10-15  Repeat BCx 10-15 are ngtd x 4 days.  Her TTE is (-) Can replace her PIC.  Would favor 14 days of vanco Explained to pt Available as needed.    Allergies  Allergen Reactions  . Piperacillin-Tazobactam In Dex     Thrombocytopenia   . Metronidazole     neuropathy  . Gabapentin   . Mercaptopurine Nausea Only  . Ciprofloxacin Rash    OPAT Orders Discharge antibiotics: Per pharmacy protocol vancomycin  Aim for Vancomycin trough 15-20 (unless otherwise indicated) Duration: 9 days End Date: 10-28  [facility] Care Per Protocol: please  Labs weekly while on IV antibiotics: _x_ CBC with differential __ BMP _x_ CMP __ CRP __ ESR _x_ Vancomycin trough __ CK  __ Please pull PIC at completion of IV antibiotics _x_ Please leave PIC in place until doctor has seen patient or been notified- to be managed by PCP who manages her TPN  Fax weekly labs to [phone]  Clinic Follow Up Appt: none          Bobby Rumpf MD, FACP Infectious Diseases (pager) [phone] [email or web address] 01/29/2019, 4:43 PM  LOS: 4 days

## 2019-01-29 NOTE — Progress Notes (Signed)
Pharmacy Antibiotic Note  Katrina Boyer is a 68 y.o. female admitted on 01/24/2019 with bacteremia.  Pharmacy has been consulted for Vancomycin dosing.   Plan: Continue Vanc 735m q24 Plan on obtaining vanc peak and trough around tomorrows dose F/u ID's recs for LOT   Height: 5' 4"  (162.6 cm) Weight: 101 lb (45.8 kg) IBW/kg (Calculated) : 54.7  Temp (24hrs), Avg:98.4 F (36.9 C), Min:98.1 F (36.7 C), Max:98.7 F (37.1 C)  Recent Labs  Lab 01/24/19 0253 01/24/19 0411 01/25/19 0836 01/26/19 0531 01/29/19 0506  WBC 6.7  --  4.3 3.4* 4.7  CREATININE 0.97  --  0.97 0.78 0.81  LATICACIDVEN  --  1.0  --   --   --     Estimated Creatinine Clearance: 50.1 mL/min (by C-G formula based on SCr of 0.81 mg/dL).    Allergies  Allergen Reactions  . Piperacillin-Tazobactam In Dex     Thrombocytopenia   . Metronidazole     neuropathy  . Gabapentin   . Mercaptopurine Nausea Only  . Ciprofloxacin Rash    Antimicrobials this admission: 10/14 Vancomycin >>  10/14 Cefepime >> 10/15  Dose adjustments this admission:  Microbiology results: 10/14 bcx x2 (drawn after abx given): 2/2 staph epi (S= cipro, gent, rifamoin, bactrim, vanc; R= clin, eryth, tetra, oxac) FINAL 10/15 ucx:50K PsA (S= cipro,imip; R=ceftaz; I= cefepime, gent)--> MD's note on 10/17 suspects this is contamination 10/15 bcx x2: 10/15 MRSA PCR: positive  Thank you for allowing pharmacy to be a part of this patient's care.  EDolly RiasRPh 01/29/2019, 1:16 PM Pager [phone]

## 2019-01-29 NOTE — Progress Notes (Signed)
Physical Therapy Treatment Patient Details Name: Katrina Boyer MRN: [id number] DOB: 1950/10/09 Today's Date: 01/29/2019    History of Present Illness 68 y o admitted for bacteremia.  PMH;  crohns, recurrent c diff, fibromyalgia. anxiety and depression    PT Comments    Very limited session due to pain.  General bed mobility comments: pt was only able to perform side to side rolling for peri care after having a BM.  Pt MAX c/o back pain with the smallest mvmt.  Required increased time and only able to partially perfom.  Noted decreased rooling ability to LEFT.  Pt crying with pain.  RN stated she "just had something for pain" prior to session.  Pt unable to tolerate any other activity.  Unable to tolerate any attempt for EOB.  Pt stated she came from [place]  and was walking with a walker prior to admit.    Follow Up Recommendations  SNF     Equipment Recommendations  None recommended by PT    Recommendations for Other Services       Precautions / Restrictions Precautions Precautions: Fall Precaution Comments: C Diff Restrictions Weight Bearing Restrictions: No    Mobility  Bed Mobility Overal bed mobility: Needs Assistance Bed Mobility: Rolling Rolling: Mod assist;+2 for physical assistance;+2 for safety/equipment         General bed mobility comments: pt was only able to perform side to side rolling for peri care after having a BM.  Pt MAX c/o back pain with the smallest mvmt.  Required increased time and only able to partially perfom.  Noted decreased rooling ability to LEFT.  Pt crying with pain.  RN stated she "just had something for pain" prior to session.  Pt unable to tolerate any other activity.  Unable to tolerate any attempt for EOB.  Transfers                    Ambulation/Gait                 Stairs             Wheelchair Mobility    Modified Rankin (Stroke Patients Only)       Balance                                             Cognition Arousal/Alertness: Awake/alert Behavior During Therapy: WFL for tasks assessed/performed Overall Cognitive Status: Within Functional Limits for tasks assessed                                 General Comments: AxO x 4      Exercises      General Comments        Pertinent Vitals/Pain Pain Assessment: Faces Pain Score: 10-Worst pain ever Faces Pain Scale: Hurts worst Pain Location: back Pain Descriptors / Indicators: Grimacing;Crying;Stabbing Pain Intervention(s): Monitored during session;Repositioned    Home Living                      Prior Function            PT Goals (current goals can now be found in the care plan section) Progress towards PT goals: Progressing toward goals    Frequency    Min 2X/week  PT Plan      Co-evaluation              AM-PAC PT "6 Clicks" Mobility   Outcome Measure  Help needed turning from your back to your side while in a flat bed without using bedrails?: A Lot Help needed moving from lying on your back to sitting on the side of a flat bed without using bedrails?: Total Help needed moving to and from a bed to a chair (including a wheelchair)?: Total Help needed standing up from a chair using your arms (e.g., wheelchair or bedside chair)?: Total Help needed to walk in hospital room?: Total   6 Click Score: 6    End of Session   Activity Tolerance: Patient limited by pain Patient left: in bed;with call bell/phone within reach Nurse Communication: Patient requests pain meds PT Visit Diagnosis: Muscle weakness (generalized) (M62.81);Pain;Difficulty in walking, not elsewhere classified (R26.2)     Time: 2103-1281 PT Time Calculation (min) (ACUTE ONLY): 13 min  Charges:  $Therapeutic Activity: 8-22 mins                     Rica Koyanagi  PTA Acute  Rehabilitation Services Pager      [phone] Office      [phone]

## 2019-01-30 LAB — CULTURE, BLOOD (ROUTINE X 2)
Culture: NO GROWTH
Culture: NO GROWTH
Special Requests: ADEQUATE
Special Requests: ADEQUATE

## 2019-01-30 LAB — VANCOMYCIN, PEAK: Vancomycin Pk: 46 ug/mL — ABNORMAL HIGH (ref 30–40)

## 2019-01-30 LAB — SARS CORONAVIRUS 2 (TAT 6-24 HRS): SARS Coronavirus 2: NEGATIVE

## 2019-01-30 LAB — GLUCOSE, CAPILLARY: Glucose-Capillary: 103 mg/dL — ABNORMAL HIGH (ref 70–99)

## 2019-01-30 MED ORDER — SODIUM CHLORIDE 0.9% FLUSH: 10.0000 mL | INTRAVENOUS | Status: DC | PRN

## 2019-01-30 MED ORDER — CHLORHEXIDINE GLUCONATE CLOTH 2 % EX PADS
6.0000 | MEDICATED_PAD | Freq: Every day | CUTANEOUS | Status: DC
  Administered 2019-01-30 – 2019-02-01 (×3): 6 via TOPICAL

## 2019-01-30 MED ORDER — SODIUM CHLORIDE 0.9% FLUSH
10.0000 mL | Freq: Two times a day (BID) | INTRAVENOUS | Status: DC
  Administered 2019-01-30 – 2019-01-31 (×3): 10 mL

## 2019-01-30 NOTE — Progress Notes (Signed)
Peripherally Inserted Central Catheter/Midline Placement  The IV Nurse has discussed with the patient and/or persons authorized to consent for the patient, the purpose of this procedure and the potential benefits and risks involved with this procedure.  The benefits include less needle sticks, lab draws from the catheter, and the patient may be discharged home with the catheter. Risks include, but not limited to, infection, bleeding, blood clot (thrombus formation), and puncture of an artery; nerve damage and irregular heartbeat and possibility to perform a PICC exchange if needed/ordered by physician.  Alternatives to this procedure were also discussed.  Bard Power PICC patient education guide, fact sheet on infection prevention and patient information card has been provided to patient /or left at bedside.    PICC/Midline Placement Documentation  PICC Double Lumen 89/84/21 PICC Right Basilic 39 cm 0 cm (Active)  Indication for Insertion or Continuance of Line Home intravenous therapies (PICC only) 01/30/19 1516  Exposed Catheter (cm) 0 cm 01/30/19 1516  Site Assessment Clean;Dry;Intact 01/30/19 1516  Lumen #1 Status Flushed;Blood return noted;Saline locked 01/30/19 1516  Lumen #2 Status Flushed;Blood return noted;Saline locked 01/30/19 1516  Dressing Type Transparent;Securing device 01/30/19 1516  Dressing Status Clean;Dry;Intact;Antimicrobial disc in place 01/30/19 1516  Dressing Change Due 02/06/19 01/30/19 1516       Katrina Boyer 01/30/2019, 3:19 PM

## 2019-01-30 NOTE — Progress Notes (Signed)
While cleaning and changing patients home supplied brief, I noticed significant redness and a rash in her groin area. I explained to patient that the moisture associated skin damage will only get worse with prolonged use of her briefs and that a purewick or bedpan is the better choice. She strongly refused and stated the "purewick" doesn't work and that the bedpan "hurts too much". I replaced the incontinence brief with a dry brief and applied an abundant amount of barrier cream to peri area. Son was also present for this conversation.

## 2019-01-30 NOTE — TOC Initial Note (Signed)
Transition of Care [facility]) - Initial/Assessment Note    Patient Details  Name: Katrina Boyer MRN: [id number] Date of Birth: 11-20-1950  Transition of Care [facility]) CM/SW Contact:    Joaquin Courts, RN Phone Number: 01/30/2019, 10:56 AM  Clinical Narrative:                 CM spoke with Facility rep for [facility]. Facility does not have bed availability to admit patient today, anticipates availability tomorrow. Rep notified that patient will need Iv antibiotics and TPN at discharge. Patient will need negative covid within 3 days of discharge.  Expected Discharge Plan: Skilled Nursing Facility Barriers to Discharge: No SNF bed   Patient Goals and CMS Choice Patient states their goals for this hospitalization and ongoing recovery are:: to get better CMS Medicare.gov Compare Post Acute Care list provided to:: Patient Choice offered to / list presented to : Patient  Expected Discharge Plan and Services Expected Discharge Plan: [place]   Discharge Planning Services: CM Consult Post Acute Care Choice: Nursing Home Living arrangements for the past 2 months: [place]                 DME Arranged: N/A DME Agency: NA       HH Arranged: NA HH Agency: NA        Prior Living Arrangements/Services Living arrangements for the past 2 months: [place] Lives with:: Facility Resident Patient language and need for interpreter reviewed:: Yes Do you feel safe going back to the place where you live?: Yes      Need for Family Participation in Patient Care: No (Comment) Care giver support system in place?: Yes (comment)   Criminal Activity/Legal Involvement Pertinent to Current Situation/Hospitalization: No - Comment as needed  Activities of Daily Living Home Assistive Devices/Equipment: Eyeglasses, CBG Meter, Other (Comment)(tpn supplies) ADL Screening (condition at time of admission) Patient's cognitive ability adequate to safely  complete daily activities?: Yes Is the patient deaf or have difficulty hearing?: No Does the patient have difficulty seeing, even when wearing glasses/contacts?: No Does the patient have difficulty concentrating, remembering, or making decisions?: No Patient able to express need for assistance with ADLs?: Yes Does the patient have difficulty dressing or bathing?: Yes Independently performs ADLs?: No Communication: Independent Dressing (OT): Needs assistance Is this a change from baseline?: Pre-admission baseline Grooming: Needs assistance Is this a change from baseline?: Pre-admission baseline Feeding: Needs assistance Is this a change from baseline?: Pre-admission baseline Bathing: Needs assistance Is this a change from baseline?: Pre-admission baseline Toileting: Dependent Is this a change from baseline?: Pre-admission baseline In/Out Bed: Dependent Is this a change from baseline?: Pre-admission baseline Walks in Home: Dependent Is this a change from baseline?: Pre-admission baseline Does the patient have difficulty walking or climbing stairs?: Yes(secondary to weakness and back pain) Weakness of Legs: Both Weakness of Arms/Hands: Both  Permission Sought/Granted                  Emotional Assessment Appearance:: Appears stated age         Psych Involvement: No (comment)  Admission diagnosis:  Hyponatremia [E87.1] Positive blood cultures [R78.81] Symptomatic anemia [D64.9] Patient Active Problem List   Diagnosis Date Noted  . MRSA bacteremia 01/25/2019  . Bacteremia 01/24/2019  . Crohn's disease ([place]) 01/24/2019   PCP:  Seward Carol, MD Pharmacy:  No Pharmacies Listed    Social Determinants of Health (SDOH) Interventions    Readmission Risk Interventions No flowsheet data found.

## 2019-01-30 NOTE — Progress Notes (Signed)
PROGRESS NOTE    Katrina Boyer  DJT:[id number] DOB: 10-17-1950 DOA: 01/24/2019 PCP: Seward Carol, MD    Brief Narrative:  68 year old female with severe medically refractory Crohn's disease with fistulas, on TPN and ultimate plan for total colectomy, currently at SNF, recent Pseudomonas bacteremia, MRSA pneumonia who presented from skilled nursing facility to the emergency room on request of her GI physician with positive blood cultures collected on 01/23/2019.  Patient is recovering at a SNF with her previous bacteremia, she had a PICC line, she also has C. difficile and on prolonged vancomycin taper, chronic diarrhea, had routine follow-up due to episode of chills last week, blood cultures drawn that were positive for MRSE and was asked to come to the nearest emergency room. In the emergency room, patient was hemodynamically stable.  No leukocytosis.  Admitted for management of bacteremia.  Assessment & Plan:   Principal Problem:   Bacteremia Active Problems:   Crohn's disease ([place])   MRSA bacteremia  MRSE bacteremia due to presence of PICC line. indwelling PICC line with TPN: 4 out of 4 bottles positive for MRSE. Hemodynamically stable. On vancomycin  Repeat blood cultures no growth so far from 01/25/2019. TTE, no vegetation. Appreciate infectious disease input. PICC line today, IV vancomycin until 02/07/2019 as per ID plan.  Chronic recurrent C. difficile colitis: Patient will stay on vancomycin by mouth, will continue vancomycin by mouth and prolonged taper.  She will continue vancomycin and will have outpatient follow-up with her gastroenterologist.  Moderate protein calorie malnutrition: On TPN.  Discontinued due to bacteremia.  PICC line today.  Resume TPN. Will allow regular diet and supplements.  Severe Crohn's disease with fistulas: Follows up at [place] and surgery.  She is getting prepared for surgery, waiting for nutrition to improve.  She will follow-up once  stable.  Anemia of chronic disease: Hemoglobin was 6.9.  Received 1 unit of PRBC with appropriate clinical response.  We will continue to monitor. Iron levels, ferritin and B12 is normal.   Abnormal urinalysis: Growing Pseudomonas.  Probably colonized.    DVT prophylaxis: Lovenox subcu Code Status: Full code Family Communication: None. Disposition Plan: Back to skilled nursing facility with PICC line, IV vancomycin, oral vancomycin and TPN.  Consultants:   Infectious disease  Procedures:   None  Antimicrobials:   Vancomycin, 01/24/2019---  Cefepime, 01/24/2019--- 01/25/2019   Subjective: Patient seen and examined.  No overnight events.  Continues to have diffuse body ache, she has right shoulder pain and right hip area pain that makes difficult to walk.  Had 2 or 3 episodes of diarrhea, that is usual for her.  Objective: Vitals:   01/29/19 0618 01/29/19 1311 01/30/19 0052 01/30/19 0517  BP: (!) 141/71 123/82 132/74 (!) 145/79  Pulse: 82 81 74 75  Resp: 17 18 20 16   Temp: 98.3 F (36.8 C) 98.7 F (37.1 C) 98.5 F (36.9 C) 98.1 F (36.7 C)  TempSrc: Oral Oral Oral Oral  SpO2: 100% 98% 98% 98%  Weight:      Height:        Intake/Output Summary (Last 24 hours) at 01/30/2019 1025 Last data filed at 01/30/2019 0143 Gross per 24 hour  Intake 2927.99 ml  Output --  Net 2927.99 ml   Filed Weights   01/24/19 0239  Weight: 45.8 kg    Examination:  General exam: Appears calm and comfortable, chronically sick looking.  On room air.  Anxious. Respiratory system: Clear to auscultation. Respiratory effort normal.  No added sounds  Cardiovascular system: S1 & S2 heard, RRR. No JVD, murmurs, rubs, gallops or clicks. No pedal edema. Gastrointestinal system: Abdomen is nondistended, soft and nontender. No organomegaly or masses felt. Normal bowel sounds heard. Central nervous system: Alert and oriented. No focal neurological deficits. Extremities: Symmetric 5 x 5 power.   Right hip tenderness without swelling or effusions or deformity. Skin: No rashes, lesions or ulcers Psychiatry: Judgement and insight appear normal. Mood & affect appropriate.     Data Reviewed: I have personally reviewed following labs and imaging studies  CBC: Recent Labs  Lab 01/24/19 0253 01/25/19 0836 01/25/19 1349 01/26/19 0531 01/26/19 1538 01/29/19 0506  WBC 6.7 4.3  --  3.4*  --  4.7  NEUTROABS 4.9  --   --  2.0  --  3.1  HGB 7.6* 7.0*  --  6.9* 9.2* 9.1*  HCT 25.4* 23.7* 23.2* 22.6* 30.7* 30.8*  MCV 92.0 92.9  --  92.6  --  93.1  PLT 234 224  --  199  --  419   Basic Metabolic Panel: Recent Labs  Lab 01/24/19 0253 01/25/19 0836 01/26/19 0531 01/29/19 0506  NA 129* 130* 133* 135  K 4.6 4.5 3.9 3.5  CL 101 107 111 112*  CO2 19* 17* 15* 16*  GLUCOSE 102* 94 94 91  BUN 57* 41* 27* 23  CREATININE 0.97 0.97 0.78 0.81  CALCIUM 9.4 9.4 9.4 9.5  MG  --   --  1.8 1.8  PHOS  --   --  3.9 3.8   GFR: Estimated Creatinine Clearance: 50.1 mL/min (by C-G formula based on SCr of 0.81 mg/dL). Liver Function Tests: Recent Labs  Lab 01/24/19 0253 01/25/19 0836 01/26/19 0531  AST 52* 35 30  ALT 66* 47* 38  ALKPHOS 169* 139* 127*  BILITOT 0.9 0.9 0.3  PROT 7.5 6.8 6.4*  ALBUMIN 2.4* 2.1* 2.0*   No results for input(s): LIPASE, AMYLASE in the last 168 hours. No results for input(s): AMMONIA in the last 168 hours. Coagulation Profile: No results for input(s): INR, PROTIME in the last 168 hours. Cardiac Enzymes: No results for input(s): CKTOTAL, CKMB, CKMBINDEX, TROPONINI in the last 168 hours. BNP (last 3 results) No results for input(s): PROBNP in the last 8760 hours. HbA1C: No results for input(s): HGBA1C in the last 72 hours. CBG: Recent Labs  Lab 01/27/19 1604 01/27/19 2254 01/28/19 0730 01/28/19 1607 01/30/19 0744  GLUCAP 139* 163* 91 132* 103*   Lipid Profile: No results for input(s): CHOL, HDL, LDLCALC, TRIG, CHOLHDL, LDLDIRECT in the last 72  hours. Thyroid Function Tests: No results for input(s): TSH, T4TOTAL, FREET4, T3FREE, THYROIDAB in the last 72 hours. Anemia Panel: No results for input(s): VITAMINB12, FOLATE, FERRITIN, TIBC, IRON, RETICCTPCT in the last 72 hours. Sepsis Labs: Recent Labs  Lab 01/24/19 0411  LATICACIDVEN 1.0    Recent Results (from the past 240 hour(s))  SARS Coronavirus 2 by RT PCR (hospital order, performed in [facility] hospital lab) Nasopharyngeal Nasopharyngeal Swab     Status: None   Collection Time: 01/24/19  4:11 AM   Specimen: Nasopharyngeal Swab  Result Value Ref Range Status   SARS Coronavirus 2 NEGATIVE NEGATIVE Final    Comment: (NOTE) If result is NEGATIVE SARS-CoV-2 target nucleic acids are NOT DETECTED. The SARS-CoV-2 RNA is generally detectable in upper and lower  respiratory specimens during the acute phase of infection. The lowest  concentration of SARS-CoV-2 viral copies this assay can detect is 250  copies / mL.  A negative result does not preclude SARS-CoV-2 infection  and should not be used as the sole basis for treatment or other  patient management decisions.  A negative result may occur with  improper specimen collection / handling, submission of specimen other  than nasopharyngeal swab, presence of viral mutation(s) within the  areas targeted by this assay, and inadequate number of viral copies  (<250 copies / mL). A negative result must be combined with clinical  observations, patient history, and epidemiological information. If result is POSITIVE SARS-CoV-2 target nucleic acids are DETECTED. The SARS-CoV-2 RNA is generally detectable in upper and lower  respiratory specimens dur ing the acute phase of infection.  Positive  results are indicative of active infection with SARS-CoV-2.  Clinical  correlation with patient history and other diagnostic information is  necessary to determine patient infection status.  Positive results do  not rule out bacterial infection  or co-infection with other viruses. If result is PRESUMPTIVE POSTIVE SARS-CoV-2 nucleic acids MAY BE PRESENT.   A presumptive positive result was obtained on the submitted specimen  and confirmed on repeat testing.  While 2019 novel coronavirus  (SARS-CoV-2) nucleic acids may be present in the submitted sample  additional confirmatory testing may be necessary for epidemiological  and / or clinical management purposes  to differentiate between  SARS-CoV-2 and other Sarbecovirus currently known to infect humans.  If clinically indicated additional testing with an alternate test  methodology [phone]) is advised. The SARS-CoV-2 RNA is generally  detectable in upper and lower respiratory sp ecimens during the acute  phase of infection. The expected result is Negative. Fact Sheet for Patients:  StrictlyIdeas.no Fact Sheet for Healthcare Providers: BankingDealers.co.za This test is not yet approved or cleared by the [place] FDA and has been authorized for detection and/or diagnosis of SARS-CoV-2 by FDA under an Emergency Use Authorization (EUA).  This EUA will remain in effect (meaning this test can be used) for the duration of the COVID-19 declaration under Section 564(b)(1) of the Act, 21 U.S.C. section 360bbb-3(b)(1), unless the authorization is terminated or revoked sooner. Performed at [facility], [place] [address], [place], [place] [address]   Blood culture (routine x 2)     Status: Abnormal   Collection Time: 01/24/19  4:12 AM   Specimen: BLOOD  Result Value Ref Range Status   Specimen Description   Final    BLOOD LEFT ANTECUBITAL Performed at [facility] [address], [place], [place] [address]    Special Requests   Final    BOTTLES DRAWN AEROBIC AND ANAEROBIC Blood Culture adequate volume Performed at [facility] [address] [place], [place] [address]     Culture  Setup Time   Final    GRAM POSITIVE COCCI IN BOTH AEROBIC AND ANAEROBIC BOTTLES CRITICAL VALUE NOTED.  VALUE IS CONSISTENT WITH PREVIOUSLY REPORTED AND CALLED VALUE.    Culture (A)  Final    STAPHYLOCOCCUS EPIDERMIDIS SUSCEPTIBILITIES PERFORMED ON PREVIOUS CULTURE WITHIN THE LAST 5 DAYS. Performed at [facility], [place] [address], [place], [place] [address]    Report Status 01/27/2019 FINAL  Final  Blood culture (routine x 2)     Status: Abnormal   Collection Time: 01/24/19  4:15 AM   Specimen: BLOOD  Result Value Ref Range Status   Specimen Description   Final    BLOOD BLOOD RIGHT FOREARM Performed at [facility] [address], [place], [place] [address]  Special Requests   Final    BOTTLES DRAWN AEROBIC AND ANAEROBIC Blood Culture adequate volume Performed at [facility] [address], [place], [place] [address]    Culture  Setup Time   Final    GRAM POSITIVE COCCI IN BOTH AEROBIC AND ANAEROBIC BOTTLES CRITICAL RESULT CALLED TO, READ BACK BY AND VERIFIED WITH: PHRMD L GREEN @ 1443 01/25/19 BY S GEZAHEGN Performed at [facility], [place] [address], [place], [place] [address]    Culture STAPHYLOCOCCUS EPIDERMIDIS (A)  Final   Report Status 01/27/2019 FINAL  Final   Organism ID, Bacteria STAPHYLOCOCCUS EPIDERMIDIS  Final      Susceptibility   Staphylococcus epidermidis - MIC*    CIPROFLOXACIN <=0.5 SENSITIVE Sensitive     ERYTHROMYCIN >=8 RESISTANT Resistant     GENTAMICIN <=0.5 SENSITIVE Sensitive     OXACILLIN >=4 RESISTANT Resistant     TETRACYCLINE >=16 RESISTANT Resistant     VANCOMYCIN 1 SENSITIVE Sensitive     TRIMETH/SULFA <=10 SENSITIVE Sensitive     CLINDAMYCIN >=8 RESISTANT Resistant     RIFAMPIN <=0.5 SENSITIVE Sensitive     Inducible Clindamycin NEGATIVE Sensitive     * STAPHYLOCOCCUS EPIDERMIDIS  Blood Culture ID Panel (Reflexed)     Status: Abnormal   Collection Time: 01/24/19  4:15 AM  Result  Value Ref Range Status   Enterococcus species NOT DETECTED NOT DETECTED Final   Listeria monocytogenes NOT DETECTED NOT DETECTED Final   Staphylococcus species DETECTED (A) NOT DETECTED Final    Comment: Methicillin (oxacillin) resistant coagulase negative staphylococcus. Possible blood culture contaminant (unless isolated from more than one blood culture draw or clinical case suggests pathogenicity). No antibiotic treatment is indicated for blood  culture contaminants. CRITICAL RESULT CALLED TO, READ BACK BY AND VERIFIED WITH: PHRMD L GREEN @ 8676 01/25/19 BY S GEZAHEGN    Staphylococcus aureus (BCID) NOT DETECTED NOT DETECTED Final   Methicillin resistance DETECTED (A) NOT DETECTED Final    Comment: CRITICAL RESULT CALLED TO, READ BACK BY AND VERIFIED WITH: PHRMD L GREEN @ 1950 01/25/19 BY S GEZAHEGN    Streptococcus species NOT DETECTED NOT DETECTED Final   Streptococcus agalactiae NOT DETECTED NOT DETECTED Final   Streptococcus pneumoniae NOT DETECTED NOT DETECTED Final   Streptococcus pyogenes NOT DETECTED NOT DETECTED Final   Acinetobacter baumannii NOT DETECTED NOT DETECTED Final   Enterobacteriaceae species NOT DETECTED NOT DETECTED Final   Enterobacter cloacae complex NOT DETECTED NOT DETECTED Final   Escherichia coli NOT DETECTED NOT DETECTED Final   Klebsiella oxytoca NOT DETECTED NOT DETECTED Final   Klebsiella pneumoniae NOT DETECTED NOT DETECTED Final   Proteus species NOT DETECTED NOT DETECTED Final   Serratia marcescens NOT DETECTED NOT DETECTED Final   Haemophilus influenzae NOT DETECTED NOT DETECTED Final   Neisseria meningitidis NOT DETECTED NOT DETECTED Final   Pseudomonas aeruginosa NOT DETECTED NOT DETECTED Final   Candida albicans NOT DETECTED NOT DETECTED Final   Candida glabrata NOT DETECTED NOT DETECTED Final   Candida krusei NOT DETECTED NOT DETECTED Final   Candida parapsilosis NOT DETECTED NOT DETECTED Final   Candida tropicalis NOT DETECTED NOT DETECTED  Final    Comment: Performed at [facility], [place] [address] [place], [place] [address]  Culture, blood (routine x 2)     Status: None   Collection Time: 01/25/19  8:37 AM   Specimen: BLOOD  Result Value Ref Range Status   Specimen Description   Final  BLOOD RIGHT ARM Performed at [facility], [place] [address], [place], [place] [address]    Special Requests   Final    BOTTLES DRAWN AEROBIC AND ANAEROBIC Blood Culture adequate volume Performed at [facility] [address], [place], [place] [address]    Culture   Final    NO GROWTH 5 DAYS Performed at [facility], [place] [address], [place], [place] [address]    Report Status 01/30/2019 FINAL  Final  Culture, blood (routine x 2)     Status: None   Collection Time: 01/25/19  8:45 AM   Specimen: BLOOD RIGHT HAND  Result Value Ref Range Status   Specimen Description   Final    BLOOD RIGHT HAND Performed at [facility] [address], [place], [place] [address]    Special Requests   Final    BOTTLES DRAWN AEROBIC AND ANAEROBIC Blood Culture adequate volume Performed at [facility] [address], [place], [place] [address]    Culture   Final    NO GROWTH 5 DAYS Performed at [facility], [place] [address], [place], [place] [address]    Report Status 01/30/2019 FINAL  Final  MRSA PCR Screening     Status: Abnormal   Collection Time: 01/25/19  6:29 PM   Specimen: Urine, Random; Nasopharyngeal  Result Value Ref Range Status   MRSA by PCR POSITIVE (A) NEGATIVE Final    Comment:        The GeneXpert MRSA Assay (FDA approved for NASAL specimens only), is one component of a comprehensive MRSA colonization surveillance program. It is not intended to diagnose MRSA infection nor to guide or monitor treatment for MRSA infections. RESULT CALLED TO, READ BACK BY AND VERIFIED WITH: J.JALLOH AT 2242 ON 01/25/19 BY N.THOMPSON Performed at  [facility], [place] [address], [place], [place] [address]   Urine Culture     Status: Abnormal   Collection Time: 01/25/19  6:30 PM   Specimen: Urine, Random  Result Value Ref Range Status   Specimen Description   Final    URINE, RANDOM Performed at [facility] [address] [place], [place] [address]    Special Requests   Final    NONE Performed at [facility], [place] [address] [place], [place] [address]    Culture 50,000 COLONIES/mL PSEUDOMONAS AERUGINOSA (A)  Final   Report Status 01/28/2019 FINAL  Final   Organism ID, Bacteria PSEUDOMONAS AERUGINOSA (A)  Final      Susceptibility   Pseudomonas aeruginosa - MIC*    CEFTAZIDIME >=64 RESISTANT Resistant     CIPROFLOXACIN <=0.25 SENSITIVE Sensitive     GENTAMICIN 8 INTERMEDIATE Intermediate     IMIPENEM 2 SENSITIVE Sensitive     CEFEPIME 16 INTERMEDIATE Intermediate     * 50,000 COLONIES/mL PSEUDOMONAS AERUGINOSA         Radiology Studies: [place] Ekg Site Rite  Result Date: 01/29/2019 If Site Rite image not attached, placement could not be confirmed due to current cardiac rhythm.       Scheduled Meds:  buPROPion  300 mg Oral Daily   enoxaparin (LOVENOX) injection  30 mg Subcutaneous Q24H   escitalopram  5 mg Oral Daily   feeding supplement  1 Container Oral TID BM   feeding supplement (PRO-STAT SUGAR FREE 64)  30 mL Oral TID   lidocaine  1 patch Transdermal Q24H   Melatonin  3  mg Oral QHS   multivitamin with minerals  1 tablet Oral Daily   pantoprazole  40 mg Oral Daily   pravastatin  20 mg Oral Daily   saccharomyces boulardii  250 mg Oral BID   vancomycin  125 mg Oral QID   Continuous Infusions:  sodium chloride 50 mL/hr at 01/30/19 0050   vancomycin 750 mg (01/30/19 0702)     LOS: 5 days    Time spent: 25 minutes    Barb Merino, MD [facility]

## 2019-01-30 NOTE — TOC Progression Note (Signed)
Transition of Care [facility]) - Progression Note    Patient Details  Name: Katrina Boyer MRN: [id number] Date of Birth: August 08, 1950  Transition of Care [facility]) CM/SW Contact  Joaquin Courts, RN Phone Number: 01/30/2019, 4:29 PM  Clinical Narrative:    Received call from [facility] rep stating they have had a Covid outbreak in the facility and are uncertain if they will be able to take any admissions, state awaiting guidance.  Complex disposition for this patient at she requires TPN as well as IV ABX.  Patient is aware of this situation and states she is ok with CM attempting to find another facility to provide these services, however she knows from previous experience that her options are limited. Spoke with Ethelda Chick with [facility] who provides TPN to facilities, two potential options are [facility] and [facility].  CM spoke with [facility] rep who states they do not do TPN. Voicemail left for [facility] rep to see if any beds available, awaiting call back.     Expected Discharge Plan: [place] Barriers to Discharge: No SNF bed  Expected Discharge Plan and Services Expected Discharge Plan: [place]   Discharge Planning Services: CM Consult Post Acute Care Choice: Nursing Home Living arrangements for the past 2 months: [place]                 DME Arranged: N/A DME Agency: NA       HH Arranged: NA HH Agency: NA         Social Determinants of Health (SDOH) Interventions    Readmission Risk Interventions No flowsheet data found.

## 2019-01-31 LAB — CBC WITH DIFFERENTIAL/PLATELET
Abs Immature Granulocytes: 0.04 10*3/uL (ref 0.00–0.07)
Basophils Absolute: 0 10*3/uL (ref 0.0–0.1)
Basophils Relative: 1 %
Eosinophils Absolute: 0 10*3/uL (ref 0.0–0.5)
Eosinophils Relative: 0 %
HCT: 27.5 % — ABNORMAL LOW (ref 36.0–46.0)
Hemoglobin: 8.4 g/dL — ABNORMAL LOW (ref 12.0–15.0)
Immature Granulocytes: 1 %
Lymphocytes Relative: 31 %
Lymphs Abs: 1.3 10*3/uL (ref 0.7–4.0)
MCH: 27.7 pg (ref 26.0–34.0)
MCHC: 30.5 g/dL (ref 30.0–36.0)
MCV: 90.8 fL (ref 80.0–100.0)
Monocytes Absolute: 0.3 10*3/uL (ref 0.1–1.0)
Monocytes Relative: 7 %
Neutro Abs: 2.5 10*3/uL (ref 1.7–7.7)
Neutrophils Relative %: 60 %
Platelets: 254 10*3/uL (ref 150–400)
RBC: 3.03 MIL/uL — ABNORMAL LOW (ref 3.87–5.11)
RDW: 16.7 % — ABNORMAL HIGH (ref 11.5–15.5)
WBC: 4.2 10*3/uL (ref 4.0–10.5)
nRBC: 0 % (ref 0.0–0.2)

## 2019-01-31 LAB — GLUCOSE, CAPILLARY: Glucose-Capillary: 83 mg/dL (ref 70–99)

## 2019-01-31 LAB — BASIC METABOLIC PANEL
Anion gap: 6 (ref 5–15)
BUN: 19 mg/dL (ref 8–23)
CO2: 19 mmol/L — ABNORMAL LOW (ref 22–32)
Calcium: 9.6 mg/dL (ref 8.9–10.3)
Chloride: 108 mmol/L (ref 98–111)
Creatinine, Ser: 0.75 mg/dL (ref 0.44–1.00)
GFR calc Af Amer: >60 mL/min (ref >60)
GFR calc non Af Amer: >60 mL/min (ref >60)
Glucose, Bld: 92 mg/dL (ref 70–99)
Potassium: 3.4 mmol/L — ABNORMAL LOW (ref 3.5–5.1)
Sodium: 133 mmol/L — ABNORMAL LOW (ref 135–145)

## 2019-01-31 LAB — PHOSPHORUS: Phosphorus: 4.1 mg/dL (ref 2.5–4.6)

## 2019-01-31 LAB — VANCOMYCIN, TROUGH: Vancomycin Tr: 17 ug/mL (ref 15–20)

## 2019-01-31 LAB — MAGNESIUM: Magnesium: 1.9 mg/dL (ref 1.7–2.4)

## 2019-01-31 MED ORDER — VANCOMYCIN HCL 500 MG IV SOLR
500.0000 mg | INTRAVENOUS | Status: DC
  Administered 2019-02-01: 500 mg via INTRAVENOUS
  Filled 2019-01-31 (×2): qty 500

## 2019-01-31 MED ORDER — POTASSIUM CHLORIDE CRYS ER 20 MEQ PO TBCR
40.0000 meq | EXTENDED_RELEASE_TABLET | Freq: Once | ORAL | Status: AC
  Administered 2019-01-31: 40 meq via ORAL
  Filled 2019-01-31: qty 2

## 2019-01-31 MED ORDER — ENOXAPARIN SODIUM 40 MG/0.4ML ~~LOC~~ SOLN
40.0000 mg | SUBCUTANEOUS | Status: DC
  Administered 2019-01-31: 22:00:00 40 mg via SUBCUTANEOUS
  Filled 2019-01-31: qty 0.4

## 2019-01-31 MED ORDER — PRO-STAT SUGAR FREE PO LIQD
30.0000 mL | Freq: Every day | ORAL | Status: DC
  Administered 2019-01-31 – 2019-02-01 (×2): 30 mL via ORAL
  Filled 2019-01-31 (×2): qty 30

## 2019-01-31 NOTE — Progress Notes (Signed)
PHARMACY CONSULT NOTE FOR:  OUTPATIENT  PARENTERAL ANTIBIOTIC THERAPY (OPAT)  Indication: MRSA bacteremia Regimen: vancomycin 543m IV q24h End date: 02/07/2019  IV antibiotic discharge orders are pended. To discharging provider:  please sign these orders via discharge navigator,  Select New Orders & click on the button choice - Manage This Unsigned Work.     Thank you for allowing pharmacy to be a part of this patient's care.  EDolly RiasRPh 01/31/2019, 8:03 AM Pager [phone]

## 2019-01-31 NOTE — Progress Notes (Signed)
Nutrition Follow-up  RD working remotely.   DOCUMENTATION CODES:   Underweight  INTERVENTION:  - continue Boost Breeze TID. - will decrease prostat from TID to once/day. - TPN initiation and advancement per Pharmacist.    NUTRITION DIAGNOSIS:   Increased nutrient needs related to acute illness as evidenced by estimated needs. -revised  GOAL:   Patient will meet greater than or equal to 90% of their needs -unmet due to CLD  MONITOR:   PO intake, Supplement acceptance, Labs, Weight trends, Other (Comment)(TPN regimen)  REASON FOR ASSESSMENT:   Consult New TPN/TNA  ASSESSMENT:   68 year old female with medical history of anxiety, recurrent C.diff, and severe refractory Crohn's disease with fistulas. She has been on TPN and resides at [facility]. Ultimate plan is for total colectomy pending nutrition optimization. She was recent diagnosed with pseudomonas bacteremia and MRSA PNA. She presented to the ED from SNF on 10/14 at the request of outpatient GI MD d/t positive blood cultures for MRSA collected on 10/13. Patient has PICC and is currently positive for C.diff and is on prolonged vancomycin taper. She was admitted for management of bacteremia.  Weight this AM +1.8 kg/4 lb compared to admission (10/14) weight. Diet advanced from CLD to Regular on 10/16 at 1414 and, per flow sheet documentation, she consumed 60% of breakfast, 20% of lunch, 90% of dinner on 10/17; 80% of breakfast, 100% of lunch and dinner on 10/18; 100% of breakfast on 10/19. Per review of orders, she has accepted all cartons of Boost Breeze and has accepted prostat ~50% of the time offered.   Double lumen PICC placed in R basilic yesterday at ~6294. TPN has not yet been started. Patient has been on TPN at SNF (in addition to CLD) to optimize nutrition prior to anticipated total colectomy at [facility].  Per notes: - MRSE bacteremia d/t PICC line infection--old PICC removed at time of admission and replaced yesterday  (10/20) - chronic, recurrent c.diff colitis--on vancomycin and plan for outpatient GI follow-up - moderate malnutrition - severe Crohn's disease with fistulas - anemia of chronic disease--s/p 1 unit PRBCs - plan when ready for d/c is to return to SNF    Labs reviewed; Na: 133 mmol/l, K: 3.4 mmol/l. Medications reviewed; PRN imodium, 3 mg melatonin/night, daily multivitamin with minerals, 40 mg oral protonix/day, 250 mg florastor BID.     NUTRITION - FOCUSED PHYSICAL EXAM:  unable to complete at this time.   Diet Order:   Diet Order            Diet regular Room service appropriate? Yes; Fluid consistency: Thin  Diet effective now              EDUCATION NEEDS:   Not appropriate for education at this time  Skin:  Skin Assessment: Reviewed RN Assessment  Last BM:  10/20  Height:   Ht Readings from Last 1 Encounters:  01/24/19 5' 4"  (1.626 m)    Weight:   Wt Readings from Last 1 Encounters:  01/31/19 47.6 kg    Ideal Body Weight:  54.5 kg  BMI:  Body mass index is 18.01 kg/m.  Estimated Nutritional Needs:   Kcal:  1605-1840 kcal  Protein:  80-95 grams  Fluid:  >/= 2.2 L/day      Jarome Matin, MS, RD, LDN, [facility] Inpatient Clinical Dietitian Pager # [phone] After hours/weekend pager # [phone]

## 2019-01-31 NOTE — Progress Notes (Signed)
Occupational Therapy Treatment Patient Details Name: Katrina Boyer MRN: [id number] DOB: Dec 23, 1950 Today's Date: 01/31/2019    History of present illness 3 y o admitted for bacteremia.  PMH;  crohns, recurrent c diff, fibromyalgia. anxiety and depression   OT comments  Pt progressing slowly towards acute OT goals. Remains limited by pain. Bed level mobility and setting up with BUE strengthening program to toleration was focus of this session. D/c plan remains appropriate.    Follow Up Recommendations  SNF    Equipment Recommendations  None recommended by OT    Recommendations for Other Services      Precautions / Restrictions Precautions Precautions: Fall Precaution Comments: C Diff Restrictions Weight Bearing Restrictions: No       Mobility Bed Mobility Overal bed mobility: Needs Assistance Bed Mobility: Rolling Rolling: Min assist         General bed mobility comments: more painful to roll to right. steadying assist. Practice rolling each direction and stayed in sidelying each side for a few minutes. Returned to supine.  Transfers                      Balance                                           ADL either performed or assessed with clinical judgement   ADL Overall ADL's : Needs assistance/impaired                                       General ADL Comments: bed level only: pt unable to tolerate sitting up     Vision       Perception     Praxis      Cognition Arousal/Alertness: Awake/alert Behavior During Therapy: WFL for tasks assessed/performed Overall Cognitive Status: Within Functional Limits for tasks assessed                                 General Comments: AxO x 4        Exercises Exercises: Other exercises Other Exercises Other Exercises: set up level 1 theraband on both side bed rails and went through elbow flexion/extension exercises. Pt reporting some increased pain  but determined to continue. Educated on finding the just right challenge for these exercises and alternative if theraband is causing pain (AROM with no resistance)   Shoulder Instructions       General Comments      Pertinent Vitals/ Pain       Pain Assessment: Faces Faces Pain Scale: Hurts whole lot Pain Location: back Pain Descriptors / Indicators: Grimacing;Guarding Pain Intervention(s): Limited activity within patient's tolerance;Monitored during session;Premedicated before session  Home Living                                          Prior Functioning/Environment              Frequency  Min 2X/week        Progress Toward Goals  OT Goals(current goals can now be found in the care plan section)  Progress towards OT goals: Progressing toward goals(continues to be  limited by pain)  Acute Rehab OT Goals Patient Stated Goal: less pain OT Goal Formulation: With patient Time For Goal Achievement: 02/08/19 Potential to Achieve Goals: Fair ADL Goals Additional ADL Goal #1: pt will roll to bil sides with min A for adls Additional ADL Goal #2: pt will tolerate 5 reps x 2 sets shoulder AROM, and be independent with level one theraband for bil elbows Additional ADL Goal #3: pt will tolerate sitting EOB x 3 minutes with min A once assisted to sitting in preparation for seated adls  Plan Discharge plan remains appropriate    Co-evaluation                 AM-PAC OT "6 Clicks" Daily Activity     Outcome Measure   Help from another person eating meals?: A Little Help from another person taking care of personal grooming?: A Lot Help from another person toileting, which includes using toliet, bedpan, or urinal?: Total Help from another person bathing (including washing, rinsing, drying)?: Total Help from another person to put on and taking off regular upper body clothing?: Total Help from another person to put on and taking off regular lower body  clothing?: Total 6 Click Score: 9    End of Session    OT Visit Diagnosis: Pain;Muscle weakness (generalized) (M62.81);Feeding difficulties (R63.3)   Activity Tolerance Patient limited by pain   Patient Left in bed;with call bell/phone within reach   Nurse Communication          Time: 1217-1242 OT Time Calculation (min): 25 min  Charges: OT General Charges $OT Visit: 1 Visit OT Treatments $Self Care/Home Management : 8-22 mins $Therapeutic Exercise: 8-22 mins  Tyrone Schimke, OT Acute Rehabilitation Services Pager: [phone] Office: [phone]    Hortencia Pilar 01/31/2019, 2:13 PM

## 2019-01-31 NOTE — Progress Notes (Signed)
PROGRESS NOTE    Katrina Boyer  NKN:[id number] DOB: 12-18-1950 DOA: 01/24/2019 PCP: Seward Carol, MD    Brief Narrative:   Katrina Boyer is a 68 year old female with severe medically refractory Crohn's disease with fistulas, on TPN and ultimate plan for total colectomy, currently at SNF, recent Pseudomonas bacteremia, MRSA pneumonia who presented from skilled nursing facility to the emergency room on request of her GI physician with positive blood cultures collected on 01/23/2019.  Patient is recovering at a SNF with her previous bacteremia, she had a PICC line, she also has C. difficile and on prolonged vancomycin taper, chronic diarrhea, had routine follow-up due to episode of chills last week, blood cultures drawn that were positive for MRSE and was asked to come to the nearest emergency room. In the emergency room, patient was hemodynamically stable.  No leukocytosis.  Admitted for management of bacteremia.   Assessment & Plan:   Principal Problem:   Bacteremia Active Problems:   Crohn's disease ([place])   MRSA bacteremia   Methicillin-resistant Staphylococcus epidermidis septicemia due to presence of PICC line, present on admission Patient presenting to ED following outpatient follow-up with history of chills and positive blood cultures for MRSE.  Infectious disease was consulted and followed during hospital course.  TTE was performed without any signs of obvious vegetation.  Repeat blood cultures 01/25/2019 showed no growth x5 days.  PICC line replaced on 01/30/2019. --Vanc trough 17 --Continue IV vancomycin 554m IV q24h with planned stop date 02/07/2019 per ID --Continue to follow renal function --will need weekly CBC, CMP, Vanco level after discharge  Chronic recurrent C. difficile colitis:  Given her history of significant C. difficile colitis and need for IV vancomycin as above, will continue oral vancomycin and recommend prolonged taper.  Will need outpatient follow-up with her  primary gastroenterologist.  [facility]  Moderate protein calorie malnutrition:  Was initially on TPN for increased caloric needs in preparation for colon surgery.  Initially was discontinued due to bacteremia.  PICC line was replaced on 01/30/2019. --Resumed TPN, regular diet and supplements  Severe Crohn's disease with fistulas:  [facility]  She is getting prepared for surgery, waiting for nutrition to improve. Follow-up once stable.  Anemia of chronic disease:  Hemoglobin was 6.9.  Received 1 unit of PRBC with appropriate clinical response.  We will continue to monitor. Iron levels, ferritin and B12 is normal.   Abnormal urinalysis: Growing Pseudomonas.  Probably colonized.   Hypokalemia --Potassium 3.4 today, will replete   DVT prophylaxis: Lovenox Code Status: Full code Family Communication: None present at bedside Disposition Plan: Plan discharge to SNF with PICC line for further IV vancomycin, oral vancomycin and TPN.   Consultants:   Infectious disease  Procedures:   PICC line 01/30/2019  Antimicrobials:   Vancomycin, 01/24/2019>> End 02/07/2019 per ID  Cefepime 01/24/2019 -01/25/2019   Subjective: Patient seen and examined at bedside, resting comfortably.  Awaiting disposition to SNF.  No complaints this morning.  Denies headache, no fever/chills/night sweats, no chest pain, palpitations, no nausea cefonicid diarrhea, no shortness of breath, no abdominal pain.  No acute events overnight per nursing staff.  Objective: Vitals:   01/30/19 1730 01/30/19 2144 01/31/19 0500 01/31/19 0702  BP: (!) 148/72 (!) 147/75  (!) 144/77  Pulse: 74 73  69  Resp: 18 16  17   Temp: 97.9 F (36.6 C) 99.1 F (37.3 C)  98.1  F (36.7 C)  TempSrc: Oral Oral  Oral  SpO2: 100% 98%  100%  Weight:   47.6 kg   Height:        Intake/Output Summary (Last 24 hours) at 01/31/2019 1306  Last data filed at 01/31/2019 0800 Gross per 24 hour  Intake 300 ml  Output -  Net 300 ml   Filed Weights   01/24/19 0239 01/31/19 0500  Weight: 45.8 kg 47.6 kg    Examination:  General exam: Appears calm and comfortable, chronically ill in appearance Respiratory system: Clear to auscultation. Respiratory effort normal. Cardiovascular system: S1 & S2 heard, RRR. No JVD, murmurs, rubs, gallops or clicks. No pedal edema. Gastrointestinal system: Abdomen is nondistended, soft and nontender. No organomegaly or masses felt. Normal bowel sounds heard. Central nervous system: Alert and oriented. No focal neurological deficits. Extremities: Symmetric 5 x 5 power. Skin: No rashes, lesions or ulcers Psychiatry: Judgement and insight appear normal. Mood & affect appropriate.     Data Reviewed: I have personally reviewed following labs and imaging studies  CBC: Recent Labs  Lab 01/25/19 0836 01/25/19 1349 01/26/19 0531 01/26/19 1538 01/29/19 0506 01/31/19 0555  WBC 4.3  --  3.4*  --  4.7 4.2  NEUTROABS  --   --  2.0  --  3.1 2.5  HGB 7.0*  --  6.9* 9.2* 9.1* 8.4*  HCT 23.7* 23.2* 22.6* 30.7* 30.8* 27.5*  MCV 92.9  --  92.6  --  93.1 90.8  PLT 224  --  199  --  235 706   Basic Metabolic Panel: Recent Labs  Lab 01/25/19 0836 01/26/19 0531 01/29/19 0506 01/31/19 0555  NA 130* 133* 135 133*  K 4.5 3.9 3.5 3.4*  CL 107 111 112* 108  CO2 17* 15* 16* 19*  GLUCOSE 94 94 91 92  BUN 41* 27* 23 19  CREATININE 0.97 0.78 0.81 0.75  CALCIUM 9.4 9.4 9.5 9.6  MG  --  1.8 1.8 1.9  PHOS  --  3.9 3.8 4.1   GFR: Estimated Creatinine Clearance: 52.7 mL/min (by C-G formula based on SCr of 0.75 mg/dL). Liver Function Tests: Recent Labs  Lab 01/25/19 0836 01/26/19 0531  AST 35 30  ALT 47* 38  ALKPHOS 139* 127*  BILITOT 0.9 0.3  PROT 6.8 6.4*  ALBUMIN 2.1* 2.0*   No results for input(s): LIPASE, AMYLASE in the last 168 hours. No results for input(s): AMMONIA in the last 168  hours. Coagulation Profile: No results for input(s): INR, PROTIME in the last 168 hours. Cardiac Enzymes: No results for input(s): CKTOTAL, CKMB, CKMBINDEX, TROPONINI in the last 168 hours. BNP (last 3 results) No results for input(s): PROBNP in the last 8760 hours. HbA1C: No results for input(s): HGBA1C in the last 72 hours. CBG: Recent Labs  Lab 01/27/19 2254 01/28/19 0730 01/28/19 1607 01/30/19 0744 01/31/19 0740  GLUCAP 163* 91 132* 103* 83   Lipid Profile: No results for input(s): CHOL, HDL, LDLCALC, TRIG, CHOLHDL, LDLDIRECT in the last 72 hours. Thyroid Function Tests: No results for input(s): TSH, T4TOTAL, FREET4, T3FREE, THYROIDAB in the last 72 hours. Anemia Panel: No results for input(s): VITAMINB12, FOLATE, FERRITIN, TIBC, IRON, RETICCTPCT in the last 72 hours. Sepsis Labs: No results for input(s): PROCALCITON, LATICACIDVEN in the last 168 hours.  Recent Results (from the past 240 hour(s))  SARS Coronavirus 2 by RT PCR (hospital order, performed in [facility] hospital lab) Nasopharyngeal Nasopharyngeal Swab     Status: None   Collection  Time: 01/24/19  4:11 AM   Specimen: Nasopharyngeal Swab  Result Value Ref Range Status   SARS Coronavirus 2 NEGATIVE NEGATIVE Final    Comment: (NOTE) If result is NEGATIVE SARS-CoV-2 target nucleic acids are NOT DETECTED. The SARS-CoV-2 RNA is generally detectable in upper and lower  respiratory specimens during the acute phase of infection. The lowest  concentration of SARS-CoV-2 viral copies this assay can detect is 250  copies / mL. A negative result does not preclude SARS-CoV-2 infection  and should not be used as the sole basis for treatment or other  patient management decisions.  A negative result may occur with  improper specimen collection / handling, submission of specimen other  than nasopharyngeal swab, presence of viral mutation(s) within the  areas targeted by this assay, and inadequate number of viral copies   (<250 copies / mL). A negative result must be combined with clinical  observations, patient history, and epidemiological information. If result is POSITIVE SARS-CoV-2 target nucleic acids are DETECTED. The SARS-CoV-2 RNA is generally detectable in upper and lower  respiratory specimens dur ing the acute phase of infection.  Positive  results are indicative of active infection with SARS-CoV-2.  Clinical  correlation with patient history and other diagnostic information is  necessary to determine patient infection status.  Positive results do  not rule out bacterial infection or co-infection with other viruses. If result is PRESUMPTIVE POSTIVE SARS-CoV-2 nucleic acids MAY BE PRESENT.   A presumptive positive result was obtained on the submitted specimen  and confirmed on repeat testing.  While 2019 novel coronavirus  (SARS-CoV-2) nucleic acids may be present in the submitted sample  additional confirmatory testing may be necessary for epidemiological  and / or clinical management purposes  to differentiate between  SARS-CoV-2 and other Sarbecovirus currently known to infect humans.  If clinically indicated additional testing with an alternate test  methodology [phone]) is advised. The SARS-CoV-2 RNA is generally  detectable in upper and lower respiratory sp ecimens during the acute  phase of infection. The expected result is Negative. Fact Sheet for Patients:  StrictlyIdeas.no Fact Sheet for Healthcare Providers: BankingDealers.co.za This test is not yet approved or cleared by the [place] FDA and has been authorized for detection and/or diagnosis of SARS-CoV-2 by FDA under an Emergency Use Authorization (EUA).  This EUA will remain in effect (meaning this test can be used) for the duration of the COVID-19 declaration under Section 564(b)(1) of the Act, 21 U.S.C. section 360bbb-3(b)(1), unless the authorization is terminated or  revoked sooner. Performed at [facility], [place] [address], [place], [place] [address]   Blood culture (routine x 2)     Status: Abnormal   Collection Time: 01/24/19  4:12 AM   Specimen: BLOOD  Result Value Ref Range Status   Specimen Description   Final    BLOOD LEFT ANTECUBITAL Performed at [facility] [address], [place], [place] [address]    Special Requests   Final    BOTTLES DRAWN AEROBIC AND ANAEROBIC Blood Culture adequate volume Performed at [facility] [address] [place],  [address]    Culture  Setup Time   Final    GRAM POSITIVE COCCI IN BOTH AEROBIC AND ANAEROBIC BOTTLES CRITICAL VALUE NOTED.  VALUE IS CONSISTENT WITH PREVIOUSLY REPORTED AND CALLED VALUE.    Culture (A)  Final    STAPHYLOCOCCUS EPIDERMIDIS SUSCEPTIBILITIES PERFORMED ON PREVIOUS CULTURE WITHIN THE LAST 5 DAYS. Performed at [facility], [place] [address], [place], [place] [address]    Report Status 01/27/2019 FINAL  Final  Blood culture (routine x 2)     Status: Abnormal   Collection Time: 01/24/19  4:15 AM   Specimen: BLOOD  Result Value Ref Range Status   Specimen Description   Final    BLOOD BLOOD RIGHT FOREARM Performed at [facility] [address], [place], [place] [address]    Special Requests   Final    BOTTLES DRAWN AEROBIC AND ANAEROBIC Blood Culture adequate volume Performed at [facility] [address], [place], [place] [address]    Culture  Setup Time   Final    GRAM POSITIVE COCCI IN BOTH AEROBIC AND ANAEROBIC BOTTLES CRITICAL RESULT CALLED TO, READ BACK BY AND VERIFIED WITH: PHRMD L GREEN @ 9163 01/25/19 BY S GEZAHEGN Performed at [facility], [place] [address] [place], [place] [address]    Culture STAPHYLOCOCCUS EPIDERMIDIS (A)  Final   Report Status 01/27/2019 FINAL  Final   Organism ID, Bacteria STAPHYLOCOCCUS EPIDERMIDIS  Final      Susceptibility    Staphylococcus epidermidis - MIC*    CIPROFLOXACIN <=0.5 SENSITIVE Sensitive     ERYTHROMYCIN >=8 RESISTANT Resistant     GENTAMICIN <=0.5 SENSITIVE Sensitive     OXACILLIN >=4 RESISTANT Resistant     TETRACYCLINE >=16 RESISTANT Resistant     VANCOMYCIN 1 SENSITIVE Sensitive     TRIMETH/SULFA <=10 SENSITIVE Sensitive     CLINDAMYCIN >=8 RESISTANT Resistant     RIFAMPIN <=0.5 SENSITIVE Sensitive     Inducible Clindamycin NEGATIVE Sensitive     * STAPHYLOCOCCUS EPIDERMIDIS  Blood Culture ID Panel (Reflexed)     Status: Abnormal   Collection Time: 01/24/19  4:15 AM  Result Value Ref Range Status   Enterococcus species NOT DETECTED NOT DETECTED Final   Listeria monocytogenes NOT DETECTED NOT DETECTED Final   Staphylococcus species DETECTED (A) NOT DETECTED Final    Comment: Methicillin (oxacillin) resistant coagulase negative staphylococcus. Possible blood culture contaminant (unless isolated from more than one blood culture draw or clinical case suggests pathogenicity). No antibiotic treatment is indicated for blood  culture contaminants. CRITICAL RESULT CALLED TO, READ BACK BY AND VERIFIED WITH: PHRMD L GREEN @ 9935 01/25/19 BY S GEZAHEGN    Staphylococcus aureus (BCID) NOT DETECTED NOT DETECTED Final   Methicillin resistance DETECTED (A) NOT DETECTED Final    Comment: CRITICAL RESULT CALLED TO, READ BACK BY AND VERIFIED WITH: PHRMD L GREEN @ 7017 01/25/19 BY S GEZAHEGN    Streptococcus species NOT DETECTED NOT DETECTED Final   Streptococcus agalactiae NOT DETECTED NOT DETECTED Final   Streptococcus pneumoniae NOT DETECTED NOT DETECTED Final   Streptococcus pyogenes NOT DETECTED NOT DETECTED Final   Acinetobacter baumannii NOT DETECTED NOT DETECTED Final   Enterobacteriaceae species NOT DETECTED NOT DETECTED Final   Enterobacter cloacae complex NOT DETECTED NOT DETECTED Final   Escherichia coli NOT DETECTED NOT DETECTED Final   Klebsiella oxytoca NOT DETECTED NOT DETECTED Final    Klebsiella pneumoniae NOT DETECTED NOT DETECTED Final   Proteus species NOT DETECTED NOT DETECTED Final   Serratia marcescens NOT DETECTED NOT DETECTED Final   Haemophilus influenzae NOT DETECTED NOT DETECTED Final   Neisseria meningitidis NOT DETECTED NOT DETECTED Final   Pseudomonas aeruginosa NOT DETECTED NOT DETECTED Final   Candida albicans NOT DETECTED NOT DETECTED Final   Candida glabrata NOT DETECTED NOT DETECTED Final   Candida krusei  NOT DETECTED NOT DETECTED Final   Candida parapsilosis NOT DETECTED NOT DETECTED Final   Candida tropicalis NOT DETECTED NOT DETECTED Final    Comment: Performed at [facility], [place] [address], [place], [place] [address]  Culture, blood (routine x 2)     Status: None   Collection Time: 01/25/19  8:37 AM   Specimen: BLOOD  Result Value Ref Range Status   Specimen Description   Final    BLOOD RIGHT ARM Performed at [facility] [address], [place], [place] [address]    Special Requests   Final    BOTTLES DRAWN AEROBIC AND ANAEROBIC Blood Culture adequate volume Performed at [facility] [address], [place], [place] [address]    Culture   Final    NO GROWTH 5 DAYS Performed at [facility], [place] [address] [place], [place] [address]    Report Status 01/30/2019 FINAL  Final  Culture, blood (routine x 2)     Status: None   Collection Time: 01/25/19  8:45 AM   Specimen: BLOOD RIGHT HAND  Result Value Ref Range Status   Specimen Description   Final    BLOOD RIGHT HAND Performed at [facility] [address], [place], [place] [address]    Special Requests   Final    BOTTLES DRAWN AEROBIC AND ANAEROBIC Blood Culture adequate volume Performed at [facility] [address], [place], [place] [address]    Culture   Final    NO GROWTH 5 DAYS Performed at [facility], [place] [address], [place], [place] [address]    Report Status 01/30/2019  FINAL  Final  MRSA PCR Screening     Status: Abnormal   Collection Time: 01/25/19  6:29 PM   Specimen: Urine, Random; Nasopharyngeal  Result Value Ref Range Status   MRSA by PCR POSITIVE (A) NEGATIVE Final    Comment:        The GeneXpert MRSA Assay (FDA approved for NASAL specimens only), is one component of a comprehensive MRSA colonization surveillance program. It is not intended to diagnose MRSA infection nor to guide or monitor treatment for MRSA infections. RESULT CALLED TO, READ BACK BY AND VERIFIED WITH: J.JALLOH AT 2242 ON 01/25/19 BY N.THOMPSON Performed at [facility], [place] [address], [place], [place] [address]   Urine Culture     Status: Abnormal   Collection Time: 01/25/19  6:30 PM   Specimen: Urine, Random  Result Value Ref Range Status   Specimen Description   Final    URINE, RANDOM Performed at [facility] [address], [place], [place] [address]    Special Requests   Final    NONE Performed at [facility], [place] [address], [place], [place] [address]    Culture 50,000 COLONIES/mL PSEUDOMONAS AERUGINOSA (A)  Final   Report Status 01/28/2019 FINAL  Final   Organism ID, Bacteria PSEUDOMONAS AERUGINOSA (A)  Final      Susceptibility   Pseudomonas aeruginosa - MIC*    CEFTAZIDIME >=64 RESISTANT Resistant     CIPROFLOXACIN <=0.25 SENSITIVE Sensitive     GENTAMICIN 8 INTERMEDIATE Intermediate     IMIPENEM 2 SENSITIVE Sensitive     CEFEPIME 16 INTERMEDIATE Intermediate     * 50,000 COLONIES/mL PSEUDOMONAS AERUGINOSA  SARS CORONAVIRUS 2 (TAT 6-24 HRS) Nasopharyngeal Nasopharyngeal Swab     Status: None   Collection Time: 01/30/19  1:10  PM   Specimen: Nasopharyngeal Swab  Result Value Ref Range Status   SARS Coronavirus 2 NEGATIVE NEGATIVE Final    Comment: (NOTE) SARS-CoV-2 target nucleic acids are NOT DETECTED. The SARS-CoV-2 RNA is generally detectable in upper and lower respiratory specimens  during the acute phase of infection. Negative results do not preclude SARS-CoV-2 infection, do not rule out co-infections with other pathogens, and should not be used as the sole basis for treatment or other patient management decisions. Negative results must be combined with clinical observations, patient history, and epidemiological information. The expected result is Negative. Fact Sheet for Patients: SugarRoll.be Fact Sheet for Healthcare Providers: https://www.woods-mathews.com/ This test is not yet approved or cleared by the [place] FDA and  has been authorized for detection and/or diagnosis of SARS-CoV-2 by FDA under an Emergency Use Authorization (EUA). This EUA will remain  in effect (meaning this test can be used) for the duration of the COVID-19 declaration under Section 56 4(b)(1) of the Act, 21 U.S.C. section 360bbb-3(b)(1), unless the authorization is terminated or revoked sooner. Performed at [facility], [place] [address] [place], [place] [address]          Radiology Studies: [place] Ekg Site Rite  Result Date: 01/29/2019 If [facility] image not attached, placement could not be confirmed due to current cardiac rhythm.       Scheduled Meds: . buPROPion  300 mg Oral Daily  . Chlorhexidine Gluconate Cloth  6 each Topical Daily  . enoxaparin (LOVENOX) injection  30 mg Subcutaneous Q24H  . escitalopram  5 mg Oral Daily  . feeding supplement  1 Container Oral TID BM  . feeding supplement (PRO-STAT SUGAR FREE 64)  30 mL Oral Daily  . lidocaine  1 patch Transdermal Q24H  . Melatonin  3 mg Oral QHS  . multivitamin with minerals  1 tablet Oral Daily  . pantoprazole  40 mg Oral Daily  . pravastatin  20 mg Oral Daily  . saccharomyces boulardii  250 mg Oral BID  . sodium chloride flush  10-40 mL Intracatheter Q12H  . vancomycin  125 mg Oral QID   Continuous Infusions: . [START ON 02/01/2019] vancomycin        LOS: 6 days    Time spent: 36 minutes spent on chart review, discussion with nursing staff, consultants, updating family and interview/physical exam; more than 50% of that time was spent in counseling and/or coordination of care.     J [place] DO [facility] Pager [phone]  If 7PM-7AM, please contact night-coverage [email or web address] Password [email or web address] 01/31/2019, 1:06 PM

## 2019-01-31 NOTE — Progress Notes (Signed)
Pharmacy Antibiotic Note  Katrina Boyer is a 68 y.o. female admitted on 01/24/2019 with bacteremia.  Pharmacy has been consulted for Vancomycin dosing.  01/31/2019 vanc peak = 46 vanc trough= 17 New calculated AUC 466.2, Cmax 32, Cmin 10.6   Plan: Change vancomycin to 555m IV q24h, stop date 02/07/2019 per ID for MRSA bacteremia Follow renal function   Height: 5' 4"  (162.6 cm) Weight: 104 lb 15 oz (47.6 kg) IBW/kg (Calculated) : 54.7  Temp (24hrs), Avg:98.4 F (36.9 C), Min:97.9 F (36.6 C), Max:99.1 F (37.3 C)  Recent Labs  Lab 01/25/19 0836 01/26/19 0531 01/29/19 0506 01/30/19 0841 01/31/19 0555  WBC 4.3 3.4* 4.7  --   --   CREATININE 0.97 0.78 0.81  --  0.75  VANCOTROUGH  --   --   --   --  17  VANCOPEAK  --   --   --  46*  --     Estimated Creatinine Clearance: 52.7 mL/min (by C-G formula based on SCr of 0.75 mg/dL).    Allergies  Allergen Reactions  . Piperacillin-Tazobactam In Dex     Thrombocytopenia   . Metronidazole     neuropathy  . Gabapentin   . Mercaptopurine Nausea Only  . Ciprofloxacin Rash    Antimicrobials this admission: 10/14 Vancomycin >>  10/14 Cefepime >> 10/15  Dose adjustments this admission:  10/21 change dose to 5056mIV q24h  Microbiology results: 10/14 bcx x2 (drawn after abx given): 2/2 staph epi (S= cipro, gent, rifamoin, bactrim, vanc; R= clin, eryth, tetra, oxac) FINAL 10/15 ucx:50K PsA (S= cipro,imip; R=ceftaz; I= cefepime, gent)--> MD's note on 10/17 suspects this is contamination 10/15 bcx x2: 10/15 MRSA PCR: positive  Thank you for allowing pharmacy to be a part of this patient's care.  ElDolly RiasPh 01/31/2019, 8:05 AM Pager [phone]

## 2019-02-01 LAB — BASIC METABOLIC PANEL
Anion gap: 6 (ref 5–15)
BUN: 22 mg/dL (ref 8–23)
CO2: 20 mmol/L — ABNORMAL LOW (ref 22–32)
Calcium: 10 mg/dL (ref 8.9–10.3)
Chloride: 106 mmol/L (ref 98–111)
Creatinine, Ser: 0.84 mg/dL (ref 0.44–1.00)
GFR calc Af Amer: >60 mL/min (ref >60)
GFR calc non Af Amer: >60 mL/min (ref >60)
Glucose, Bld: 90 mg/dL (ref 70–99)
Potassium: 4.5 mmol/L (ref 3.5–5.1)
Sodium: 132 mmol/L — ABNORMAL LOW (ref 135–145)

## 2019-02-01 LAB — HEMOGLOBIN A1C
Hgb A1c MFr Bld: 4.9 % (ref 4.8–5.6)
Mean Plasma Glucose: 93.93 mg/dL

## 2019-02-01 LAB — MAGNESIUM: Magnesium: 2 mg/dL (ref 1.7–2.4)

## 2019-02-01 LAB — GLUCOSE, CAPILLARY: Glucose-Capillary: 85 mg/dL (ref 70–99)

## 2019-02-01 MED ORDER — ZOLPIDEM TARTRATE 5 MG PO TABS: 5.0000 mg | ORAL_TABLET | Freq: Every evening | ORAL | 0 refills | Status: AC | PRN

## 2019-02-01 MED ORDER — PRO-STAT SUGAR FREE PO LIQD: 30.0000 mL | Freq: Every day | ORAL | 0 refills | Status: AC

## 2019-02-01 MED ORDER — MUSCLE RUB 10-15 % EX CREA: 1.0000 "application " | TOPICAL_CREAM | Freq: Three times a day (TID) | CUTANEOUS | 0 refills | Status: AC | PRN

## 2019-02-01 MED ORDER — VANCOMYCIN 50 MG/ML ORAL SOLUTION: 125.0000 mg | Freq: Four times a day (QID) | ORAL | 0 refills | Status: DC

## 2019-02-01 MED ORDER — TRAVASOL 10 % IV SOLN
INTRAVENOUS | Status: DC
  Filled 2019-02-01: qty 432

## 2019-02-01 MED ORDER — METHOCARBAMOL 500 MG PO TABS: 500.0000 mg | ORAL_TABLET | Freq: Four times a day (QID) | ORAL | 0 refills | Status: AC | PRN

## 2019-02-01 MED ORDER — INSULIN ASPART 100 UNIT/ML ~~LOC~~ SOLN: 0.0000 [IU] | Freq: Four times a day (QID) | SUBCUTANEOUS | Status: DC

## 2019-02-01 MED ORDER — ZINC OXIDE 40 % EX OINT: TOPICAL_OINTMENT | Freq: Two times a day (BID) | CUTANEOUS | 0 refills | Status: AC | PRN

## 2019-02-01 MED ORDER — ADULT MULTIVITAMIN W/MINERALS CH: 1.0000 | ORAL_TABLET | Freq: Every day | ORAL | 0 refills | Status: AC

## 2019-02-01 MED ORDER — OXYCODONE HCL 5 MG PO TABS: 5.0000 mg | ORAL_TABLET | ORAL | 0 refills | Status: AC | PRN

## 2019-02-01 MED ORDER — ONDANSETRON HCL 4 MG PO TABS: 4.0000 mg | ORAL_TABLET | Freq: Four times a day (QID) | ORAL | 0 refills | Status: AC | PRN

## 2019-02-01 MED ORDER — VANCOMYCIN IV (FOR PTA / DISCHARGE USE ONLY): 500.0000 mg | INTRAVENOUS | 0 refills | Status: AC

## 2019-02-01 NOTE — TOC Transition Note (Signed)
Transition of Care [facility]) - CM/SW Discharge Note   Patient Details  Name: Katrina Boyer MRN: [id number] Date of Birth: [birth year]  Transition of [facility]) CM/SW Contact:  Nila Nephew, LCSW Phone Number: [phone] 02/01/2019, 2:09 PM   Clinical Narrative:   Pt returning to [facility] SNF today, Report [phone]. PTAR transportation arranged.  Facility able to return to admissions today and pt requesting readmission. Completing readmission paperwork once she arrives at facility. Pt's son at bedside today agreeable to plan.  Pt's IV abx and TNA to continue at SNF.      Final next level of care: Skilled Nursing Facility Barriers to Discharge: Barriers Resolved   Patient Goals and CMS Choice Patient states their goals for this hospitalization and ongoing recovery are:: to get better CMS Medicare.gov Compare Post Acute Care list provided to:: Patient Choice offered to / list presented to : Patient  Discharge Placement                Patient to be transferred to facility by: [facility] Name of family member notified: son Ulice Dash Patient and family notified of of transfer: 02/01/19  Discharge Plan and Services   Discharge Planning Services: CM Consult Post Acute Care Choice: Nursing Home          DME Arranged: N/A DME Agency: NA       HH Arranged: NA HH Agency: NA        Social Determinants of Health (SDOH) Interventions     Readmission Risk Interventions No flowsheet data found.

## 2019-02-01 NOTE — Discharge Summary (Addendum)
Physician Discharge Summary  Katrina Boyer IEP:[id number] DOB: 02/01/1951 DOA: 01/24/2019  PCP: Seward Carol, MD  Admit date: 01/24/2019 Discharge date: 02/01/2019  Recommendations for Outpatient Follow-up:  Pt to be discharged to SNF. Resume home TPN or TNA per [facility] protocol. Follow up with PCP in 7-10 days after discharge from SNF. Continue IV Vancomycin as directed. Last day of therapy 02/07/2019. Continue oral Vancomycin x 14 days.  Contact information for after-discharge care     [facility] .   Service: Skilled Nursing Contact information: [place] [phone]               Discharge Diagnoses: Principal diagnosis is #1 MRSE bacteremia due to PICC Chronic recurrent C Diff Colitis Moderate Protein Calorie Malnutrition, requiring TPN Severe Crohns Disease with fistulas Anemia of chronic disease Urine colonized with pseudomonas  Discharge Condition: Fair  Disposition: SNF  Diet recommendation: NPO  Filed Weights   01/24/19 0239 01/31/19 0500  Weight: 45.8 kg 47.6 kg    History of present illness:   Katrina Boyer is a 68 y.o. female with medical history significant for Crohn's disease on TPN with perianal involvement, recurrent C. difficile, anxiety, was sent from SNF due to bacteremia.  Patient is currently being followed by gastroenterologist and surgeons in [place] [facility].  Patient noted to spike a temp in the SNF and subsequent blood culture drawn at the GI office was positive for gram-positive cocci.  Patient's gastroenterology was concerned for staph bacteremia given hx of PICC line, she was also concerned of possible spinal abscess as patient has been complaining of acute onset back pain, advised to send patient to the ED.  Patient currently complains of chronic right hip pain with no point tenderness, could not pinpoint her back pain.  Patient also complains of  chronic diarrhea, for which she has about 5 bowel movements per day (not worse from her baseline).  Patient currently is afebrile, denies any chills, denies any abdominal pain, nausea/vomiting, chest pain, shortness of breath, cough.  Of note, GI ultimate plan due to her severe Crohn's disease is to have colectomy and end ileostomy pending optimization of nutrition via TPN, treatment of recurrent C. difficile.   In the ED the patient was afebrile, with no leukocytosis, vital signs somewhat stable.  Labs showing sodium of 129, hemoglobin of 7.6, BC x2 pending, hip x-ray unremarkable.  COVID-19 negative  Hospital Course:  Katrina Boyer is a 68 year old female with severe medically refractory Crohn's disease with fistulas, on TPN and ultimate plan for total colectomy, currently at SNF, recent Pseudomonas bacteremia, MRSA pneumonia who presented from skilled nursing facility to the emergency room on request of her GI physician with positive blood cultures collected on 01/23/2019.  Patient is recovering at a SNF with her previous bacteremia, she had a PICC line, she also has C. difficile and on prolonged vancomycin taper, chronic diarrhea, had routine follow-up due to episode of chills last week, blood cultures drawn that were positive for MRSE and was asked to come to the nearest emergency room. In the emergency room, patient was hemodynamically stable.  No leukocytosis.  Admitted for management of bacteremia. The PICC was removed and replaced after management of the infection was demonstrated with no growth to surveillance cultures. The patient has remained NPO. Plan is for the patient to undergo total colectomy once bacteremia has been treated.  She will be discharged to SNF today on continued TPN,  IV Vancomycin and oral vancomycin.   Today's assessment: S: The patient is resting comfortably. No new complaints. O: Vitals:  Vitals:   01/31/19 2156 02/01/19 0559  BP: (!) 153/77 130/78  Pulse: 76 69  Resp:  17 16  Temp: 100 F (37.8 C) 98 F (36.7 C)  SpO2: 99% 97%   Exam:  Constitutional:  The patient is awake, alert, and oriented x 3. No acute distress. Respiratory:  No increased work of breathing. No wheezes, rales, or rhonchi No tactile fremitus Cardiovascular:  Regular rate and rhythm No murmurs, ectopy, or gallups. No lateral PMI. No thrills. Abdomen:  Abdomen is soft, non-tender, non-distended No hernias, masses, or organomegaly Normoactive bowel sounds.  Musculoskeletal:  No cyanosis, clubbing, or edema Skin:  No rashes, lesions, ulcers palpation of skin: no induration or nodules Neurologic:  CN 2-12 intact Sensation all 4 extremities intact Psychiatric:  Mental status Mood, affect appropriate Orientation to person, place, time  judgment and insight appear intact   Discharge Instructions  Discharge Instructions     Activity as tolerated - No restrictions   Complete by: As directed    Call MD for:  difficulty breathing, headache or visual disturbances   Complete by: As directed    Call MD for:  persistant nausea and vomiting   Complete by: As directed    Call MD for:  severe uncontrolled pain   Complete by: As directed    Call MD for:  temperature >100.4   Complete by: As directed    Discharge instructions   Complete by: As directed    Pt to be discharged to SNF. Resume home TPN or TNA per [facility]HI protocol. Follow up with PCP in 7-10 days after discharge from SNF. Continue IV Vancomycin as directed. Last day of therapy 02/07/2019. Continue oral Vancomycin x 14 days. PT/OT as per SNF   Home infusion instructions [facility] May follow [place] Dosing Protocol; May administer Cathflo as needed to maintain patency of vascular access device.; Flushing of vascular access device: per [facility] Protocol: 0.9% NaCl pre/post medica...   Complete by: As directed    Instructions: May follow [place] Dosing Protocol   Instructions: May administer Cathflo as  needed to maintain patency of vascular access device.   Instructions: Flushing of vascular access device: per [facility] Protocol: 0.9% NaCl pre/post medication administration and prn patency; Heparin 100 u/ml, 70m for implanted ports and Heparin 10u/ml, 551mfor all other central venous catheters.   Instructions: May follow [facility] Anaphylaxis Protocol for First Dose Administration in the home: 0.9% NaCl at 25-50 ml/hr to maintain IV access for protocol meds. Epinephrine 0.3 ml IV/IM PRN and Benadryl 25-50 IV/IM PRN s/s of anaphylaxis.   Instructions: [facility] Coordinator (RN) to assist per patient IV care needs in the home PRN.   Increase activity slowly   Complete by: As directed       Allergies as of 02/01/2019       Reactions   Piperacillin-tazobactam In Dex    Thrombocytopenia   Metronidazole    neuropathy   Gabapentin    Mercaptopurine Nausea Only   Ciprofloxacin Rash        Medication List     STOP taking these medications    Aspercreme w/Lidocaine 4 % Crea Generic drug: Lidocaine HCl   cholecalciferol 25 MCG (1000 UT) tablet Commonly known as: VITAMIN D3   [place] Crea   insulin aspart 100 UNIT/ML injection Commonly known as: novoLOG  potassium chloride 10 MEQ tablet Commonly known as: KLOR-CON   vancomycin 125 MG capsule Commonly known as: VANCOCIN Replaced by: vancomycin 50 mg/mL  oral solution   Vitamin D (Ergocalciferol) 1.25 MG (50000 UT) Caps capsule Commonly known as: DRISDOL       TAKE these medications    acetaminophen 325 MG tablet Commonly known as: TYLENOL Take 650 mg by mouth every 4 (four) hours as needed for mild pain.   albuterol (2.5 MG/3ML) 0.083% nebulizer solution Commonly known as: PROVENTIL Take 2.5 mg by nebulization every 4 (four) hours as needed for wheezing or shortness of breath.   buPROPion 300 MG 24 hr tablet Commonly known as: WELLBUTRIN XL Take 300 mg by mouth daily.   escitalopram 10 MG  tablet Commonly known as: LEXAPRO Take 5 mg by mouth daily.   feeding supplement (PRO-STAT SUGAR FREE 64) Liqd Take 30 mLs by mouth daily.   hyoscyamine 0.125 MG SL tablet Commonly known as: LEVSIN SL Place 0.125 mg under the tongue every 4 (four) hours as needed for cramping.   lidocaine 5 % Commonly known as: LIDODERM Place 1 patch onto the skin daily. Remove & Discard patch within 12 hours or as directed by MD   liver oil-zinc oxide 40 % ointment Commonly known as: DESITIN Apply topically 2 (two) times daily as needed for irritation.   loperamide 2 MG capsule Commonly known as: IMODIUM Take 4 mg by mouth 4 (four) times daily as needed for diarrhea or loose stools.   Melatonin 3 MG Tabs Take 3 mg by mouth at bedtime.   methocarbamol 500 MG tablet Commonly known as: ROBAXIN Take 1 tablet (500 mg total) by mouth every 6 (six) hours as needed for muscle spasms. What changed: when to take this   multivitamin with minerals Tabs tablet Take 1 tablet by mouth daily.   Muscle Rub 10-15 % Crea Apply 1 application topically 3 (three) times daily as needed for muscle pain.   ondansetron 4 MG tablet Commonly known as: ZOFRAN Take 1 tablet (4 mg total) by mouth every 6 (six) hours as needed for nausea.   oxyCODONE 5 MG immediate release tablet Commonly known as: Oxy IR/ROXICODONE Take 1 tablet (5 mg total) by mouth every 4 (four) hours as needed for moderate pain. What changed:  when to take this reasons to take this   pantoprazole 40 MG tablet Commonly known as: PROTONIX Take 40 mg by mouth daily.   pravastatin 20 MG tablet Commonly known as: PRAVACHOL Take 20 mg by mouth daily.   saccharomyces boulardii 250 MG capsule Commonly known as: FLORASTOR Take 250 mg by mouth 2 (two) times daily.   vancomycin  IVPB Inject 500 mg into the vein daily for 7 days. Indication:  MRSA bacteremia Last Day of Therapy:  02/07/2019 Labs - Sunday/Monday:  CBC/D, BMP, and vancomycin  trough. Labs - Thursday:  BMP and vancomycin trough Labs - Every other week:  ESR and CRP   vancomycin 50 mg/mL  oral solution Commonly known as: VANCOCIN Take 2.5 mLs (125 mg total) by mouth 4 (four) times daily. Replaces: vancomycin 125 MG capsule   zolpidem 5 MG tablet Commonly known as: AMBIEN Take 1 tablet (5 mg total) by mouth at bedtime as needed for sleep.               Home Infusion Instuctions  (From admission, onward)           Start     Ordered  02/01/19 0000  Home infusion instructions [facility] May follow [place] Dosing Protocol; May administer Cathflo as needed to maintain patency of vascular access device.; Flushing of vascular access device: per [facility] Protocol: 0.9% NaCl pre/post medica...    Question Answer Comment  Instructions May follow [place] Dosing Protocol   Instructions May administer Cathflo as needed to maintain patency of vascular access device.   Instructions Flushing of vascular access device: per [facility] Protocol: 0.9% NaCl pre/post medication administration and prn patency; Heparin 100 u/ml, 32m for implanted ports and Heparin 10u/ml, 531mfor all other central venous catheters.   Instructions May follow [facility] Anaphylaxis Protocol for First Dose Administration in the home: 0.9% NaCl at 25-50 ml/hr to maintain IV access for protocol meds. Epinephrine 0.3 ml IV/IM PRN and Benadryl 25-50 IV/IM PRN s/s of anaphylaxis.   Instructions [facility] Infusion Coordinator (RN) to assist per patient IV care needs in the home PRN.      02/01/19 1022           Allergies  Allergen Reactions   Piperacillin-Tazobactam In Dex     Thrombocytopenia    Metronidazole     neuropathy   Gabapentin    Mercaptopurine Nausea Only   Ciprofloxacin Rash    The results of significant diagnostics from this hospitalization (including imaging, microbiology, ancillary and laboratory) are listed below for reference.    Significant Diagnostic  Studies: Dg Chest 2 View  Result Date: 01/24/2019 CLINICAL DATA:  Positive blood cultures EXAM: CHEST - 2 VIEW COMPARISON:  None. FINDINGS: No acute airspace disease or effusion. Right upper extremity venous catheter tip over the brachiocephalic region. Normal heart size. No pneumothorax. Suspect skin fold artifact over the left upper chest IMPRESSION: 1. Right upper extremity catheter tip overlies the brachiocephalic region 2. No focal airspace disease Electronically Signed   By: KiDonavan Foil.D.   On: 01/24/2019 04:30   Dg Lumbar Spine Complete  Result Date: 01/24/2019 CLINICAL DATA:  Chronic pain EXAM: LUMBAR SPINE - COMPLETE 4+ VIEW COMPARISON:  None. FINDINGS: Five non rib-bearing lumbar type vertebra. Aortic atherosclerosis. Suspected transitional anatomy. For the purposes of reporting, first non rib-bearing lumbar vertebra will be designated L1. Alignment within normal limits. Treated compression deformity at L4. Mild to moderate degenerative changes at L3-L4, L4-L5 and L5-S1. IMPRESSION: No acute osseous abnormality. Mild to moderate diffuse degenerative change of the lower lumbar spine. Electronically Signed   By: KiDonavan Foil.D.   On: 01/24/2019 04:08   Ct Lumbar Spine W Contrast  Result Date: 01/24/2019 CLINICAL DATA:  Bacteremia. Chronic hip pain. EXAM: CT LUMBAR SPINE WITH CONTRAST TECHNIQUE: Multidetector CT imaging of the lumbar spine was performed with intravenous contrast administration. CONTRAST:  7574mMNIPAQUE IOHEXOL 300 MG/ML  SOLN COMPARISON:  Lumbar radiographs dated 01/24/2019 FINDINGS: Segmentation: 6 non-rib-bearing vertebra in the lumbar spine. I will term the first non-rib-bearing vertebra as L1 with lumbarization of S1. The left transverse process of S1 is fused with the remainder of the sacrum. Alignment: Normal. Vertebrae: There is an old mild compression fracture of L4 treated with vertebroplasty. Paraspinal and other soft tissues: There is slight enhancement of  the mucosa of the left renal pelvis and of the left ureter. The possibility of urinary tract infection should be considered. Aortic atherosclerosis. Several stones in the right kidney including 1 measuring 7 mm. Tiny stone in the upper pole of the left kidney. Large left renal cyst measuring at least 8 cm in diameter. 16[place]  mm cyst on the upper pole of the right kidney. Disc levels: L1-2: Normal. L2-3: Normal. L3-4: Small broad-based disc bulge with no neural impingement. Old healed mild compression fracture of the superior endplate of L3, treated with vertebroplasty. L4-5: Normal disc. Moderate right and mild left facet arthritis. L5-S1: Disc degeneration with a vacuum phenomenon and degenerative changes of the vertebral endplates to the left of midline. Tiny broad-based disc bulge without neural impingement. No foraminal stenosis. Mild right and moderately severe left facet arthritis. S1-2: Vestigial disc. No neural impingement. IMPRESSION: 1. Slight enhancement of the mucosa of the left renal pelvis and of the left ureter. The possibility of urinary tract infection should be considered. 2. Old healed mild compression fractures of L4. 3. Multilevel degenerative facet arthritis in the lumbar spine. Electronically Signed   By: Lorriane Shire M.D.   On: 01/24/2019 12:08   Dg Hips Bilat W Or Wo Pelvis 3-4 Views  Result Date: 01/24/2019 CLINICAL DATA:  Chronic hip and low back pain EXAM: DG HIP (WITH OR WITHOUT PELVIS) 3-4V BILAT COMPARISON:  None. FINDINGS: Treated compression deformity in the lower spine. The SI joints are non widened. The pubic symphysis and rami are intact. No fracture or malalignment. Joint spaces are relatively preserved. IMPRESSION: Negative. Electronically Signed   By: Donavan Foil M.D.   On: 01/24/2019 04:06   [place] Ekg Site Rite  Result Date: 01/29/2019 If Site Rite image not attached, placement could not be confirmed due to current cardiac rhythm.   Microbiology: Recent Results  (from the past 240 hour(s))  SARS Coronavirus 2 by RT PCR (hospital order, performed in [facility] hospital lab) Nasopharyngeal Nasopharyngeal Swab     Status: None   Collection Time: 01/24/19  4:11 AM   Specimen: Nasopharyngeal Swab  Result Value Ref Range Status   SARS Coronavirus 2 NEGATIVE NEGATIVE Final    Comment: (NOTE) If result is NEGATIVE SARS-CoV-2 target nucleic acids are NOT DETECTED. The SARS-CoV-2 RNA is generally detectable in upper and lower  respiratory specimens during the acute phase of infection. The lowest  concentration of SARS-CoV-2 viral copies this assay can detect is 250  copies / mL. A negative result does not preclude SARS-CoV-2 infection  and should not be used as the sole basis for treatment or other  patient management decisions.  A negative result may occur with  improper specimen collection / handling, submission of specimen other  than nasopharyngeal swab, presence of viral mutation(s) within the  areas targeted by this assay, and inadequate number of viral copies  (<250 copies / mL). A negative result must be combined with clinical  observations, patient history, and epidemiological information. If result is POSITIVE SARS-CoV-2 target nucleic acids are DETECTED. The SARS-CoV-2 RNA is generally detectable in upper and lower  respiratory specimens dur ing the acute phase of infection.  Positive  results are indicative of active infection with SARS-CoV-2.  Clinical  correlation with patient history and other diagnostic information is  necessary to determine patient infection status.  Positive results do  not rule out bacterial infection or co-infection with other viruses. If result is PRESUMPTIVE POSTIVE SARS-CoV-2 nucleic acids MAY BE PRESENT.   A presumptive positive result was obtained on the submitted specimen  and confirmed on repeat testing.  While 2019 novel coronavirus  (SARS-CoV-2) nucleic acids may be present in the submitted sample   additional confirmatory testing may be necessary for epidemiological  and / or clinical management purposes  to differentiate between  SARS-CoV-2  and other Sarbecovirus currently known to infect humans.  If clinically indicated additional testing with an alternate test  methodology [phone]) is advised. The SARS-CoV-2 RNA is generally  detectable in upper and lower respiratory sp ecimens during the acute  phase of infection. The expected result is Negative. Fact Sheet for Patients:  StrictlyIdeas.no Fact Sheet for Healthcare Providers: BankingDealers.co.za This test is not yet approved or cleared by the [place] FDA and has been authorized for detection and/or diagnosis of SARS-CoV-2 by FDA under an Emergency Use Authorization (EUA).  This EUA will remain in effect (meaning this test can be used) for the duration of the COVID-19 declaration under Section 564(b)(1) of the Act, 21 U.S.C. section 360bbb-3(b)(1), unless the authorization is terminated or revoked sooner. Performed at [facility], [place] [address], [place], [place] [address]   Blood culture (routine x 2)     Status: Abnormal   Collection Time: 01/24/19  4:12 AM   Specimen: BLOOD  Result Value Ref Range Status   Specimen Description   Final    BLOOD LEFT ANTECUBITAL Performed at [facility] [address], [place], [place] [address]    Special Requests   Final    BOTTLES DRAWN AEROBIC AND ANAEROBIC Blood Culture adequate volume Performed at [facility] [address] [place], [place] [address]    Culture  Setup Time   Final    GRAM POSITIVE COCCI IN BOTH AEROBIC AND ANAEROBIC BOTTLES CRITICAL VALUE NOTED.  VALUE IS CONSISTENT WITH PREVIOUSLY REPORTED AND CALLED VALUE.    Culture (A)  Final    STAPHYLOCOCCUS EPIDERMIDIS SUSCEPTIBILITIES PERFORMED ON PREVIOUS CULTURE WITHIN THE LAST 5 DAYS. Performed at  [facility], [place] [address], [place], [place] [address]    Report Status 01/27/2019 FINAL  Final  Blood culture (routine x 2)     Status: Abnormal   Collection Time: 01/24/19  4:15 AM   Specimen: BLOOD  Result Value Ref Range Status   Specimen Description   Final    BLOOD BLOOD RIGHT FOREARM Performed at [facility] [address], [place], [place] [address]    Special Requests   Final    BOTTLES DRAWN AEROBIC AND ANAEROBIC Blood Culture adequate volume Performed at [facility] [address], [place], [place] [address]    Culture  Setup Time   Final    GRAM POSITIVE COCCI IN BOTH AEROBIC AND ANAEROBIC BOTTLES CRITICAL RESULT CALLED TO, READ BACK BY AND VERIFIED WITH: PHRMD L GREEN @ 1194 01/25/19 BY S GEZAHEGN Performed at [facility], [place] [address] [place], [place] [address]    Culture STAPHYLOCOCCUS EPIDERMIDIS (A)  Final   Report Status 01/27/2019 FINAL  Final   Organism ID, Bacteria STAPHYLOCOCCUS EPIDERMIDIS  Final      Susceptibility   Staphylococcus epidermidis - MIC*    CIPROFLOXACIN <=0.5 SENSITIVE Sensitive     ERYTHROMYCIN >=8 RESISTANT Resistant     GENTAMICIN <=0.5 SENSITIVE Sensitive     OXACILLIN >=4 RESISTANT Resistant     TETRACYCLINE >=16 RESISTANT Resistant     VANCOMYCIN 1 SENSITIVE Sensitive     TRIMETH/SULFA <=10 SENSITIVE Sensitive     CLINDAMYCIN >=8 RESISTANT Resistant     RIFAMPIN <=0.5 SENSITIVE Sensitive     Inducible Clindamycin NEGATIVE Sensitive     * STAPHYLOCOCCUS EPIDERMIDIS  Blood Culture ID Panel (Reflexed)     Status: Abnormal   Collection Time: 01/24/19  4:15  AM  Result Value Ref Range Status   Enterococcus species NOT DETECTED NOT DETECTED Final   Listeria monocytogenes NOT DETECTED NOT DETECTED Final   Staphylococcus species DETECTED (A) NOT DETECTED Final    Comment: Methicillin (oxacillin) resistant coagulase negative staphylococcus. Possible blood culture contaminant  (unless isolated from more than one blood culture draw or clinical case suggests pathogenicity). No antibiotic treatment is indicated for blood  culture contaminants. CRITICAL RESULT CALLED TO, READ BACK BY AND VERIFIED WITH: PHRMD L GREEN @ [id number] 01/25/19 BY S GEZAHEGN    Staphylococcus aureus (BCID) NOT DETECTED NOT DETECTED Final   Methicillin resistance DETECTED (A) NOT DETECTED Final    Comment: CRITICAL RESULT CALLED TO, READ BACK BY AND VERIFIED WITH: PHRMD L GREEN @ 1194 01/25/19 BY S GEZAHEGN    Streptococcus species NOT DETECTED NOT DETECTED Final   Streptococcus agalactiae NOT DETECTED NOT DETECTED Final   Streptococcus pneumoniae NOT DETECTED NOT DETECTED Final   Streptococcus pyogenes NOT DETECTED NOT DETECTED Final   Acinetobacter baumannii NOT DETECTED NOT DETECTED Final   Enterobacteriaceae species NOT DETECTED NOT DETECTED Final   Enterobacter cloacae complex NOT DETECTED NOT DETECTED Final   Escherichia coli NOT DETECTED NOT DETECTED Final   Klebsiella oxytoca NOT DETECTED NOT DETECTED Final   Klebsiella pneumoniae NOT DETECTED NOT DETECTED Final   Proteus species NOT DETECTED NOT DETECTED Final   Serratia marcescens NOT DETECTED NOT DETECTED Final   Haemophilus influenzae NOT DETECTED NOT DETECTED Final   Neisseria meningitidis NOT DETECTED NOT DETECTED Final   Pseudomonas aeruginosa NOT DETECTED NOT DETECTED Final   Candida albicans NOT DETECTED NOT DETECTED Final   Candida glabrata NOT DETECTED NOT DETECTED Final   Candida krusei NOT DETECTED NOT DETECTED Final   Candida parapsilosis NOT DETECTED NOT DETECTED Final   Candida tropicalis NOT DETECTED NOT DETECTED Final    Comment: Performed at [facility], [place]. [address] [place], [place] [address]  Culture, blood (routine x 2)     Status: None   Collection Time: 01/25/19  8:37 AM   Specimen: BLOOD  Result Value Ref Range Status   Specimen Description   Final    BLOOD RIGHT ARM Performed at [facility] [address], [place], [place] [address]    Special Requests   Final    BOTTLES DRAWN AEROBIC AND ANAEROBIC Blood Culture adequate volume Performed at [facility] [address] [place], [place] [address]    Culture   Final    NO GROWTH 5 DAYS Performed at [facility], [place] [address], [place], [place] [address]    Report Status 01/30/2019 FINAL  Final  Culture, blood (routine x 2)     Status: None   Collection Time: 01/25/19  8:45 AM   Specimen: BLOOD RIGHT HAND  Result Value Ref Range Status   Specimen Description   Final    BLOOD RIGHT HAND Performed at [facility] [address], [place], [place] [address]    Special Requests   Final    BOTTLES DRAWN AEROBIC AND ANAEROBIC Blood Culture adequate volume Performed at [facility] [address], [place], [place] [address]    Culture   Final    NO GROWTH 5 DAYS Performed at [facility], [place] [address] [place],  [address]    Report Status 01/30/2019 FINAL  Final  MRSA PCR Screening     Status: Abnormal   Collection  Time: 01/25/19  6:29 PM   Specimen: Urine, Random; Nasopharyngeal  Result Value Ref Range Status   MRSA by PCR POSITIVE (A) NEGATIVE Final    Comment:        The GeneXpert MRSA Assay (FDA approved for NASAL specimens only), is one component of a comprehensive MRSA colonization surveillance program. It is not intended to diagnose MRSA infection nor to guide or monitor treatment for MRSA infections. RESULT CALLED TO, READ BACK BY AND VERIFIED WITH: J.JALLOH AT 2242 ON 01/25/19 BY N.THOMPSON Performed at [facility], [place] [address] [place], [place] [address]   Urine Culture     Status: Abnormal   Collection Time: 01/25/19  6:30 PM   Specimen: Urine, Random  Result Value Ref Range Status   Specimen Description   Final    URINE, RANDOM Performed at [facility] [address] [place], [place] [address]    Special Requests   Final    NONE Performed at [facility], [place] [address] [place], [place] [address]    Culture 50,000 COLONIES/mL PSEUDOMONAS AERUGINOSA (A)  Final   Report Status 01/28/2019 FINAL  Final   Organism ID, Bacteria PSEUDOMONAS AERUGINOSA (A)  Final      Susceptibility   Pseudomonas aeruginosa - MIC*    CEFTAZIDIME >=64 RESISTANT Resistant     CIPROFLOXACIN <=0.25 SENSITIVE Sensitive     GENTAMICIN 8 INTERMEDIATE Intermediate     IMIPENEM 2 SENSITIVE Sensitive     CEFEPIME 16 INTERMEDIATE Intermediate     * 50,000 COLONIES/mL PSEUDOMONAS AERUGINOSA  SARS CORONAVIRUS 2 (TAT 6-24 HRS) Nasopharyngeal Nasopharyngeal Swab     Status: None   Collection Time: 01/30/19  1:10 PM   Specimen: Nasopharyngeal Swab  Result Value Ref Range Status   SARS Coronavirus 2 NEGATIVE NEGATIVE Final    Comment: (NOTE) SARS-CoV-2 target nucleic acids are NOT DETECTED. The SARS-CoV-2 RNA is generally detectable in upper and lower respiratory specimens during the acute phase of infection. Negative results do not preclude SARS-CoV-2 infection, do not rule out co-infections with other pathogens, and should not be used as the sole basis for treatment or other patient management decisions. Negative results must be combined with clinical observations, patient history, and epidemiological information. The expected result is Negative. Fact Sheet for Patients: SugarRoll.be Fact Sheet for Healthcare Providers: https://www.woods-mathews.com/ This test is not yet approved or cleared by the [place] FDA and  has been authorized for detection and/or diagnosis of SARS-CoV-2 by FDA under an Emergency Use Authorization (EUA). This EUA will remain  in effect (meaning this test can be used) for the duration of the COVID-19 declaration under Section 56 4(b)(1) of the Act, 21 U.S.C. section  360bbb-3(b)(1), unless the authorization is terminated or revoked sooner. Performed at [facility], [place] [address], [place], [place] [address]      Labs: Basic Metabolic Panel: Recent Labs  Lab 01/26/19 0531 01/29/19 0506 01/31/19 0555 02/01/19 0639  NA 133* 135 133* 132*  K 3.9 3.5 3.4* 4.5  CL 111 112* 108 106  CO2 15* 16* 19* 20*  GLUCOSE 94 91 92 90  BUN 27* _0 CREATININE 0.78 0.81 0.75 0.84  CALCIUM 9.4 9.5 9.6 10.0  MG 1.8 1.8 1.9 2.0  PHOS 3.9 3.8 4.1  --    Liver Function Tests: Recent Labs  Lab 01/26/19 0531  AST 30  ALT 38  ALKPHOS 127*  BILITOT 0.3  PROT 6.4*  ALBUMIN 2.0*   No results for input(s): LIPASE, AMYLASE in the last 168 hours. No results for input(s): AMMONIA in the last 168 hours. CBC: Recent Labs  Lab 01/25/19 1349 01/26/19 0531 01/26/19 1538 01/29/19 0506 01/31/19 0555  WBC  --  3.4*  --  4.7 4.2  NEUTROABS  --  2.0  --  3.1 2.5  HGB  --  6.9* 9.2* 9.1* 8.4*  HCT 23.2* 22.6* 30.7* 30.8* 27.5*  MCV  --  92.6  --  93.1 90.8  PLT  --  199  --  235 254   Cardiac Enzymes: No results for input(s): CKTOTAL, CKMB, CKMBINDEX, TROPONINI in the last 168 hours. BNP: BNP (last 3 results) No results for input(s): BNP in the last 8760 hours.  ProBNP (last 3 results) No results for input(s): PROBNP in the last 8760 hours.  CBG: Recent Labs  Lab 01/28/19 0730 01/28/19 1607 01/30/19 0744 01/31/19 0740 02/01/19 0725  GLUCAP 91 132* 103* 83 85    Principal Problem:   Bacteremia Active Problems:   Crohn's disease ([place])   MRSA bacteremia   Time coordinating discharge: 38 minutes  Signed:        Jeffre Enriques, DO [facility]  02/01/2019, 12:28 PM

## 2019-02-01 NOTE — NC FL2 (Signed)
[place] LEVEL OF CARE SCREENING TOOL     IDENTIFICATION  Patient Name: Katrina Boyer Birthdate: [birth year] Sex: female Admission Date (Current Location): 01/24/2019  [facility] and [place] Number:  Herbalist and Address:  [facility],  [place] [address] [place]      Provider Number: [id number]  Attending Physician Name and Address:  Karie Kirks, DO  Relative Name and Phone Number:       Current Level of Care: Hospital Recommended Level of Care: [place] Prior Approval Number:    Date Approved/Denied: (ends 02/11/2019) PASRR Number: [id number]  Discharge Plan: SNF    Current Diagnoses: Patient Active Problem List   Diagnosis Date Noted  . MRSA bacteremia 01/25/2019  . Bacteremia 01/24/2019  . Crohn's disease ([place]) 01/24/2019    Orientation RESPIRATION BLADDER Height & Weight     Self, Time, Situation, Place  Normal Incontinent Weight: 104 lb 15 oz (47.6 kg) Height:  5' 4"  (162.6 cm)  BEHAVIORAL SYMPTOMS/MOOD NEUROLOGICAL BOWEL NUTRITION STATUS      Incontinent Diet, TNA((70ms/hr))  AMBULATORY STATUS COMMUNICATION OF NEEDS Skin   Extensive Assist Verbally Normal(PICC)                       Personal Care Assistance Level of Assistance  Bathing, Feeding, Dressing Bathing Assistance: Maximum assistance Feeding assistance: Maximum assistance(TPN) Dressing Assistance: Maximum assistance     Functional Limitations Info  Sight, Hearing, Speech Sight Info: Adequate Hearing Info: Adequate Speech Info: Adequate    SPECIAL CARE FACTORS FREQUENCY  PT (By licensed PT), OT (By licensed OT)     PT Frequency: 5x OT Frequency: 5x            Contractures Contractures Info: Not present    Additional Factors Info  Code Status, Allergies, Isolation Precautions Code Status Info: Full code Allergies Info: Piperacillin-tazobactam In Dex, Metronidazole, Gabapentin, Mercaptopurine, Ciprofloxacin      Isolation Precautions Info: enteric precautions MRSA bacteremia     Current Medications (02/01/2019):  This is the current hospital active medication list Current Facility-Administered Medications  Medication Dose Route Frequency Provider Last Rate Last Dose  . acetaminophen (TYLENOL) tablet 650 mg  650 mg Oral Q6H PRN EAlma Friendly MD   650 mg at 01/30/19 2206   Or  . acetaminophen (TYLENOL) suppository 650 mg  650 mg Rectal Q6H PRN EAlma Friendly MD      . albuterol (PROVENTIL) (2.5 MG/3ML) 0.083% nebulizer solution 2.5 mg  2.5 mg Nebulization Q2H PRN EAlma Friendly MD      . buPROPion (WELLBUTRIN XL) 24 hr tablet 300 mg  300 mg Oral Daily EAlma Friendly MD   300 mg at 02/01/19 1022  . Chlorhexidine Gluconate Cloth 2 % PADS 6 each  6 each Topical Daily GBarb Merino MD   6 each at 01/31/19 1018  . enoxaparin (LOVENOX) injection 40 mg  40 mg Subcutaneous Q24H [place] J, DO   40 mg at 01/31/19 2202  . escitalopram (LEXAPRO) tablet 5 mg  5 mg Oral Daily EAlma Friendly MD   5 mg at 02/01/19 1023  . feeding supplement (BOOST / RESOURCE BREEZE) liquid 1 Container  1 Container Oral TID BM GBarb Merino MD   1 Container at 02/01/19 1021  . feeding supplement (PRO-STAT SUGAR FREE 64) liquid 30 mL  30 mL Oral Daily [place] EDonnamarie Poag DO   30 mL at 02/01/19 1021  . hyoscyamine (  LEVSIN SL) SL tablet 0.125 mg  0.125 mg Sublingual Q4H PRN Alma Friendly, MD   0.125 mg at 01/31/19 1017  . [START ON 02/02/2019] insulin aspart (novoLOG) injection 0-9 Units  0-9 Units Subcutaneous Q6H Angela Adam, RPH      . lidocaine (LIDODERM) 5 % 1 patch  1 patch Transdermal Q24H Alma Friendly, MD   1 patch at 02/01/19 1024  . liver oil-zinc oxide (DESITIN) 40 % ointment   Topical BID PRN Alma Friendly, MD      . loperamide (IMODIUM) capsule 4 mg  4 mg Oral QID PRN Alma Friendly, MD   4 mg at 01/30/19 0048  . Melatonin TABS 3 mg  3 mg Oral QHS  Alma Friendly, MD   3 mg at 01/31/19 2202  . methocarbamol (ROBAXIN) tablet 500 mg  500 mg Oral Q6H PRN Barb Merino, MD   500 mg at 02/01/19 1022  . multivitamin with minerals tablet 1 tablet  1 tablet Oral Daily Barb Merino, MD   1 tablet at 02/01/19 1033  . Muscle Rub CREA   Topical TID PRN Alma Friendly, MD      . ondansetron [facility]) tablet 4 mg  4 mg Oral Q6H PRN Alma Friendly, MD       Or  . ondansetron [facility]) injection 4 mg  4 mg Intravenous Q6H PRN Alma Friendly, MD      . oxyCODONE (Oxy IR/ROXICODONE) immediate release tablet 5 mg  5 mg Oral Q4H PRN Alma Friendly, MD   5 mg at 02/01/19 3794  . pantoprazole (PROTONIX) EC tablet 40 mg  40 mg Oral Daily Alma Friendly, MD   40 mg at 02/01/19 1022  . pravastatin (PRAVACHOL) tablet 20 mg  20 mg Oral Daily Alma Friendly, MD   20 mg at 02/01/19 1022  . saccharomyces boulardii (FLORASTOR) capsule 250 mg  250 mg Oral BID Alma Friendly, MD   250 mg at 02/01/19 1021  . sodium chloride flush (NS) 0.9 % injection 10-40 mL  10-40 mL Intracatheter Q12H Barb Merino, MD   10 mL at 01/31/19 2230  . sodium chloride flush (NS) 0.9 % injection 10-40 mL  10-40 mL Intracatheter PRN Barb Merino, MD      . TPN ADULT (ION)   Intravenous Continuous TPN Angela Adam, [facility]      . vancomycin (VANCOCIN) 50 mg/mL oral solution 125 mg  125 mg Oral QID Alma Friendly, MD   125 mg at 02/01/19 1033  . vancomycin (VANCOCIN) 500 mg in sodium chloride 0.9 % 100 mL IVPB  500 mg Intravenous Q24H Angela Adam, RPH 100 mL/hr at 02/01/19 0647 500 mg at 02/01/19 3276  . zolpidem (AMBIEN) tablet 5 mg  5 mg Oral QHS PRN Schorr, Rhetta Mura, NP   5 mg at 01/31/19 2230     Discharge Medications: Please see discharge summary for a list of discharge medications.  Relevant Imaging Results:  Relevant Lab Results:   Additional Information [id number]. Vancomycin 555m IV q24h, stop date  02/07/2019  MNila Nephew LCSW

## 2019-02-01 NOTE — Progress Notes (Signed)
Scripts for Oxycodone not signed per MD. MD to sign script and script will be faxed to [facility] in the morning. Oncoming charge nurse made aware.

## 2019-02-01 NOTE — Progress Notes (Signed)
Attempted to call report forwarded to voicemail, message left.

## 2019-02-01 NOTE — Progress Notes (Signed)
PHARMACY - ADULT TOTAL PARENTERAL NUTRITION CONSULT NOTE   Pharmacy Consult for TPN Indication: severe refractory crohn's with fistulas  Patient Measurements: Height: 5' 4"  (162.6 cm) Weight: 104 lb 15 oz (47.6 kg) IBW/kg (Calculated) : 54.7 TPN AdjBW (KG): 45.8 Body mass index is 18.01 kg/m. Usual Weight:   Insulin Requirements: none  Current Nutrition: boost tid, prostat once daily  IVF: none  Central access: 10/20 TPN start date: 10/22  ASSESSMENT                                                                                                          HPI:  68 year old female with medical history of anxiety, recurrent C.diff, and severe refractory Crohn's disease with fistulas. She has been on TPN and resides at [facility]. Ultimate plan is for total colectomy pending nutrition optimization. She was recent diagnosed with pseudomonas bacteremia and MRSA PNA Significant events:  10/14 admitted from SNF with PICC on TPN 10/15 PICC removed 10/20 PICC replaced  Today:    Glucose - at goal < 150, no insulin ordered  Electrolytes -Na 132, CO2 20 all others WNL  Renal - WNL  LFTs -will order with am labs  TGs -will order with am labs  Prealbumin -will order with am labs  NUTRITIONAL GOALS                                                                                              Nutritional Goals (per RD recommendation on 10/21): [id number] Protein: 80-95 Fluid:>/= 2.2 L/d   Plan:   start TPN at 40 mL/hr.  At goal (81ms/hr)  this TPN provides 81 g of protein, 270  g of dextrose, and 36 g of lipids which provides 1602 kCals per day, meeting 100% of patient needs  Electrolytes in TPN: Na 528m, KCL 5071m Mag 5me72m, Ca 5meq81m Phos 15mmo25m Cl: Acetate ratio 1:1  Add MVI, trace elements to TPN  Add sensitive SSI q6h and adjust as needed  IVF per MD  TPN lab panels on Mondays & Thursdays.  F/U ability to advance to goal  Ellen Dolly Rias0/22/2020,  10:19 AM Pager [phone]

## 2019-02-01 NOTE — Care Management Important Message (Signed)
Important Message  Patient Details IM Letter given to Sharren Bridge SW to present to the Patient Name: Katrina Boyer MRN: [id number] Date of Birth: August 06, 1950   Medicare Important Message Given:  Yes     Kerin Salen 02/01/2019, 10:42 AM

## 2019-02-26 ENCOUNTER — Emergency Department (HOSPITAL_COMMUNITY)
Admission: EM | Admit: 2019-02-26 | Discharge: 2019-02-26 | Disposition: A | Discharge status: Home / Self Care | Payer: Medicare Other | Attending: Emergency Medicine | Admitting: Emergency Medicine

## 2019-02-26 ENCOUNTER — Encounter (HOSPITAL_COMMUNITY): Payer: Self-pay | Admitting: Family Medicine

## 2019-02-26 ENCOUNTER — Emergency Department (HOSPITAL_COMMUNITY): Payer: Medicare Other

## 2019-02-26 DIAGNOSIS — N390 Urinary tract infection, site not specified: Secondary | ICD-10-CM | POA: Diagnosis not present

## 2019-02-26 DIAGNOSIS — R319 Hematuria, unspecified: Secondary | ICD-10-CM | POA: Diagnosis not present

## 2019-02-26 DIAGNOSIS — Z87891 Personal history of nicotine dependence: Secondary | ICD-10-CM | POA: Insufficient documentation

## 2019-02-26 DIAGNOSIS — K5732 Diverticulitis of large intestine without perforation or abscess without bleeding: Secondary | ICD-10-CM | POA: Insufficient documentation

## 2019-02-26 DIAGNOSIS — N939 Abnormal uterine and vaginal bleeding, unspecified: Secondary | ICD-10-CM | POA: Diagnosis not present

## 2019-02-26 DIAGNOSIS — K5792 Diverticulitis of intestine, part unspecified, without perforation or abscess without bleeding: Secondary | ICD-10-CM

## 2019-02-26 DIAGNOSIS — Z79899 Other long term (current) drug therapy: Secondary | ICD-10-CM | POA: Diagnosis not present

## 2019-02-26 HISTORY — DX: Crohn's disease, unspecified, without complications: K50.90

## 2019-02-26 HISTORY — DX: Other lack of coordination: R27.8

## 2019-02-26 HISTORY — DX: Unsteadiness on feet: R26.81

## 2019-02-26 HISTORY — DX: Muscle weakness (generalized): M62.81

## 2019-02-26 HISTORY — DX: Cognitive communication deficit: R41.841

## 2019-02-26 HISTORY — DX: Bacteremia: R78.81

## 2019-02-26 LAB — WET PREP, GENITAL
Sperm: NONE SEEN
Trich, Wet Prep: NONE SEEN

## 2019-02-26 LAB — CBC WITH DIFFERENTIAL/PLATELET
Abs Immature Granulocytes: 0.05 10*3/uL (ref 0.00–0.07)
Basophils Absolute: 0.1 10*3/uL (ref 0.0–0.1)
Basophils Relative: 1 %
Eosinophils Absolute: 0 10*3/uL (ref 0.0–0.5)
Eosinophils Relative: 0 %
HCT: 30.1 % — ABNORMAL LOW (ref 36.0–46.0)
Hemoglobin: 9.5 g/dL — ABNORMAL LOW (ref 12.0–15.0)
Immature Granulocytes: 0 %
Lymphocytes Relative: 21 %
Lymphs Abs: 2.4 10*3/uL (ref 0.7–4.0)
MCH: 28.5 pg (ref 26.0–34.0)
MCHC: 31.6 g/dL (ref 30.0–36.0)
MCV: 90.4 fL (ref 80.0–100.0)
Monocytes Absolute: 0.8 10*3/uL (ref 0.1–1.0)
Monocytes Relative: 7 %
Neutro Abs: 8.5 10*3/uL — ABNORMAL HIGH (ref 1.7–7.7)
Neutrophils Relative %: 71 %
Platelets: 372 10*3/uL (ref 150–400)
RBC: 3.33 MIL/uL — ABNORMAL LOW (ref 3.87–5.11)
RDW: 17.1 % — ABNORMAL HIGH (ref 11.5–15.5)
WBC: 11.8 10*3/uL — ABNORMAL HIGH (ref 4.0–10.5)
nRBC: 0 % (ref 0.0–0.2)

## 2019-02-26 LAB — URINALYSIS, ROUTINE W REFLEX MICROSCOPIC
Bacteria, UA: NONE SEEN
Bilirubin Urine: NEGATIVE
Glucose, UA: NEGATIVE mg/dL
Ketones, ur: 5 mg/dL — AB
Nitrite: POSITIVE — AB
Protein, ur: 100 mg/dL — AB
Specific Gravity, Urine: 1.011 (ref 1.005–1.030)
pH: 6 (ref 5.0–8.0)

## 2019-02-26 LAB — COMPREHENSIVE METABOLIC PANEL
ALT: 33 U/L (ref 0–44)
AST: 34 U/L (ref 15–41)
Albumin: 2.6 g/dL — ABNORMAL LOW (ref 3.5–5.0)
Alkaline Phosphatase: 146 U/L — ABNORMAL HIGH (ref 38–126)
Anion gap: 8 (ref 5–15)
BUN: 56 mg/dL — ABNORMAL HIGH (ref 8–23)
CO2: 23 mmol/L (ref 22–32)
Calcium: 10.9 mg/dL — ABNORMAL HIGH (ref 8.9–10.3)
Chloride: 100 mmol/L (ref 98–111)
Creatinine, Ser: 1.29 mg/dL — ABNORMAL HIGH (ref 0.44–1.00)
GFR calc Af Amer: 50 mL/min — ABNORMAL LOW (ref >60)
GFR calc non Af Amer: 43 mL/min — ABNORMAL LOW (ref >60)
Glucose, Bld: 113 mg/dL — ABNORMAL HIGH (ref 70–99)
Potassium: 4.5 mmol/L (ref 3.5–5.1)
Sodium: 131 mmol/L — ABNORMAL LOW (ref 135–145)
Total Bilirubin: 0.6 mg/dL (ref 0.3–1.2)
Total Protein: 8.1 g/dL (ref 6.5–8.1)

## 2019-02-26 MED ORDER — FLUCONAZOLE 150 MG PO TABS
150.0000 mg | ORAL_TABLET | Freq: Once | ORAL | Status: AC
  Administered 2019-02-26: 150 mg via ORAL
  Filled 2019-02-26: qty 1

## 2019-02-26 MED ORDER — SODIUM CHLORIDE 0.9 % IV SOLN
1.0000 g | Freq: Once | INTRAVENOUS | Status: AC
  Administered 2019-02-26: 1 g via INTRAVENOUS
  Filled 2019-02-26: qty 10

## 2019-02-26 MED ORDER — CEPHALEXIN 500 MG PO CAPS: 500.0000 mg | ORAL_CAPSULE | Freq: Two times a day (BID) | ORAL | 0 refills | Status: AC

## 2019-02-26 MED ORDER — IOHEXOL 300 MG/ML  SOLN
75.0000 mL | Freq: Once | INTRAMUSCULAR | Status: AC | PRN
  Administered 2019-02-26: 75 mL via INTRAVENOUS

## 2019-02-26 MED ORDER — METRONIDAZOLE 500 MG PO TABS: 500.0000 mg | ORAL_TABLET | Freq: Two times a day (BID) | ORAL | 0 refills | Status: AC

## 2019-02-26 MED ORDER — SODIUM CHLORIDE (PF) 0.9 % IJ SOLN
INTRAMUSCULAR | Status: AC
  Filled 2019-02-26: qty 50

## 2019-02-26 MED ORDER — MORPHINE SULFATE (PF) 4 MG/ML IV SOLN
4.0000 mg | Freq: Once | INTRAVENOUS | Status: AC
  Administered 2019-02-26: 4 mg via INTRAVENOUS
  Filled 2019-02-26: qty 1

## 2019-02-26 MED ORDER — ONDANSETRON HCL 4 MG/2ML IJ SOLN
4.0000 mg | Freq: Once | INTRAMUSCULAR | Status: AC
  Administered 2019-02-26: 4 mg via INTRAVENOUS
  Filled 2019-02-26: qty 2

## 2019-02-26 NOTE — ED Notes (Signed)
[facility] called for transport.  

## 2019-02-26 NOTE — ED Notes (Signed)
Attempted to call [facility] with no answer.

## 2019-02-26 NOTE — ED Triage Notes (Signed)
Patient is from [facility] and transported via [facility]. According to EMS, her complaint is minimal vaginal bleeding for the last two. Today, it has increased with clots and rectal pain. She rated her pain 10/10, even though she has been on her phone watching youtube during transport. Pt appears in no acute distress and continues to be using phone.

## 2019-02-26 NOTE — ED Provider Notes (Signed)
[place] DEPT Provider Note   CSN: [id number] Arrival date & time: 02/26/19  1225     History   Chief Complaint Chief Complaint  Patient presents with  . Vaginal Bleeding  . Rectal Pain    HPI Katrina Boyer is a 68 y.o. female.     HPI   Saturday began to see blood in brief, staying at [facility] Today woke up and blood clots in brief Gone through menopause years ago, was quite a shock, nurse at [facility] thought it was vaginal Hx of Crohn's diase, supposed to have GI surgery soon, seeing GI at [facility] Had hemorrhoid surgery years ago and has them again Does feel like has a UTI, having dysuria for several weeks  Rectal pain, has been constant since Sat Has frequent loose stools due to hx of crohns but no current diarrhea/constipation. No known fevers, no n/v.  Does report some abdominal pain LLQ.   Cannot lok to see if she has had blood in stool but none mixed in that she knows of. Not sure if having hematuria.    Past Medical History:  Diagnosis Date  . Bacteremia   . Cognitive communication disorder   . Coordination abnormal   . Crohn disease ([place])   . Muscle weakness (generalized)   . Unsteadiness on feet     Patient Active Problem List   Diagnosis Date Noted  . MRSA bacteremia 01/25/2019  . Bacteremia 01/24/2019  . Crohn's disease ([place]) 01/24/2019    History reviewed. No pertinent surgical history.   OB History   No obstetric history on file.      Home Medications    Prior to Admission medications   Medication Sig Start Date End Date Taking? Authorizing Provider  acetaminophen (TYLENOL) 325 MG tablet Take 650 mg by mouth every 4 (four) hours as needed for mild pain.   Yes [provider]  albuterol (PROVENTIL) (2.5 MG/3ML) 0.083% nebulizer solution Take 2.5 mg by nebulization every 4 (four) hours as needed for wheezing or shortness of breath.   Yes [provider]  Amino Acids-Protein Hydrolys  (FEEDING SUPPLEMENT, PRO-STAT SUGAR FREE 64,) LIQD Take 30 mLs by mouth daily. Patient taking differently: Take 30 mLs by mouth 3 (three) times daily.  02/01/19  Yes Swayze, Ava, DO  buPROPion (WELLBUTRIN XL) 300 MG 24 hr tablet Take 300 mg by mouth daily.   Yes [provider]  escitalopram (LEXAPRO) 5 MG tablet Take 5 mg by mouth daily.   Yes [provider]  hyoscyamine (LEVSIN SL) 0.125 MG SL tablet Place 0.125 mg under the tongue every 4 (four) hours as needed for cramping.   Yes [provider]  Infant Care Products [facility] EX) Apply 1 application topically 2 (two) times daily.   Yes [provider]  lidocaine (LIDODERM) 5 % Place 1 patch onto the skin daily. Remove & Discard patch within 12 hours or as directed by MD   Yes [provider]  loperamide (IMODIUM) 2 MG capsule Take 4 mg by mouth 4 (four) times daily as needed for diarrhea or loose stools.   Yes [provider]  Melatonin 3 MG TABS Take 3 mg by mouth at bedtime.   Yes [provider]  Menthol-Methyl Salicylate (MUSCLE RUB) 10-15 % CREA Apply 1 application topically 3 (three) times daily as needed for muscle pain. 02/01/19  Yes Swayze, Ava, DO  methocarbamol (ROBAXIN) 500 MG tablet Take 1 tablet (500 mg total) by mouth every 6 (  six) hours as needed for muscle spasms. 02/01/19  Yes Swayze, Ava, DO  Multiple Vitamins-Minerals (CEROVITE PO) Take 1 tablet by mouth daily.   Yes [provider]  nystatin (NYSTATIN) powder Apply 1 Bottle topically 2 (two) times daily. 02/12/19 02/27/19 Yes [provider]  ondansetron (ZOFRAN) 4 MG tablet Take 1 tablet (4 mg total) by mouth every 6 (six) hours as needed for nausea. 02/01/19  Yes Swayze, Ava, DO  oxyCODONE (OXY IR/ROXICODONE) 5 MG immediate release tablet Take 1 tablet (5 mg total) by mouth every 4 (four) hours as needed for moderate pain. Patient taking differently: Take 10 mg by mouth every 4 (four) hours  as needed for moderate pain.  02/01/19  Yes Swayze, Ava, DO  pantoprazole (PROTONIX) 40 MG tablet Take 40 mg by mouth daily.   Yes [provider]  pravastatin (PRAVACHOL) 20 MG tablet Take 20 mg by mouth daily.   Yes [provider]  saccharomyces boulardii (FLORASTOR) 250 MG capsule Take 250 mg by mouth 2 (two) times daily.   Yes [provider]  vancomycin (VANCOCIN) 50 mg/mL oral solution Take 2.5 mLs (125 mg total) by mouth 4 (four) times daily. 02/01/19  Yes Swayze, Ava, DO  zolpidem (AMBIEN) 5 MG tablet Take 1 tablet (5 mg total) by mouth at bedtime as needed for sleep. 02/01/19  Yes Swayze, Ava, DO  cephALEXin (KEFLEX) 500 MG capsule Take 1 capsule (500 mg total) by mouth 2 (two) times daily for 14 days. 02/26/19 03/12/19  Gareth Morgan, MD  liver oil-zinc oxide (DESITIN) 40 % ointment Apply topically 2 (two) times daily as needed for irritation. Patient not taking: Reported on 02/26/2019 02/01/19   Swayze, Ava, DO  metroNIDAZOLE (FLAGYL) 500 MG tablet Take 1 tablet (500 mg total) by mouth 2 (two) times daily for 14 days. 02/26/19 03/12/19  Gareth Morgan, MD  Multiple Vitamin (MULTIVITAMIN WITH MINERALS) TABS tablet Take 1 tablet by mouth daily. Patient not taking: Reported on 02/26/2019 02/01/19   Swayze, Ava, DO    Family History History reviewed. No pertinent family history.  Social History Social History   Tobacco Use  . Smoking status: Former Research scientist (life sciences)  . Smokeless tobacco: Never Used  Substance Use Topics  . Alcohol use: Not Currently  . Drug use: Never     Allergies   Piperacillin-tazobactam in dex, Metronidazole, Gabapentin, Mercaptopurine, and Ciprofloxacin   Review of Systems Review of Systems  Constitutional: Negative for fever.  HENT: Negative for congestion.   Eyes: Negative for visual disturbance.  Respiratory: Negative for shortness of breath.   Cardiovascular: Negative for chest pain.  Gastrointestinal: Positive for  abdominal pain, nausea and rectal pain (pressure and feels like needs to push). Negative for constipation, diarrhea (was having loose bowels) and vomiting.  Genitourinary: Positive for dysuria and vaginal bleeding.  Musculoskeletal: Negative for back pain.  Skin: Negative for rash.  Neurological: Negative for light-headedness and headaches.     Physical Exam Updated Vital Signs BP 112/68   Pulse 93   Temp 98.3 F (36.8 C) (Oral)   Resp 17   Ht 5' 4"  (1.626 m)   Wt 44 kg   SpO2 99%   BMI 16.65 kg/m   Physical Exam Vitals signs and nursing note reviewed. Exam conducted with a chaperone present.  Constitutional:      General: She is not in acute distress.    Appearance: She is well-developed. She is not diaphoretic.  HENT:     Head: Normocephalic and atraumatic.  Eyes:     Conjunctiva/sclera: Conjunctivae normal.  Neck:     Musculoskeletal: Normal range of motion.  Cardiovascular:     Rate and Rhythm: Normal rate and regular rhythm.  Pulmonary:     Effort: Pulmonary effort is normal. No respiratory distress.  Abdominal:     General: There is no distension.     Palpations: Abdomen is soft.     Tenderness: There is abdominal tenderness (left lower). There is no guarding.  Genitourinary:    Labia:        Right: Rash present.        Left: Rash present.      Cervix: Cervical motion tenderness and cervical bleeding (blood present at os, in vagina, no active hemorrhage) present.     Uterus: Tender.      Adnexa:        Right: Tenderness present.        Left: Tenderness present.      Rectum: Guaiac stool: brown stool. Tenderness and external hemorrhoid present.  Musculoskeletal:        General: No tenderness.  Skin:    General: Skin is warm and dry.     Findings: No erythema or rash.  Neurological:     Mental Status: She is alert and oriented to person, place, and time.      ED Treatments / Results  Labs (all labs ordered are listed, but only abnormal results are  displayed) Labs Reviewed  WET PREP, GENITAL - Abnormal; Notable for the following components:      Result Value   Yeast Wet Prep HPF POC PRESENT (*)    Clue Cells Wet Prep HPF POC PRESENT (*)    WBC, Wet Prep HPF POC MANY (*)    All other components within normal limits  CBC WITH DIFFERENTIAL/PLATELET - Abnormal; Notable for the following components:   WBC 11.8 (*)    RBC 3.33 (*)    Hemoglobin 9.5 (*)    HCT 30.1 (*)    RDW 17.1 (*)    Neutro Abs 8.5 (*)    All other components within normal limits  COMPREHENSIVE METABOLIC PANEL - Abnormal; Notable for the following components:   Sodium 131 (*)    Glucose, Bld 113 (*)    BUN 56 (*)    Creatinine, Ser 1.29 (*)    Calcium 10.9 (*)    Albumin 2.6 (*)    Alkaline Phosphatase 146 (*)    GFR calc non Af Amer 43 (*)    GFR calc Af Amer 50 (*)    All other components within normal limits  URINALYSIS, ROUTINE W REFLEX MICROSCOPIC - Abnormal; Notable for the following components:   Color, Urine STRAW (*)    APPearance TURBID (*)    Hgb urine dipstick LARGE (*)    Ketones, ur 5 (*)    Protein, ur 100 (*)    Nitrite POSITIVE (*)    Leukocytes,Ua TRACE (*)    All other components within normal limits  URINE CULTURE  GC/CHLAMYDIA PROBE AMP (Garza-Salinas II) NOT AT [facility]    EKG None  Radiology Ct Abdomen Pelvis W Contrast  Result Date: 02/26/2019 CLINICAL DATA:  Abdominal pain EXAM: CT ABDOMEN AND PELVIS WITH CONTRAST TECHNIQUE: Multidetector CT imaging of the abdomen and pelvis was performed using the standard protocol following bolus administration of intravenous contrast. CONTRAST:  48m OMNIPAQUE IOHEXOL 300 MG/ML  SOLN COMPARISON:  None. FINDINGS: Lower chest: There is bronchiectasis with tree-in-bud nodular densities in the  left lower lobe and lingula. No effusions. Hepatobiliary: No focal hepatic abnormality.  Prior cholecystectomy Pancreas: No focal abnormality or ductal dilatation. Spleen: No focal abnormality.  Normal size.  Adrenals/Urinary Tract: Large cyst off the posterior left kidney measures 9 cm. Multiple small cysts in the right kidney. There is bilateral collecting system and ureteral wall enhancement. There appears to be enhancement in the bladder wall. This could reflect infection. No overt hydronephrosis. Nonobstructing stones in the right kidney. Abnormal density noted centrally within the urinary bladder. This could reflect blood clot, less likely bladder wall mass. Stomach/Bowel: Abnormal appearance of the sigmoid colon. There is sigmoid diverticulosis. There is wall thickening and surrounding stranding within the mid sigmoid colon. A locule of gas superior to the sigmoid colon could be extraluminal or within a diverticulum. While the findings could be related to diverticulosis and diverticulitis, cannot exclude annular neoplasm/cancer. This warrants follow-up. No evidence of bowel obstruction. Vascular/Lymphatic: Heavily calcified aorta. No evidence of aneurysm or adenopathy. Reproductive: Uterus and adnexa unremarkable.  No mass. Other: No free fluid or free air. Musculoskeletal: No acute bony abnormality. IMPRESSION: Abnormal appearance of the sigmoid colon. There are scattered diverticula. There is wall thickening and stranding surrounding the mid sigmoid colon with possible extraluminal gas versus gas within a diverticulum. While findings could be related to diverticulosis and diverticulitis, annular neoplasm cannot be excluded. Recommend correlation with any recent colonoscopy is. If there has not been a recent colonoscopy, 1 should be considered. Abnormal appearance of the renal collecting system, ureters and bladder wall which appear to enhance. This may be related to urinary tract infection. Abnormal density centrally within the bladder, favor blood clot although bladder wall mass cannot be completely excluded. Right nephrolithiasis.  No hydronephrosis. Aortic atherosclerosis. Bronchiectasis with tree-in-bud  densities in the lingula and left lower lobe, likely related to prior infection. Electronically Signed   By: Rolm Baptise M.D.   On: 02/26/2019 20:12    Procedures Procedures (including critical care time)  Medications Ordered in ED Medications  ondansetron (ZOFRAN) injection 4 mg (4 mg Intravenous Given 02/26/19 1817)  morphine 4 MG/ML injection 4 mg (4 mg Intravenous Given 02/26/19 1817)  fluconazole (DIFLUCAN) tablet 150 mg (150 mg Oral Given 02/26/19 2012)  cefTRIAXone (ROCEPHIN) 1 g in sodium chloride 0.9 % 100 mL IVPB (0 g Intravenous Stopped 02/26/19 2054)  iohexol (OMNIPAQUE) 300 MG/ML solution 75 mL (75 mLs Intravenous Contrast Given 02/26/19 1947)     Initial Impression / Assessment and Plan / ED Course  I have reviewed the triage vital signs and the nursing notes.  Pertinent labs & imaging results that were available during my care of the patient were reviewed by me and considered in my medical decision making (see chart for details).        Katrina Boyer is a pleasant 68yo female with history of recent MRSA bacteremia due to PICC in October, Crohn's disease with fistulas, perianal infolvement on TPN followed by [facility] gastroenterology, generalized weakness who presents with concern for suspected vaginal bleeding.  Speculum exam does show vaginal blood without active hemorrhage, no lesions noted, although pt also seen urinating in ED and appeared to have hematuria as well.   Given abdominal pain and tenderness, CT abdomen pelvis completed which shows several abnormalities, possible diverticulitis versus malignancy, enhancement of urinary system with possible blood clot or other in bladder. Low suspicion for perforation by clinical hx and discussion with radiology. UA does appear consistent with UTI and given symptoms suspect UTI, and  will treat for diverticulitis. Given keflex and flagyl rx. (reports she can likely take flagyl-had prior neuropathy with it, unclear contraindication.)     Recommend continued follow up with her [facility] GI physician given abnormal CT as well as known complicated Crohn's disease.  Recommend Urology follow up for possible bladder mass vs blood clot.  Recommend GYN follow up for postmenopausal vaginal bleeding.  Given po rx, recommend specialist follow up, discussed reasons to return. Patient discharged in stable condition with understanding of reasons to return.   Final Clinical Impressions(s) / ED Diagnoses   Final diagnoses:  Diverticulitis  Vaginal bleeding  Urinary tract infection with hematuria, site unspecified    ED Discharge Orders         Ordered    cephALEXin (KEFLEX) 500 MG capsule  2 times daily     02/26/19 2053    metroNIDAZOLE (FLAGYL) 500 MG tablet  2 times daily     02/26/19 2053           Gareth Morgan, MD 02/27/19 1342

## 2019-02-27 LAB — URINE CULTURE

## 2019-02-28 LAB — GC/CHLAMYDIA PROBE AMP (~~LOC~~) NOT AT ARMC
Chlamydia: NEGATIVE
Neisseria Gonorrhea: NEGATIVE

## 2019-04-11 ENCOUNTER — Encounter (HOSPITAL_COMMUNITY): Payer: Self-pay

## 2019-04-11 ENCOUNTER — Other Ambulatory Visit: Payer: Self-pay

## 2019-04-11 DIAGNOSIS — Z888 Allergy status to other drugs, medicaments and biological substances status: Secondary | ICD-10-CM

## 2019-04-11 DIAGNOSIS — D509 Iron deficiency anemia, unspecified: Secondary | ICD-10-CM | POA: Diagnosis present

## 2019-04-11 DIAGNOSIS — R159 Full incontinence of feces: Secondary | ICD-10-CM | POA: Diagnosis present

## 2019-04-11 DIAGNOSIS — U071 COVID-19: Secondary | ICD-10-CM | POA: Diagnosis present

## 2019-04-11 DIAGNOSIS — Z880 Allergy status to penicillin: Secondary | ICD-10-CM

## 2019-04-11 DIAGNOSIS — R64 Cachexia: Secondary | ICD-10-CM | POA: Diagnosis present

## 2019-04-11 DIAGNOSIS — N1832 Chronic kidney disease, stage 3b: Secondary | ICD-10-CM | POA: Diagnosis present

## 2019-04-11 DIAGNOSIS — T827XXA Infection and inflammatory reaction due to other cardiac and vascular devices, implants and grafts, initial encounter: Principal | ICD-10-CM | POA: Diagnosis present

## 2019-04-11 DIAGNOSIS — E43 Unspecified severe protein-calorie malnutrition: Secondary | ICD-10-CM | POA: Diagnosis present

## 2019-04-11 DIAGNOSIS — E872 Acidosis: Secondary | ICD-10-CM | POA: Diagnosis present

## 2019-04-11 DIAGNOSIS — R7881 Bacteremia: Secondary | ICD-10-CM | POA: Diagnosis not present

## 2019-04-11 DIAGNOSIS — Z87891 Personal history of nicotine dependence: Secondary | ICD-10-CM

## 2019-04-11 DIAGNOSIS — E871 Hypo-osmolality and hyponatremia: Secondary | ICD-10-CM | POA: Diagnosis not present

## 2019-04-11 DIAGNOSIS — Z681 Body mass index (BMI) 19 or less, adult: Secondary | ICD-10-CM

## 2019-04-11 DIAGNOSIS — K5090 Crohn's disease, unspecified, without complications: Secondary | ICD-10-CM | POA: Diagnosis present

## 2019-04-11 DIAGNOSIS — D638 Anemia in other chronic diseases classified elsewhere: Secondary | ICD-10-CM | POA: Diagnosis present

## 2019-04-11 DIAGNOSIS — Z993 Dependence on wheelchair: Secondary | ICD-10-CM

## 2019-04-11 DIAGNOSIS — Z881 Allergy status to other antibiotic agents status: Secondary | ICD-10-CM

## 2019-04-11 DIAGNOSIS — Z1623 Resistance to quinolones and fluoroquinolones: Secondary | ICD-10-CM | POA: Diagnosis present

## 2019-04-11 DIAGNOSIS — B957 Other staphylococcus as the cause of diseases classified elsewhere: Secondary | ICD-10-CM | POA: Diagnosis present

## 2019-04-11 DIAGNOSIS — Y828 Other medical devices associated with adverse incidents: Secondary | ICD-10-CM | POA: Diagnosis present

## 2019-04-11 MED ORDER — SODIUM CHLORIDE 0.9% FLUSH
3.0000 mL | Freq: Once | INTRAVENOUS | Status: AC
  Administered 2019-04-12: 3 mL via INTRAVENOUS

## 2019-04-11 NOTE — ED Triage Notes (Signed)
Pt BIB [facility] from [facility]. She was sent after bloodwork showed gram positive cocci in her L arm. They also report that her PICC line in her R arm is infected. Pt has very limited mobility.

## 2019-04-12 ENCOUNTER — Other Ambulatory Visit: Payer: Self-pay

## 2019-04-12 ENCOUNTER — Emergency Department (HOSPITAL_COMMUNITY): Payer: Medicare Other

## 2019-04-12 ENCOUNTER — Inpatient Hospital Stay (HOSPITAL_COMMUNITY)
Admission: EM | Admit: 2019-04-12 | Discharge: 2019-04-18 | DRG: 314 | Disposition: A | Discharge status: Skilled Nursing Facility | Payer: Medicare Other | Source: Skilled Nursing Facility | Attending: Internal Medicine | Admitting: Internal Medicine

## 2019-04-12 DIAGNOSIS — E43 Unspecified severe protein-calorie malnutrition: Secondary | ICD-10-CM | POA: Diagnosis present

## 2019-04-12 DIAGNOSIS — E872 Acidosis: Secondary | ICD-10-CM | POA: Diagnosis present

## 2019-04-12 DIAGNOSIS — K50113 Crohn's disease of large intestine with fistula: Secondary | ICD-10-CM | POA: Diagnosis not present

## 2019-04-12 DIAGNOSIS — U071 COVID-19: Secondary | ICD-10-CM | POA: Diagnosis present

## 2019-04-12 DIAGNOSIS — R7881 Bacteremia: Secondary | ICD-10-CM

## 2019-04-12 DIAGNOSIS — E871 Hypo-osmolality and hyponatremia: Secondary | ICD-10-CM | POA: Diagnosis not present

## 2019-04-12 DIAGNOSIS — R159 Full incontinence of feces: Secondary | ICD-10-CM | POA: Diagnosis present

## 2019-04-12 DIAGNOSIS — R64 Cachexia: Secondary | ICD-10-CM | POA: Diagnosis present

## 2019-04-12 DIAGNOSIS — B957 Other staphylococcus as the cause of diseases classified elsewhere: Secondary | ICD-10-CM | POA: Diagnosis present

## 2019-04-12 DIAGNOSIS — K5090 Crohn's disease, unspecified, without complications: Secondary | ICD-10-CM | POA: Diagnosis present

## 2019-04-12 DIAGNOSIS — Z993 Dependence on wheelchair: Secondary | ICD-10-CM | POA: Diagnosis not present

## 2019-04-12 DIAGNOSIS — D509 Iron deficiency anemia, unspecified: Secondary | ICD-10-CM | POA: Diagnosis present

## 2019-04-12 DIAGNOSIS — N1832 Chronic kidney disease, stage 3b: Secondary | ICD-10-CM | POA: Diagnosis present

## 2019-04-12 DIAGNOSIS — D638 Anemia in other chronic diseases classified elsewhere: Secondary | ICD-10-CM | POA: Diagnosis present

## 2019-04-12 DIAGNOSIS — Z1623 Resistance to quinolones and fluoroquinolones: Secondary | ICD-10-CM | POA: Diagnosis present

## 2019-04-12 DIAGNOSIS — Z880 Allergy status to penicillin: Secondary | ICD-10-CM | POA: Diagnosis not present

## 2019-04-12 DIAGNOSIS — K50119 Crohn's disease of large intestine with unspecified complications: Secondary | ICD-10-CM | POA: Diagnosis not present

## 2019-04-12 DIAGNOSIS — Z87891 Personal history of nicotine dependence: Secondary | ICD-10-CM | POA: Diagnosis not present

## 2019-04-12 DIAGNOSIS — T827XXA Infection and inflammatory reaction due to other cardiac and vascular devices, implants and grafts, initial encounter: Secondary | ICD-10-CM | POA: Diagnosis present

## 2019-04-12 DIAGNOSIS — Z681 Body mass index (BMI) 19 or less, adult: Secondary | ICD-10-CM | POA: Diagnosis not present

## 2019-04-12 DIAGNOSIS — L8990 Pressure ulcer of unspecified site, unspecified stage: Secondary | ICD-10-CM | POA: Insufficient documentation

## 2019-04-12 DIAGNOSIS — Z888 Allergy status to other drugs, medicaments and biological substances status: Secondary | ICD-10-CM | POA: Diagnosis not present

## 2019-04-12 DIAGNOSIS — Y828 Other medical devices associated with adverse incidents: Secondary | ICD-10-CM | POA: Diagnosis present

## 2019-04-12 DIAGNOSIS — Z881 Allergy status to other antibiotic agents status: Secondary | ICD-10-CM | POA: Diagnosis not present

## 2019-04-12 LAB — COMPREHENSIVE METABOLIC PANEL
ALT: 20 U/L (ref 0–44)
AST: 28 U/L (ref 15–41)
Albumin: 2.5 g/dL — ABNORMAL LOW (ref 3.5–5.0)
Alkaline Phosphatase: 140 U/L — ABNORMAL HIGH (ref 38–126)
Anion gap: 9 (ref 5–15)
BUN: 33 mg/dL — ABNORMAL HIGH (ref 8–23)
CO2: 25 mmol/L (ref 22–32)
Calcium: 9.3 mg/dL (ref 8.9–10.3)
Chloride: 97 mmol/L — ABNORMAL LOW (ref 98–111)
Creatinine, Ser: 1.17 mg/dL — ABNORMAL HIGH (ref 0.44–1.00)
GFR calc Af Amer: 56 mL/min — ABNORMAL LOW (ref >60)
GFR calc non Af Amer: 49 mL/min — ABNORMAL LOW (ref >60)
Glucose, Bld: 91 mg/dL (ref 70–99)
Potassium: 4.6 mmol/L (ref 3.5–5.1)
Sodium: 131 mmol/L — ABNORMAL LOW (ref 135–145)
Total Bilirubin: 0.5 mg/dL (ref 0.3–1.2)
Total Protein: 7.6 g/dL (ref 6.5–8.1)

## 2019-04-12 LAB — CBC WITH DIFFERENTIAL/PLATELET
Abs Immature Granulocytes: 0.05 10*3/uL (ref 0.00–0.07)
Basophils Absolute: 0 10*3/uL (ref 0.0–0.1)
Basophils Relative: 0 %
Eosinophils Absolute: 0.1 10*3/uL (ref 0.0–0.5)
Eosinophils Relative: 1 %
HCT: 26.9 % — ABNORMAL LOW (ref 36.0–46.0)
Hemoglobin: 8.3 g/dL — ABNORMAL LOW (ref 12.0–15.0)
Immature Granulocytes: 1 %
Lymphocytes Relative: 32 %
Lymphs Abs: 2 10*3/uL (ref 0.7–4.0)
MCH: 29.4 pg (ref 26.0–34.0)
MCHC: 30.9 g/dL (ref 30.0–36.0)
MCV: 95.4 fL (ref 80.0–100.0)
Monocytes Absolute: 0.4 10*3/uL (ref 0.1–1.0)
Monocytes Relative: 7 %
Neutro Abs: 3.6 10*3/uL (ref 1.7–7.7)
Neutrophils Relative %: 59 %
Platelets: 238 10*3/uL (ref 150–400)
RBC: 2.82 MIL/uL — ABNORMAL LOW (ref 3.87–5.11)
RDW: 15.8 % — ABNORMAL HIGH (ref 11.5–15.5)
WBC: 6.1 10*3/uL (ref 4.0–10.5)
nRBC: 0 % (ref 0.0–0.2)

## 2019-04-12 LAB — LACTIC ACID, PLASMA: Lactic Acid, Venous: 1 mmol/L (ref 0.5–1.9)

## 2019-04-12 LAB — SARS CORONAVIRUS 2 (TAT 6-24 HRS): SARS Coronavirus 2: POSITIVE — AB

## 2019-04-12 LAB — MRSA PCR SCREENING: MRSA by PCR: POSITIVE — AB

## 2019-04-12 MED ORDER — ENOXAPARIN SODIUM 40 MG/0.4ML ~~LOC~~ SOLN: 40.0000 mg | SUBCUTANEOUS | Status: DC

## 2019-04-12 MED ORDER — VANCOMYCIN HCL IN DEXTROSE 1-5 GM/200ML-% IV SOLN
1000.0000 mg | Freq: Once | INTRAVENOUS | Status: AC
  Administered 2019-04-12: 1000 mg via INTRAVENOUS
  Filled 2019-04-12: qty 200

## 2019-04-12 MED ORDER — ACETAMINOPHEN 325 MG PO TABS: 650.0000 mg | ORAL_TABLET | ORAL | Status: DC | PRN

## 2019-04-12 MED ORDER — SODIUM CHLORIDE 0.9 % IV SOLN: INTRAVENOUS | Status: DC

## 2019-04-12 MED ORDER — LIP MEDEX EX OINT
TOPICAL_OINTMENT | CUTANEOUS | Status: AC
  Filled 2019-04-12: qty 7

## 2019-04-12 MED ORDER — VANCOMYCIN HCL 10 G IV SOLR: 20.0000 mg/kg | Freq: Once | INTRAVENOUS | Status: DC

## 2019-04-12 MED ORDER — VANCOMYCIN HCL 500 MG/100ML IV SOLN
500.0000 mg | INTRAVENOUS | Status: DC
  Administered 2019-04-13 – 2019-04-17 (×6): 500 mg via INTRAVENOUS
  Filled 2019-04-12 (×7): qty 100

## 2019-04-12 MED ORDER — LOPERAMIDE HCL 2 MG PO CAPS
4.0000 mg | ORAL_CAPSULE | Freq: Four times a day (QID) | ORAL | Status: DC | PRN
  Administered 2019-04-16 (×2): 4 mg via ORAL
  Filled 2019-04-12 (×2): qty 2

## 2019-04-12 MED ORDER — METHOCARBAMOL 500 MG PO TABS: 500.0000 mg | ORAL_TABLET | Freq: Four times a day (QID) | ORAL | Status: DC | PRN

## 2019-04-12 MED ORDER — BUPROPION HCL ER (XL) 300 MG PO TB24
300.0000 mg | ORAL_TABLET | Freq: Every day | ORAL | Status: DC
  Administered 2019-04-12 – 2019-04-18 (×7): 300 mg via ORAL
  Filled 2019-04-12 (×7): qty 1

## 2019-04-12 MED ORDER — ESCITALOPRAM OXALATE 10 MG PO TABS
5.0000 mg | ORAL_TABLET | Freq: Every day | ORAL | Status: DC
  Administered 2019-04-12 – 2019-04-18 (×7): 5 mg via ORAL
  Filled 2019-04-12 (×7): qty 1

## 2019-04-12 MED ORDER — OXYCODONE HCL 5 MG PO TABS
5.0000 mg | ORAL_TABLET | ORAL | Status: DC | PRN
  Administered 2019-04-12 – 2019-04-17 (×6): 5 mg via ORAL
  Filled 2019-04-12 (×6): qty 1

## 2019-04-12 MED ORDER — ALBUTEROL SULFATE (2.5 MG/3ML) 0.083% IN NEBU: 2.5000 mg | INHALATION_SOLUTION | RESPIRATORY_TRACT | Status: DC | PRN

## 2019-04-12 MED ORDER — SODIUM CHLORIDE 0.9% FLUSH: 10.0000 mL | INTRAVENOUS | Status: DC | PRN

## 2019-04-12 MED ORDER — PRAVASTATIN SODIUM 20 MG PO TABS
20.0000 mg | ORAL_TABLET | Freq: Every day | ORAL | Status: DC
  Administered 2019-04-12 – 2019-04-18 (×7): 20 mg via ORAL
  Filled 2019-04-12 (×7): qty 1

## 2019-04-12 MED ORDER — ADULT MULTIVITAMIN W/MINERALS CH
1.0000 | ORAL_TABLET | Freq: Every day | ORAL | Status: DC
  Administered 2019-04-12 – 2019-04-18 (×7): 1 via ORAL
  Filled 2019-04-12 (×7): qty 1

## 2019-04-12 MED ORDER — HYOSCYAMINE SULFATE 0.125 MG SL SUBL
0.1250 mg | SUBLINGUAL_TABLET | SUBLINGUAL | Status: DC | PRN
  Filled 2019-04-12: qty 1

## 2019-04-12 MED ORDER — MELATONIN 3 MG PO TABS
3.0000 mg | ORAL_TABLET | Freq: Every day | ORAL | Status: DC
  Filled 2019-04-12 (×3): qty 1

## 2019-04-12 MED ORDER — ZOLPIDEM TARTRATE 5 MG PO TABS
5.0000 mg | ORAL_TABLET | Freq: Every evening | ORAL | Status: DC | PRN
  Administered 2019-04-13 – 2019-04-17 (×5): 5 mg via ORAL
  Filled 2019-04-12 (×5): qty 1

## 2019-04-12 MED ORDER — SACCHAROMYCES BOULARDII 250 MG PO CAPS
250.0000 mg | ORAL_CAPSULE | Freq: Two times a day (BID) | ORAL | Status: DC
  Administered 2019-04-12 – 2019-04-18 (×12): 250 mg via ORAL
  Filled 2019-04-12 (×12): qty 1

## 2019-04-12 MED ORDER — PANTOPRAZOLE SODIUM 40 MG PO TBEC
40.0000 mg | DELAYED_RELEASE_TABLET | Freq: Every day | ORAL | Status: DC
  Administered 2019-04-12 – 2019-04-18 (×7): 40 mg via ORAL
  Filled 2019-04-12 (×7): qty 1

## 2019-04-12 MED ORDER — CHLORHEXIDINE GLUCONATE CLOTH 2 % EX PADS
6.0000 | MEDICATED_PAD | Freq: Every day | CUTANEOUS | Status: DC
  Administered 2019-04-13 – 2019-04-16 (×4): 6 via TOPICAL

## 2019-04-12 NOTE — ED Notes (Signed)
Hospitalist at bedside 

## 2019-04-12 NOTE — ED Provider Notes (Signed)
Emergency Department Provider Note   I have reviewed the triage vital signs and the nursing notes.   HISTORY  Chief Complaint Infection (Gram + Cocci)   HPI Katrina Boyer is a 68 y.o. female who is currently at a rehabilitation facility secondary to significant malnutrition from Crohn's disease who presents to the emergency department today with bacteremia.  Patient states that they took blood cultures yesterday  and she was found to have gram-positive cocci in both cultures.  She has a PICC line for TPN.  She has no symptoms of headache, neck pain, respiratory symptoms, nausea or vomiting, diarrhea or other concerns.  Her facility also states that they are concerned she might have a PICC line infection but it is unclear why they think that.  No other associated or modifying symptoms.    Past Medical History:  Diagnosis Date  . Bacteremia   . Cognitive communication disorder   . Coordination abnormal   . Crohn disease ([place])   . Muscle weakness (generalized)   . Unsteadiness on feet     Patient Active Problem List   Diagnosis Date Noted  . Blood bacterial culture positive 04/12/2019  . MRSA bacteremia 01/25/2019  . Bacteremia 01/24/2019  . Crohn's disease ([place]) 01/24/2019    History reviewed. No pertinent surgical history.  Current Outpatient Rx  . Order #: [id number] Class: Historical Med  . Order #: [id number] Class: Historical Med  . Order #: [id number] Class: Normal  . Order #: [id number] Class: Historical Med  . Order #: [id number] Class: Historical Med  . Order #: [id number] Class: Historical Med  . Order #: [id number] Class: Historical Med  . Order #: [id number] Class: Historical Med  . Order #: [id number] Class: Historical Med  . Order #: [id number] Class: Historical Med  . Order #: [id number] Class: Normal  . Order #: [id number] Class: Historical Med  . Order #: [id number] Class: Print  . Order #: [id number] Class: Historical Med  . Order #: [id number] Class: Normal  . Order #:  [id number] Class: Print  . Order #: [id number] Class: Historical Med  . Order #: [id number] Class: Historical Med  . Order #: [id number] Class: Historical Med  . Order #: [id number] Class: Historical Med  . Order #: [id number] Class: Print  . Order #: [id number] Class: Print  . Order #: [id number] Class: Normal  . Order #: [id number] Class: Print    Allergies Piperacillin-tazobactam in dex, Metronidazole, Gabapentin, Mercaptopurine, and Ciprofloxacin  History reviewed. No pertinent family history.  Social History Social History   Tobacco Use  . Smoking status: Former Research scientist (life sciences)  . Smokeless tobacco: Never Used  Substance Use Topics  . Alcohol use: Not Currently  . Drug use: Never    Review of Systems  All other systems negative except as documented in the HPI. All pertinent positives and negatives as reviewed in the HPI. ____________________________________________   PHYSICAL EXAM:  VITAL SIGNS: ED Triage Vitals  Enc Vitals Group     BP 04/11/19 1937 (!) 112/59     Pulse Rate 04/11/19 1937 86     Resp 04/11/19 1937 16     Temp 04/11/19 1937 98.7 F (37.1 C)     Temp Source 04/11/19 1937 Oral     SpO2 04/11/19 1937 95 %    Constitutional: Alert and oriented. Well appearing and in no acute distress. Eyes: Conjunctivae are normal. PERRL. EOMI. Head: Atraumatic. Nose: No congestion/rhinnorhea. Mouth/Throat: Mucous membranes are moist.  Oropharynx non-erythematous. Neck: No stridor.  No meningeal signs.   Cardiovascular: Normal rate, regular rhythm. Good peripheral circulation. Grossly normal heart sounds.   Respiratory: Normal respiratory effort.  No retractions. Lungs CTAB. Gastrointestinal: Soft and nontender. No distention.  Musculoskeletal: No lower extremity tenderness nor edema. No gross deformities of extremities. Neurologic:  Normal speech and language. No gross focal neurologic deficits are appreciated.  Skin:  Skin is warm, dry and intact. No rash  noted.   ____________________________________________   LABS (all labs ordered are listed, but only abnormal results are displayed)  Labs Reviewed  COMPREHENSIVE METABOLIC PANEL - Abnormal; Notable for the following components:      Result Value   Sodium 131 (*)    Chloride 97 (*)    BUN 33 (*)    Creatinine, Ser 1.17 (*)    Albumin 2.5 (*)    Alkaline Phosphatase 140 (*)    GFR calc non Af Amer 49 (*)    GFR calc Af Amer 56 (*)    All other components within normal limits  CBC WITH DIFFERENTIAL/PLATELET - Abnormal; Notable for the following components:   RBC 2.82 (*)    Hemoglobin 8.3 (*)    HCT 26.9 (*)    RDW 15.8 (*)    All other components within normal limits  CULTURE, BLOOD (ROUTINE X 2)  CULTURE, BLOOD (ROUTINE X 2)  CULTURE, BLOOD (SINGLE)  SARS CORONAVIRUS 2 (TAT 6-24 HRS)  LACTIC ACID, PLASMA  URINALYSIS, ROUTINE W REFLEX MICROSCOPIC  CBC  CREATININE, SERUM   ____________________________________________  RADIOLOGY  DG Chest 2 View  Result Date: 04/12/2019 CLINICAL DATA:  Evaluate for infection. Gram positive cocci. EXAM: CHEST - 2 VIEW COMPARISON:  Radiograph 01/24/2019 FINDINGS: Right upper extremity central line tip in the lower SVC. Unchanged heart size and mediastinal contours. No evidence of pneumonia. Minimal retrocardiac and right lower lung zone atelectasis. No pulmonary edema, pleural effusion, or pneumothorax. Mid to lower thoracic compression deformity. Degenerative changes of both shoulders. IMPRESSION: 1. No focal airspace disease or evidence of pneumonia. Minimal retrocardiac and right lower lung zone atelectasis. 2. Right upper extremity central line tip in the lower SVC. Electronically Signed   By: Keith Rake M.D.   On: 04/12/2019 03:17    ____________________________________________   INITIAL IMPRESSION / ASSESSMENT AND PLAN / ED COURSE  Gram-positive cocci in 2 out of 2 peripheral cultures at a facility yesterday.  PICC line in  place.  Will draw 2 peripheral cultures and one culture from the line.  Will await labs and admit to the hospital.  We will give her 1 dose of antibiotics here pending cultures.  Workup as above. D/w Dr. Humphrey Rolls, will admit.   Pertinent labs & imaging results that were available during my care of the patient were reviewed by me and considered in my medical decision making (see chart for details).   ____________________________________________  FINAL CLINICAL IMPRESSION(S) / ED DIAGNOSES  Final diagnoses:  Blood bacterial culture positive     MEDICATIONS GIVEN DURING THIS VISIT:  Medications  enoxaparin (LOVENOX) injection 40 mg (has no administration in time range)  sodium chloride flush (NS) 0.9 % injection 3 mL (3 mLs Intravenous Given 04/12/19 0124)  vancomycin (VANCOCIN) IVPB 1000 mg/200 mL premix (0 mg Intravenous Stopped 04/12/19 0223)     NEW OUTPATIENT MEDICATIONS STARTED DURING THIS VISIT:  New Prescriptions   No medications on file    Note:  This note was prepared with assistance of Dragon voice recognition software. Occasional wrong-word or sound-a-like substitutions may have occurred due to the inherent limitations of voice recognition software.   Ephram Kornegay, Corene Cornea, MD 04/12/19 [phone]

## 2019-04-12 NOTE — H&P (Signed)
History and Physical    Katrina Boyer OVF:[id number] DOB: [birth year] DOA: 04/12/2019  PCP: Seward Carol, MD   Patient coming from: [facility]  I have personally briefly reviewed patient's old medical records in [place]  Chief Complaint: Evaluation of chronic positive cocci cultures and facility  HPI: Katrina Boyer is a 68 y.o. 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  Patient states that her blood cultures were drawn yesterday and the results came back positive for gram-positive cocci in both cultures.  Patient denies fever, chills, headache, sore throat, cough, loss of taste and smell sensation, chest pain, shortness of breath, nausea, vomiting, abdominal pain and urinary symptoms.  There is no redness or pain in the area around the PICC line.  ED Course: In the ED on arrival patient had a temperature of 98.7, blood pressure 113/60, heart rate 76 and respiratory rate 18.  Oxygen saturation is 99% on room air.  CBC showed normal WBC count of 6.1.  No other lab abnormality found.  Chest x-ray is also negative for acute cardiopulmonary pathology.  2 blood cultures are drawn from different site and patient is given a dose of vancomycin.  Review of Systems: As per HPI otherwise 10 point review of systems negative.    Past Medical History:  Diagnosis Date  . Bacteremia   . Cognitive communication disorder   . Coordination abnormal   . Crohn disease ([place])   . Muscle weakness (generalized)   . Unsteadiness on feet     History reviewed. No pertinent surgical history.   reports that she has quit smoking. She has never used smokeless tobacco. She reports previous alcohol use. She reports that she does not use drugs.  Allergies  Allergen Reactions  . Piperacillin-Tazobactam In Dex       Thrombocytopenia   . Metronidazole     neuropathy  . Gabapentin   . Mercaptopurine Nausea Only  . Ciprofloxacin Rash    History reviewed. No pertinent family history.  Unacceptable: Noncontributory, unremarkable, or negative. Acceptable: (example)Family history negative for heart disease  Prior to Admission medications   Medication Sig Start Date End Date Taking? Authorizing Provider  acetaminophen (TYLENOL) 325 MG tablet Take 650 mg by mouth every 4 (four) hours as needed for mild pain.   Yes [provider]  albuterol (PROVENTIL) (2.5 MG/3ML) 0.083% nebulizer solution Take 2.5 mg by nebulization every 4 (four) hours as needed for wheezing or shortness of breath.   Yes [provider]  Amino Acids-Protein Hydrolys (FEEDING SUPPLEMENT, PRO-STAT SUGAR FREE 64,) LIQD Take 30 mLs by mouth daily. 02/01/19  Yes Swayze, Ava, DO  buPROPion (WELLBUTRIN XL) 300 MG 24 hr tablet Take 300 mg by mouth daily.   Yes [provider]  escitalopram (LEXAPRO) 5 MG tablet Take 5 mg by mouth daily.   Yes [provider]  hyoscyamine (LEVSIN SL) 0.125 MG SL tablet Place 0.125 mg under the tongue every 4 (four) hours as needed for cramping.   Yes [provider]  Infant Care Products [facility] EX) Apply 1 application topically 2 (two) times daily.   Yes [provider]  lidocaine (LIDODERM) 5 % Place 1 patch onto the skin daily. Remove & Discard patch within 12 hours or as directed by MD   Yes [provider]  loperamide (IMODIUM) 2 MG  capsule Take 4 mg by mouth 4 (four) times daily as needed for diarrhea or loose stools.   Yes [provider]  Melatonin 3 MG TABS Take 3 mg by mouth at bedtime.   Yes [provider]  Menthol-Methyl Salicylate (MUSCLE RUB) 10-15 % CREA Apply 1 application topically 3 (three) times daily as needed for muscle pain. 02/01/19  Yes Swayze, Ava, DO  Menthol-Zinc Oxide (CALMOSEPTINE EX) Apply 1  application topically 2 (two) times daily. Apply to groin/perineum   Yes [provider]  methocarbamol (ROBAXIN) 500 MG tablet Take 1 tablet (500 mg total) by mouth every 6 (six) hours as needed for muscle spasms. 02/01/19  Yes Swayze, Ava, DO  Multiple Vitamins-Minerals (CEROVITE PO) Take 1 tablet by mouth daily.   Yes [provider]  ondansetron (ZOFRAN) 4 MG tablet Take 1 tablet (4 mg total) by mouth every 6 (six) hours as needed for nausea. 02/01/19  Yes Swayze, Ava, DO  oxyCODONE (OXY IR/ROXICODONE) 5 MG immediate release tablet Take 1 tablet (5 mg total) by mouth every 4 (four) hours as needed for moderate pain. Patient taking differently: Take 10 mg by mouth every 4 (four) hours as needed for moderate pain.  02/01/19  Yes Swayze, Ava, DO  pantoprazole (PROTONIX) 40 MG tablet Take 40 mg by mouth daily.   Yes [provider]  pravastatin (PRAVACHOL) 20 MG tablet Take 20 mg by mouth daily.   Yes [provider]  PRESCRIPTION MEDICATION PER MAR: Clinimix TPN IV with multivitamin additive 10 mL/1100 mL/16 Hours infusion via CADD pump, add 10 MVI to each bag daily. New bag to be started daily at 12 pm, stop at 4 am   Yes [provider]  saccharomyces boulardii (FLORASTOR) 250 MG capsule Take 250 mg by mouth 2 (two) times daily.   Yes [provider]  vancomycin (VANCOCIN) 50 mg/mL oral solution Take 2.5 mLs (125 mg total) by mouth 4 (four) times daily. 02/01/19  Yes Swayze, Ava, DO  zolpidem (AMBIEN) 5 MG tablet Take 1 tablet (5 mg total) by mouth at bedtime as needed for sleep. 02/01/19  Yes Swayze, Ava, DO  liver oil-zinc oxide (DESITIN) 40 % ointment Apply topically 2 (two) times daily as needed for irritation. Patient not taking: Reported on 02/26/2019 02/01/19   Swayze, Ava, DO  Multiple Vitamin (MULTIVITAMIN WITH MINERALS) TABS tablet Take 1 tablet by mouth daily. Patient not taking: Reported on 02/26/2019 02/01/19   Karie Kirks, DO     Physical Exam: Vitals:   04/12/19 0052 04/12/19 0130 04/12/19 0200 04/12/19 0230  BP: 96/71 107/70 107/67 113/60  Pulse: 80 78 78 76  Resp: 18   18  Temp: 98.7 F (37.1 C)     TempSrc: Oral     SpO2: 98% 95% 98% 99%    Constitutional: NAD, calm, comfortable Vitals:   04/12/19 0052 04/12/19 0130 04/12/19 0200 04/12/19 0230  BP: 96/71 107/70 107/67 113/60  Pulse: 80 78 78 76  Resp: 18   18  Temp: 98.7 F (37.1 C)     TempSrc: Oral     SpO2: 98% 95% 98% 99%   Eyes: PERRL, lids and conjunctivae normal ENMT: Mucous membranes are moist. Posterior pharynx clear of any exudate or lesions.Normal dentition.  Neck: normal, supple, no masses, no thyromegaly Respiratory: clear to auscultation bilaterally, no wheezing, no crackles. Normal respiratory effort. No accessory muscle use.  Cardiovascular: Regular rate and rhythm, no murmurs / rubs / gallops. No extremity edema. 2+  pedal pulses. No carotid bruits.  Abdomen: no tenderness, no masses palpated. No hepatosplenomegaly. Bowel sounds positive.  Musculoskeletal: no clubbing / cyanosis. No joint deformity upper and lower extremities. Good ROM, no contractures. Normal muscle tone.  Patient has muscular wasting in both upper and lower extremities.  PICC line in right upper extremity with no signs of infection around the PICC line. Skin: no rashes, lesions, ulcers. No induration Neurologic: CN 2-12 grossly intact. Sensation intact, DTR normal. Strength 5/5 in all 4.  Psychiatric: Normal judgment and insight. Alert and oriented x 3. Normal mood.   Labs on Admission: I have personally reviewed following labs and imaging studies  CBC: Recent Labs  Lab 04/12/19 0101  WBC 6.1  NEUTROABS 3.6  HGB 8.3*  HCT 26.9*  MCV 95.4  PLT 401   Basic Metabolic Panel: Recent Labs  Lab 04/12/19 0101  NA 131*  K 4.6  CL 97*  CO2 25  GLUCOSE 91  BUN 33*  CREATININE 1.17*  CALCIUM 9.3   GFR: CrCl cannot be calculated (Unknown ideal  weight.). Liver Function Tests: Recent Labs  Lab 04/12/19 0101  AST 28  ALT 20  ALKPHOS 140*  BILITOT 0.5  PROT 7.6  ALBUMIN 2.5*   No results for input(s): LIPASE, AMYLASE in the last 168 hours. No results for input(s): AMMONIA in the last 168 hours. Coagulation Profile: No results for input(s): INR, PROTIME in the last 168 hours. Cardiac Enzymes: No results for input(s): CKTOTAL, CKMB, CKMBINDEX, TROPONINI in the last 168 hours. BNP (last 3 results) No results for input(s): PROBNP in the last 8760 hours. HbA1C: No results for input(s): HGBA1C in the last 72 hours. CBG: No results for input(s): GLUCAP in the last 168 hours. Lipid Profile: No results for input(s): CHOL, HDL, LDLCALC, TRIG, CHOLHDL, LDLDIRECT in the last 72 hours. Thyroid Function Tests: No results for input(s): TSH, T4TOTAL, FREET4, T3FREE, THYROIDAB in the last 72 hours. Anemia Panel: No results for input(s): VITAMINB12, FOLATE, FERRITIN, TIBC, IRON, RETICCTPCT in the last 72 hours. Urine analysis:    Component Value Date/Time   COLORURINE STRAW (A) 02/26/2019 1807   APPEARANCEUR TURBID (A) 02/26/2019 1807   LABSPEC 1.011 02/26/2019 1807   PHURINE 6.0 02/26/2019 1807   GLUCOSEU NEGATIVE 02/26/2019 1807   HGBUR LARGE (A) 02/26/2019 1807   BILIRUBINUR NEGATIVE 02/26/2019 1807   KETONESUR 5 (A) 02/26/2019 1807   PROTEINUR 100 (A) 02/26/2019 1807   NITRITE POSITIVE (A) 02/26/2019 1807   LEUKOCYTESUR TRACE (A) 02/26/2019 1807    Radiological Exams on Admission: DG Chest 2 View  Result Date: 04/12/2019 CLINICAL DATA:  Evaluate for infection. Gram positive cocci. EXAM: CHEST - 2 VIEW COMPARISON:  Radiograph 01/24/2019 FINDINGS: Right upper extremity central line tip in the lower SVC. Unchanged heart size and mediastinal contours. No evidence of pneumonia. Minimal retrocardiac and right lower lung zone atelectasis. No pulmonary edema, pleural effusion, or pneumothorax. Mid to lower thoracic compression  deformity. Degenerative changes of both shoulders. IMPRESSION: 1. No focal airspace disease or evidence of pneumonia. Minimal retrocardiac and right lower lung zone atelectasis. 2. Right upper extremity central line tip in the lower SVC. Electronically Signed   By: Keith Rake M.D.   On: 04/12/2019 03:17      Assessment/Plan Active Problems:    Gram-positive cocci bacteremia Patient had 2 positive blood cultures for gram-positive cocci in the nursing facility. 2.cultures from different sites ordered Patient is given 1 dose of IV vancomycin. Continue to monitor   Crohn's  disease Patient is on TPN and site of PICC line is not infected.     DVT prophylaxis: Lovenox Code Status: Full code Family Communication:  Disposition Plan: Patient will be discharged back to skilled nursing facility later today Consults called: None Admission status: Observation/MedSurg   Edmonia Lynch MD [facility]   If 7PM-7AM, please contact night-coverage [email or web address]   04/12/2019, 3:21 AM

## 2019-04-12 NOTE — ED Notes (Signed)
Unable to draw back blood from PICC despite flushing line and repositioning right arm. Notified Dr. Lauretta Grill of this information.

## 2019-04-12 NOTE — Progress Notes (Signed)
Pharmacy Antibiotic Note  Katrina Boyer is a 68 y.o. female admitted on 04/12/2019 with GPC in blood from cx at SNF, organism not yet identified.  Pharmacy has been consulted for Vancomycin dosing, also resulted Covid +, PICC in place-no sign infection  Plan: Vanc 1gm x1, then 557m q24, AUC 498, SCr 1.17, TBW  Height: 5' 4.02" (162.6 cm) Weight: 97 lb (44 kg) IBW/kg (Calculated) : 54.74  Temp (24hrs), Avg:98.7 F (37.1 C), Min:98.2 F (36.8 C), Max:99.2 F (37.3 C)  Recent Labs  Lab 04/11/19 2136 04/12/19 0101  WBC  --  6.1  CREATININE  --  1.17*  LATICACIDVEN 1.0  --     Estimated Creatinine Clearance: 32.9 mL/min (A) (by C-G formula based on SCr of 1.17 mg/dL (H)).    Allergies  Allergen Reactions  . Piperacillin-Tazobactam In Dex     Thrombocytopenia   . Metronidazole     neuropathy  . Gabapentin   . Mercaptopurine Nausea Only  . Ciprofloxacin Rash    Antimicrobials this admission: 12/31 Vanc >>   Dose adjustments this admission:  Microbiology results: 12/30 Bcx: gpc - reported from outside facility 12/31 BCx: pending 12/31 MRSA PCR: positive 12/31 COVID: positive  Thank you for allowing pharmacy to be a part of this patient's care.  GMinda DittoPharmD 04/12/2019 3:38 PM

## 2019-04-12 NOTE — Progress Notes (Addendum)
Patient seen and examined, admitted earlier this morning by Dr. Earlie Server, please see his H&P for details, briefly this is a 67 year old female with history of severe chronic Crohn's disease with fistulas, multiple surgeries, colectomy, end ileostomy,  on TPN, recurrent C. difficile colitis, anxiety, history of line infections was sent from [facility] overnight with reports of positive blood cultures, ED triage note says gram-positive cocci in her left arm, she is bed/wheelchair-bound at baseline  Bacteremia -Verbal report of gram-positive cocci per triage nurse, this needs to be confirmed, we have called her facility multiple times today requesting information via telephone or fax, still pending at this time -After we confirmed bacteremia and not contamination will remove her PICC line, start IV vancomycin -Cultures drawn earlier this morning in the ER are negative thus far  History of recurrent C. difficile colitis -Has completed multiple rounds of oral vancomycin with prolonged tapers, denies worsening of her chronic diarrhea  Severe protein calorie malnutrition -TPN on hold, remove PICC line today if report confirms bacteremia  Severe Crohn's disease with fistulous -Followed by GI and general surgery at [facility] for further surgeries at some point, follow-up with them in stable  Anemia of chronic disease and iron deficiency -Stable, monitor  Domenic Polite, MD

## 2019-04-12 NOTE — ED Notes (Signed)
Katrina Boyer, NT provided patient a coke to drink.

## 2019-04-12 NOTE — Progress Notes (Signed)
RN received call from microbiology for positive Covid result. MD paged to request order for transfer to COVID positive unit. Awaiting orders.

## 2019-04-12 NOTE — ED Notes (Signed)
Provider at bedside

## 2019-04-12 NOTE — Progress Notes (Signed)
RN attempted to call [facility] to request copy of patient medical records (recent blood cultures) per MD order. Awaiting call back.

## 2019-04-12 NOTE — ED Notes (Signed)
Patient transported to radiology

## 2019-04-13 LAB — COMPREHENSIVE METABOLIC PANEL
ALT: 25 U/L (ref 0–44)
AST: 36 U/L (ref 15–41)
Albumin: 2.3 g/dL — ABNORMAL LOW (ref 3.5–5.0)
Alkaline Phosphatase: 136 U/L — ABNORMAL HIGH (ref 38–126)
Anion gap: 9 (ref 5–15)
BUN: 27 mg/dL — ABNORMAL HIGH (ref 8–23)
CO2: 23 mmol/L (ref 22–32)
Calcium: 9 mg/dL (ref 8.9–10.3)
Chloride: 101 mmol/L (ref 98–111)
Creatinine, Ser: 1.22 mg/dL — ABNORMAL HIGH (ref 0.44–1.00)
GFR calc Af Amer: 53 mL/min — ABNORMAL LOW (ref >60)
GFR calc non Af Amer: 46 mL/min — ABNORMAL LOW (ref >60)
Glucose, Bld: 92 mg/dL (ref 70–99)
Potassium: 4.1 mmol/L (ref 3.5–5.1)
Sodium: 133 mmol/L — ABNORMAL LOW (ref 135–145)
Total Bilirubin: 0.5 mg/dL (ref 0.3–1.2)
Total Protein: 7.4 g/dL (ref 6.5–8.1)

## 2019-04-13 LAB — CBC
HCT: 25.2 % — ABNORMAL LOW (ref 36.0–46.0)
Hemoglobin: 7.8 g/dL — ABNORMAL LOW (ref 12.0–15.0)
MCH: 29.3 pg (ref 26.0–34.0)
MCHC: 31 g/dL (ref 30.0–36.0)
MCV: 94.7 fL (ref 80.0–100.0)
Platelets: 215 10*3/uL (ref 150–400)
RBC: 2.66 MIL/uL — ABNORMAL LOW (ref 3.87–5.11)
RDW: 15.4 % (ref 11.5–15.5)
WBC: 5.3 10*3/uL (ref 4.0–10.5)
nRBC: 0 % (ref 0.0–0.2)

## 2019-04-13 LAB — C-REACTIVE PROTEIN: CRP: 7.7 mg/dL — ABNORMAL HIGH (ref <1.0)

## 2019-04-13 LAB — D-DIMER, QUANTITATIVE: D-Dimer, Quant: 1.19 ug/mL-FEU — ABNORMAL HIGH (ref 0.00–0.50)

## 2019-04-13 LAB — FERRITIN: Ferritin: 735 ng/mL — ABNORMAL HIGH (ref 11–307)

## 2019-04-13 MED ORDER — ENOXAPARIN SODIUM 30 MG/0.3ML ~~LOC~~ SOLN
30.0000 mg | SUBCUTANEOUS | Status: DC
  Administered 2019-04-13 – 2019-04-17 (×5): 30 mg via SUBCUTANEOUS
  Filled 2019-04-13 (×5): qty 0.3

## 2019-04-13 MED ORDER — ZINC OXIDE 40 % EX OINT
TOPICAL_OINTMENT | CUTANEOUS | Status: DC | PRN
  Filled 2019-04-13 (×3): qty 57

## 2019-04-13 NOTE — Progress Notes (Addendum)
PROGRESS NOTE    Katrina Boyer  SEG:[id number] DOB: 04/08/1951 DOA: 04/12/2019 PCP: Seward Carol, MD  Brief Narrative: 69 year old female with history of severe chronic Crohn's disease with fistulas, multiple surgeries, colectomy, end ileostomy,  on TPN, recurrent C. difficile colitis, anxiety, history of line infections was sent from [facility] overnight with reports of positive blood cultures, ED triage note says gram-positive cocci in her left arm, she is bed/wheelchair-bound at baseline  Assessment & Plan:   Staph epidermidis bacteremia -Obtained blood culture report from [facility] clinical lab -they report that both sets of blood cultures with all 4 bottles were positive for staph epidermidis, this was sensitive to vancomycin, linezolid, tigecycline, tetracycline and rifampin, intermediate to moxifloxacin and resistant to ciprofloxacin and oxacillin  -Called and discussed with infectious disease Dr. Drucilla Schmidt today, recommended removal of PICC line, treatment with IV vancomycin for 7 days, day 1 will be tomorrow from the date following line removal -Will need line holiday and negative cultures for at least 48 hours before replacing PICC  Covid Positive -incidental, positive PCR -she is completely asymptomatic from this, no hypoxemia, chest x-ray is clear, afebrile -CRP and Ferritin mildly elevated, will consider Remdesivir if this worsens or develops symptoms  History of recurrent C. difficile colitis -Has completed multiple rounds of oral vancomycin with prolonged tapers, denies worsening of her chronic diarrhea  Severe protein calorie malnutrition -will resume TPN, awaiting Cx data from SNF  Severe Crohn's disease with fistulous -Followed by GI and general surgery at [facility] for further surgeries at some point, follow-up with them in stable  Anemia of chronic disease and iron deficiency -Stable, monitor  DVT prophylaxis: add lovenox Code Status: FUll Code Family  Communication: none at bedside Disposition Plan: Home pending info on cultures  Consultants:    Procedures:   Antimicrobials:    Subjective: -has chronic bowel incontinence -denies any cough, dyspnea, no N/V  Objective: Vitals:   04/12/19 1514 04/12/19 1711 04/12/19 2327 04/13/19 0535  BP: 120/64 128/69 120/66 115/66  Pulse: 80 75 79 75  Resp: 16 18 18 16   Temp: 98.2 F (36.8 C)  98.7 F (37.1 C) 98.8 F (37.1 C)  TempSrc: Oral  Oral Oral  SpO2: 100% 98% 97% 96%  Weight:      Height:        Intake/Output Summary (Last 24 hours) at 04/13/2019 1330 Last data filed at 04/13/2019 1000 Gross per 24 hour  Intake 2572.85 ml  Output -  Net 7371.85 ml   Filed Weights   04/12/19 0824  Weight: 44 kg    Examination:  Gen: Cachectic chronically ill-appearing female, sitting up in bed, AAOx3 HEENT: PERRLA, Neck supple, no JVD Lungs: Clear CVS: RRR,No Gallops,Rubs or new Murmurs Abd: Soft scaphoid, nontender, multiple surgical scars, bowel sounds present  extremities: No edema Skin: no new rashes Psychiatry:  Mood & affect appropriate.     Data Reviewed:   CBC: Recent Labs  Lab 04/12/19 0101 04/13/19 0345  WBC 6.1 5.3  NEUTROABS 3.6  --   HGB 8.3* 7.8*  HCT 26.9* 25.2*  MCV 95.4 94.7  PLT 238 062   Basic Metabolic Panel: Recent Labs  Lab 04/12/19 0101 04/13/19 0345  NA 131* 133*  K 4.6 4.1  CL 97* 101  CO2 25 23  GLUCOSE 91 92  BUN 33* 27*  CREATININE 1.17* 1.22*  CALCIUM 9.3 9.0   GFR: Estimated Creatinine Clearance: 31.5 mL/min (A) (by C-G formula based on SCr of 1.22 mg/dL (H)).  Liver Function Tests: Recent Labs  Lab 04/12/19 0101 04/13/19 0345  AST 28 36  ALT 20 25  ALKPHOS 140* 136*  BILITOT 0.5 0.5  PROT 7.6 7.4  ALBUMIN 2.5* 2.3*   No results for input(s): LIPASE, AMYLASE in the last 168 hours. No results for input(s): AMMONIA in the last 168 hours. Coagulation Profile: No results for input(s): INR, PROTIME in the last 168  hours. Cardiac Enzymes: No results for input(s): CKTOTAL, CKMB, CKMBINDEX, TROPONINI in the last 168 hours. BNP (last 3 results) No results for input(s): PROBNP in the last 8760 hours. HbA1C: No results for input(s): HGBA1C in the last 72 hours. CBG: No results for input(s): GLUCAP in the last 168 hours. Lipid Profile: No results for input(s): CHOL, HDL, LDLCALC, TRIG, CHOLHDL, LDLDIRECT in the last 72 hours. Thyroid Function Tests: No results for input(s): TSH, T4TOTAL, FREET4, T3FREE, THYROIDAB in the last 72 hours. Anemia Panel: Recent Labs    04/13/19 0345  FERRITIN 735*   Urine analysis:    Component Value Date/Time   COLORURINE STRAW (A) 02/26/2019 1807   APPEARANCEUR TURBID (A) 02/26/2019 1807   LABSPEC 1.011 02/26/2019 1807   PHURINE 6.0 02/26/2019 1807   GLUCOSEU NEGATIVE 02/26/2019 1807   HGBUR LARGE (A) 02/26/2019 1807   BILIRUBINUR NEGATIVE 02/26/2019 1807   KETONESUR 5 (A) 02/26/2019 1807   PROTEINUR 100 (A) 02/26/2019 1807   NITRITE POSITIVE (A) 02/26/2019 1807   LEUKOCYTESUR TRACE (A) 02/26/2019 1807   Sepsis Labs: @LABRCNTIP (procalcitonin:4,lacticidven:4)  ) Recent Results (from the past 240 hour(s))  Blood culture (routine x 2)     Status: None (Preliminary result)   Collection Time: 04/12/19  1:01 AM   Specimen: BLOOD  Result Value Ref Range Status   Specimen Description   Final    BLOOD BLOOD RIGHT FOREARM Performed at [facility], [place] [address], [place], [place] [address]    Special Requests   Final    BOTTLES DRAWN AEROBIC AND ANAEROBIC Blood Culture adequate volume Performed at [facility] [address], [place], [place] [address]    Culture   Final    NO GROWTH 1 DAY Performed at [facility], [place] [address] [place], [place] [address]    Report Status PENDING  Incomplete  Blood culture (routine x 2)     Status: None (Preliminary result)   Collection Time: 04/12/19  1:10 AM   Specimen:  BLOOD  Result Value Ref Range Status   Specimen Description   Final    BLOOD BLOOD LEFT FOREARM Performed at [facility] [address], [place], [place] [address]    Special Requests   Final    BOTTLES DRAWN AEROBIC AND ANAEROBIC Blood Culture adequate volume Performed at [facility] [address], [place], [place] [address]    Culture   Final    NO GROWTH 1 DAY Performed at [facility], [place] [address] [place], [place] [address]    Report Status PENDING  Incomplete  SARS CORONAVIRUS 2 (TAT 6-24 HRS) Nasopharyngeal Nasopharyngeal Swab     Status: Abnormal   Collection Time: 04/12/19  2:45 AM   Specimen: Nasopharyngeal Swab  Result Value Ref Range Status   SARS Coronavirus 2 POSITIVE (A) NEGATIVE Final    Comment: RESULT CALLED TO, READ BACK BY AND VERIFIED WITH: Zack Seal RN 15:30 04/12/19 ([email or web address]) (NOTE) SARS-CoV-2 target nucleic acids are DETECTED. The SARS-CoV-2 RNA is generally detectable in upper and  lower respiratory specimens during the acute phase of infection. Positive results are indicative of the presence of SARS-CoV-2 RNA. Clinical correlation with patient history and other diagnostic information is  necessary to determine patient infection status. Positive results do not rule out bacterial infection or co-infection with other viruses.  The expected result is Negative. Fact Sheet for Patients: SugarRoll.be Fact Sheet for Healthcare Providers: https://www.woods-mathews.com/ This test is not yet approved or cleared by the [place] FDA and  has been authorized for detection and/or diagnosis of SARS-CoV-2 by FDA under an Emergency Use Authorization (EUA). This EUA will remain  in effect (meaning this test can be used) for t he duration of the COVID-19 declaration under Section 564(b)(1) of the Act, 21 U.S.C. section 360bbb-3(b)(1), unless the authorization is terminated or  revoked sooner. Performed at [facility], [place] [address], [place], [place] [address]   MRSA PCR Screening     Status: Abnormal   Collection Time: 04/12/19  4:38 AM   Specimen: Nasal Mucosa; Nasopharyngeal  Result Value Ref Range Status   MRSA by PCR POSITIVE (A) NEGATIVE Final    Comment:        The GeneXpert MRSA Assay (FDA approved for NASAL specimens only), is one component of a comprehensive MRSA colonization surveillance program. It is not intended to diagnose MRSA infection nor to guide or monitor treatment for MRSA infections. RESULT CALLED TO, READ BACK BY AND VERIFIED WITH: Zack Seal RN 9:35 04/12/19 ([email or web address]) Performed at [facility], [place] [address], [place], [place] [address]          Radiology Studies: DG Chest 2 View  Result Date: 04/12/2019 CLINICAL DATA:  Evaluate for infection. Gram positive cocci. EXAM: CHEST - 2 VIEW COMPARISON:  Radiograph 01/24/2019 FINDINGS: Right upper extremity central line tip in the lower SVC. Unchanged heart size and mediastinal contours. No evidence of pneumonia. Minimal retrocardiac and right lower lung zone atelectasis. No pulmonary edema, pleural effusion, or pneumothorax. Mid to lower thoracic compression deformity. Degenerative changes of both shoulders. IMPRESSION: 1. No focal airspace disease or evidence of pneumonia. Minimal retrocardiac and right lower lung zone atelectasis. 2. Right upper extremity central line tip in the lower SVC. Electronically Signed   By: Keith Rake M.D.   On: 04/12/2019 03:17        Scheduled Meds: . buPROPion  300 mg Oral Daily  . Chlorhexidine Gluconate Cloth  6 each Topical Daily  . escitalopram  5 mg Oral Daily  . Melatonin  3 mg Oral QHS  . multivitamin with minerals  1 tablet Oral Daily  . pantoprazole  40 mg Oral Daily  . pravastatin  20 mg Oral Daily  . saccharomyces boulardii  250 mg Oral BID   Continuous Infusions: . sodium chloride 75 mL/hr at 04/12/19 1028   . vancomycin 500 mg (04/13/19 0121)     LOS: 1 day    Time spent: 68mn  PDomenic Polite MD [facility]  04/13/2019, 1:30 PM

## 2019-04-14 LAB — BASIC METABOLIC PANEL
Anion gap: 12 (ref 5–15)
BUN: 25 mg/dL — ABNORMAL HIGH (ref 8–23)
CO2: 19 mmol/L — ABNORMAL LOW (ref 22–32)
Calcium: 8.6 mg/dL — ABNORMAL LOW (ref 8.9–10.3)
Chloride: 100 mmol/L (ref 98–111)
Creatinine, Ser: 1.28 mg/dL — ABNORMAL HIGH (ref 0.44–1.00)
GFR calc Af Amer: 50 mL/min — ABNORMAL LOW (ref >60)
GFR calc non Af Amer: 44 mL/min — ABNORMAL LOW (ref >60)
Glucose, Bld: 83 mg/dL (ref 70–99)
Potassium: 3.9 mmol/L (ref 3.5–5.1)
Sodium: 131 mmol/L — ABNORMAL LOW (ref 135–145)

## 2019-04-14 LAB — CBC
HCT: 26.6 % — ABNORMAL LOW (ref 36.0–46.0)
Hemoglobin: 8.1 g/dL — ABNORMAL LOW (ref 12.0–15.0)
MCH: 28.9 pg (ref 26.0–34.0)
MCHC: 30.5 g/dL (ref 30.0–36.0)
MCV: 95 fL (ref 80.0–100.0)
Platelets: 221 10*3/uL (ref 150–400)
RBC: 2.8 MIL/uL — ABNORMAL LOW (ref 3.87–5.11)
RDW: 15.2 % (ref 11.5–15.5)
WBC: 5.4 10*3/uL (ref 4.0–10.5)
nRBC: 0 % (ref 0.0–0.2)

## 2019-04-14 NOTE — Progress Notes (Signed)
PROGRESS NOTE    Van Ehlert  ZOX:[id number] DOB: 16-Jan-1951 DOA: 04/12/2019 PCP: Seward Carol, MD  Brief Narrative: 69 year old female with history of severe chronic Crohn's disease with fistulas, multiple surgeries, colectomy, end ileostomy,  on TPN, recurrent C. difficile colitis, anxiety, history of line infections was sent from [facility] overnight with reports of positive blood cultures, ED triage note says gram-positive cocci in her left arm, she is bed/wheelchair-bound at baseline  Assessment & Plan:   Staph epidermidis bacteremia/line infection -Patient is sent from [facility] with report of gram-positive cocci in clusters in her blood  -I called and obtained blood culture report from [facility] clinical lab -they report that both sets of blood cultures with all 4 bottles were positive for staph epidermidis, this was sensitive to vancomycin, linezolid, tigecycline, tetracycline and rifampin, intermediate to moxifloxacin and resistant to ciprofloxacin and oxacillin  -Called and discussed with infectious disease Dr. Tommy Medal yesterday, he recommended removal of PICC line, treatment with IV vancomycin for 7 days, day 1 will be today 1/1 from the date following line removal -PICC line was removed yesterday 1/1, blood cultures repeated this morning 1/2 -Okay to replace PICC line if cultures negative for 48 hours  Covid Positive -incidental, positive PCR -she is completely asymptomatic from this, no hypoxemia, chest x-ray is clear, afebrile -CRP and Ferritin mildly elevated this is likely from her bacteremia, continue isolation and monitor at this time  History of recurrent C. difficile colitis -Has completed multiple rounds of oral vancomycin with prolonged tapers, denies worsening of her chronic diarrhea  Severe protein calorie malnutrition -Resume TPN after PICC line replaced in 48 hours if cultures negative  Severe Crohn's disease with fistulous -Followed by GI and  general surgery at [place] for further surgeries at some point, follow-up with them in stable -Stable  Anemia of chronic disease and iron deficiency -Stable, monitor  DVT prophylaxis: lovenox Code Status: FUll Code Family Communication: none at bedside Disposition Plan: Home pending info on cultures  Consultants:    Procedures: PICC line removed 1/1  Antimicrobials:    Subjective: -No complaints, has some chronic bowel incontinence issues -Tolerating diet, appetite is not the greatest, high PICC line was removed yesterday  Objective: Vitals:   04/12/19 2327 04/13/19 0535 04/13/19 2039 04/14/19 0622  BP: 120/66 115/66 138/83 118/62  Pulse: 79 75 77 82  Resp: 18 16 18 18   Temp: 98.7 F (37.1 C) 98.8 F (37.1 C) 99.5 F (37.5 C) 98.8 F (37.1 C)  TempSrc: Oral Oral Oral Oral  SpO2: 97% 96% 99% 97%  Weight:      Height:        Intake/Output Summary (Last 24 hours) at 04/14/2019 1256 Last data filed at 04/14/2019 1000 Gross per 24 hour  Intake 2274.98 ml  Output --  Net 2274.98 ml   Filed Weights   04/12/19 0824  Weight: 44 kg    Examination:  Gen: Cachectic, chronically ill-appearing female, sitting up in bed, AAOx3 HEENT: PERRLA, Neck supple, no JVD Lungs: Clear CVS: RRR,No Gallops,Rubs or new Murmurs Abd: Soft, scaphoid, nontender, multiple surgical scars, bowel sounds present Extremities: No edema, PICC line removed Skin: no new rashes Psychiatry:  Mood & affect appropriate.     Data Reviewed:   CBC: Recent Labs  Lab 04/12/19 0101 04/13/19 0345 04/14/19 0219  WBC 6.1 5.3 5.4  NEUTROABS 3.6  --   --   HGB 8.3* 7.8* 8.1*  HCT 26.9* 25.2* 26.6*  MCV 95.4 94.7 95.0  PLT 238 215 502   Basic Metabolic Panel: Recent Labs  Lab 04/12/19 0101 04/13/19 0345 04/14/19 0219  NA 131* 133* 131*  K 4.6 4.1 3.9  CL 97* 101 100  CO2 25 23 19*  GLUCOSE 91 92 83  BUN 33* 27* 25*  CREATININE 1.17* 1.22* 1.28*  CALCIUM 9.3 9.0 8.6*    GFR: Estimated Creatinine Clearance: 30 mL/min (A) (by C-G formula based on SCr of 1.28 mg/dL (H)). Liver Function Tests: Recent Labs  Lab 04/12/19 0101 04/13/19 0345  AST 28 36  ALT 20 25  ALKPHOS 140* 136*  BILITOT 0.5 0.5  PROT 7.6 7.4  ALBUMIN 2.5* 2.3*   No results for input(s): LIPASE, AMYLASE in the last 168 hours. No results for input(s): AMMONIA in the last 168 hours. Coagulation Profile: No results for input(s): INR, PROTIME in the last 168 hours. Cardiac Enzymes: No results for input(s): CKTOTAL, CKMB, CKMBINDEX, TROPONINI in the last 168 hours. BNP (last 3 results) No results for input(s): PROBNP in the last 8760 hours. HbA1C: No results for input(s): HGBA1C in the last 72 hours. CBG: No results for input(s): GLUCAP in the last 168 hours. Lipid Profile: No results for input(s): CHOL, HDL, LDLCALC, TRIG, CHOLHDL, LDLDIRECT in the last 72 hours. Thyroid Function Tests: No results for input(s): TSH, T4TOTAL, FREET4, T3FREE, THYROIDAB in the last 72 hours. Anemia Panel: Recent Labs    04/13/19 0345  FERRITIN 735*   Urine analysis:    Component Value Date/Time   COLORURINE STRAW (A) 02/26/2019 1807   APPEARANCEUR TURBID (A) 02/26/2019 1807   LABSPEC 1.011 02/26/2019 1807   PHURINE 6.0 02/26/2019 1807   GLUCOSEU NEGATIVE 02/26/2019 1807   HGBUR LARGE (A) 02/26/2019 1807   BILIRUBINUR NEGATIVE 02/26/2019 1807   KETONESUR 5 (A) 02/26/2019 1807   PROTEINUR 100 (A) 02/26/2019 1807   NITRITE POSITIVE (A) 02/26/2019 1807   LEUKOCYTESUR TRACE (A) 02/26/2019 1807   Sepsis Labs: @LABRCNTIP (procalcitonin:4,lacticidven:4)  ) Recent Results (from the past 240 hour(s))  Blood culture (routine x 2)     Status: None (Preliminary result)   Collection Time: 04/12/19  1:01 AM   Specimen: BLOOD  Result Value Ref Range Status   Specimen Description   Final    BLOOD BLOOD RIGHT FOREARM Performed at [facility], [place] [address], [place],  [place] [address]    Special Requests   Final    BOTTLES DRAWN AEROBIC AND ANAEROBIC Blood Culture adequate volume Performed at [facility] [address] [place], [place] [address]    Culture   Final    NO GROWTH 2 DAYS Performed at [place] [address] [place], [place] [address]    Report Status PENDING  Incomplete  Blood culture (routine x 2)     Status: None (Preliminary result)   Collection Time: 04/12/19  1:10 AM   Specimen: BLOOD  Result Value Ref Range Status   Specimen Description   Final    BLOOD BLOOD LEFT FOREARM Performed at [place] [address] [place], [place] [address]    Special Requests   Final    BOTTLES DRAWN AEROBIC AND ANAEROBIC Blood Culture adequate volume Performed at [place] [address], [place], [place] [address]    Culture   Final    NO GROWTH 2 DAYS Performed at [facility] [address], [place],  [address]    Report Status PENDING  Incomplete  SARS CORONAVIRUS 2 (TAT 6-24 HRS) Nasopharyngeal Nasopharyngeal Swab     Status: Abnormal   Collection Time: 04/12/19  2:45 AM   Specimen: Nasopharyngeal Swab  Result Value Ref Range Status   SARS Coronavirus 2 POSITIVE (A) NEGATIVE Final    Comment: RESULT CALLED TO, READ BACK BY AND VERIFIED WITH: Zack Seal RN 15:30 04/12/19 ([email or web address]) (NOTE) SARS-CoV-2 target nucleic acids are DETECTED. The SARS-CoV-2 RNA is generally detectable in upper and lower respiratory specimens during the acute phase of infection. Positive results are indicative of the presence of SARS-CoV-2 RNA. Clinical correlation with patient history and other diagnostic information is  necessary to determine patient infection status. Positive results do not rule out bacterial infection or co-infection with other viruses.  The expected result is Negative. Fact Sheet for Patients: SugarRoll.be Fact Sheet for  Healthcare Providers: https://www.woods-mathews.com/ This test is not yet approved or cleared by the [place] FDA and  has been authorized for detection and/or diagnosis of SARS-CoV-2 by FDA under an Emergency Use Authorization (EUA). This EUA will remain  in effect (meaning this test can be used) for t he duration of the COVID-19 declaration under Section 564(b)(1) of the Act, 21 U.S.C. section 360bbb-3(b)(1), unless the authorization is terminated or revoked sooner. Performed at [facility], [place] [address] [place], [place] [address]   MRSA PCR Screening     Status: Abnormal   Collection Time: 04/12/19  4:38 AM   Specimen: Nasal Mucosa; Nasopharyngeal  Result Value Ref Range Status   MRSA by PCR POSITIVE (A) NEGATIVE Final    Comment:        The GeneXpert MRSA Assay (FDA approved for NASAL specimens only), is one component of a comprehensive MRSA colonization surveillance program. It is not intended to diagnose MRSA infection nor to guide or monitor treatment for MRSA infections. RESULT CALLED TO, READ BACK BY AND VERIFIED WITH: Zack Seal RN 9:35 04/12/19 ([email or web address]) Performed at [facility], [place] [address] [place], [place] [address]          Radiology Studies: No results found.      Scheduled Meds: . buPROPion  300 mg Oral Daily  . Chlorhexidine Gluconate Cloth  6 each Topical Daily  . enoxaparin (LOVENOX) injection  30 mg Subcutaneous Q24H  . escitalopram  5 mg Oral Daily  . Melatonin  3 mg Oral QHS  . multivitamin with minerals  1 tablet Oral Daily  . pantoprazole  40 mg Oral Daily  . pravastatin  20 mg Oral Daily  . saccharomyces boulardii  250 mg Oral BID   Continuous Infusions: . sodium chloride 75 mL/hr at 04/14/19 0623  . vancomycin Stopped (04/13/19 2214)     LOS: 2 days   Time spent: 85mn  PDomenic Polite MD [facility]  04/14/2019, 12:56 PM

## 2019-04-15 DIAGNOSIS — K50119 Crohn's disease of large intestine with unspecified complications: Secondary | ICD-10-CM

## 2019-04-15 LAB — CREATININE, SERUM
Creatinine, Ser: 1.32 mg/dL — ABNORMAL HIGH (ref 0.44–1.00)
GFR calc Af Amer: 49 mL/min — ABNORMAL LOW (ref >60)
GFR calc non Af Amer: 42 mL/min — ABNORMAL LOW (ref >60)

## 2019-04-15 NOTE — Progress Notes (Signed)
PROGRESS NOTE    Katrina Boyer  JHE:[id number] DOB: February 24, 1951 DOA: 04/12/2019 PCP: Seward Carol, MD   Brief Narrative: 69 year old female with history of severe chronic Crohn's disease with fistulas, multiple surgeries, colectomy, end ileostomy, on TPN, recurrent C. difficile colitis, anxiety, history of line infections was sent from [facility] overnight with reports of positive blood cultures,ED triage note says gram-positive cocci in her left arm,she is bed/wheelchair-bound at baseline  Subjective:  Afebrile overnight On ns at 67m/hr and vancomycin iv C/o  Loose stool but not new for her.  Assessment & Plan:   Staph epidermidis bacteremia/line infection[facility] in [facility] blood - Dr JBroadus Johncalled and [facility] report that both sets of blood cultures with all 4 bottles were positive for staph epidermidis, this was sensitive to vancomycin, linezolid, tigecycline, tetracycline and rifampin, intermediate to moxifloxacin and resistant to ciprofloxacin and oxacillin  -ID was called and discussed ( Dr. VTommy Medal1/1 by Dr JBroadus John -recommended removal of PICC line, treatment with IV vancomycin for 7 days- Today day 3/7. Blood cultures repeated 1/2- NGTD. -Okay to replace PICC line if cultures negative for 48 hours.  Covid Positive,incidentally positive PCR.  Patient is asymptomatic without respiratory symptoms or hypoxia.  Chest x-ray clear and is afebrile.  Inflammatory markers are as below continue to monitor and provide supportive care  Recent Labs    04/13/19 0345  DDIMER 1.19*  FERRITIN 735*  CRP 7.7*   Lab Results  Component Value Date   SARSCOV2NAA POSITIVE (A) 04/12/2019   SRidgewayNEGATIVE 01/30/2019   SScrantonNEGATIVE 01/24/2019   CKD stage IIIb: Baseline creatinine around 1.29.  Monitor renal functions closely.  Recent Labs  Lab 04/12/19 0101  04/13/19 0345 04/14/19 0219 04/15/19 0402  BUN 33* 27* 25*  --   CREATININE 1.17* 1.22* 1.28* 1.32*    History of recurrent C. difficile colitis: c/o loose stool which is chronic.  Patient had completed multiple rounds of oral vancomycin with prolonged tapers.  Continue probiotics.  Severe protein calorie malnutrition:will need PICC line at 48 hours negative blood culture and resume TPN.    Severe Crohn's disease with fistulous: [facility] is stable - follow-up outpatient.   Anemia of chronic disease and iron deficiency: hemoglobin stable.  Monitor.    Hyponatremia in the setting of Crohn's disease.  Monitor sodium. Metabolic acidosis in the setting of Crohn's disease, encourage oral intake and monitor.  Pressure Ulcer: Pressure Injury 04/12/19 Coccyx Stage 2 -  Partial thickness loss of dermis presenting as a shallow open injury with a red, pink wound bed without slough. TMonia Pouch(Active)  04/12/19 0100  Location: Coccyx  Location Orientation:   Staging: Stage 2 -  Partial thickness loss of dermis presenting as a shallow open injury with a red, pink wound bed without slough.  Wound Description (Comments): T Ryan  Present on Admission: Yes    Body mass index is 16.64 kg/m.    DVT prophylaxis: lovenox Code Status: FUll Code Family Communication:Plan of care discussed with the patient. Disposition Plan: Home pending info on cultures, once PICC line is placed  Consultants: ID Procedures: PICC line removed 1/1  Antimicrobials: Anti-infectives (From admission, onward)   Start     Dose/Rate Route Frequency Ordered Stop   04/13/19 0200  vancomycin (VANCOREADY) IVPB 500 mg/100 mL  500 mg 100 mL/hr over 60 Minutes Intravenous Every 24 hours 04/12/19 1820     04/12/19 0100  vancomycin (VANCOCIN) IVPB 1000 mg/200 mL premix     1,000 mg 200 mL/hr over 60 Minutes Intravenous  Once 04/12/19 0049  04/12/19 0223   04/12/19 0045  vancomycin (VANCOCIN) 20 mg/kg in sodium chloride 0.9 % 500 mL IVPB  Status:  Discontinued     20 mg/kg 250 mL/hr over 120 Minutes Intravenous  Once 04/12/19 0043 04/12/19 0048       Objective: Vitals:   04/14/19 0622 04/14/19 1439 04/14/19 2030 04/15/19 0424  BP: 118/62 115/71 128/78 105/73  Pulse: 82 82 82 85  Resp: 18 20 18 20   Temp: 98.8 F (37.1 C) 99.3 F (37.4 C) 99.5 F (37.5 C) 99.1 F (37.3 C)  TempSrc: Oral Oral Oral Oral  SpO2: 97% 99% 100% 94%  Weight:      Height:        Intake/Output Summary (Last 24 hours) at 04/15/2019 1034 Last data filed at 04/15/2019 0200 Gross per 24 hour  Intake 1329.73 ml  Output --  Net 1329.73 ml   Filed Weights   04/12/19 0824  Weight: 44 kg   Weight change:   Body mass index is 16.64 kg/m.  Intake/Output from previous day: 01/02 0701 - 01/03 0700 In: 1720.6 [P.O.:240; I.V.:1380.6; IV Piggyback:100] Out: -  Intake/Output this shift: No intake/output data recorded.  Examination:  General exam: AAO,, ill frail, weak. HEENT:Oral mucosa moist, Ear/Nose WNL grossly, dentition normal. Respiratory system: Diminished at the base,no wheezing or crackles,no use of accessory muscle Cardiovascular system: S1 & S2 +, No JVD,. Gastrointestinal system: Abdomen soft, NT,ND, BS+ Nervous System:Alert, awake, moving extremities and grossly nonfocal Extremities: No edema, distal peripheral pulses palpable.  Skin: No rashes,no icterus. MSK: Normal muscle bulk,tone, power  Medications:  Scheduled Meds: . buPROPion  300 mg Oral Daily  . Chlorhexidine Gluconate Cloth  6 each Topical Daily  . enoxaparin (LOVENOX) injection  30 mg Subcutaneous Q24H  . escitalopram  5 mg Oral Daily  . Melatonin  3 mg Oral QHS  . multivitamin with minerals  1 tablet Oral Daily  . pantoprazole  40 mg Oral Daily  . pravastatin  20 mg Oral Daily  . saccharomyces boulardii  250 mg Oral BID   Continuous Infusions: . sodium  chloride 75 mL/hr at 04/15/19 0200  . vancomycin 500 mg (04/14/19 2236)    Data Reviewed: I have personally reviewed following labs and imaging studies  CBC: Recent Labs  Lab 04/12/19 0101 04/13/19 0345 04/14/19 0219  WBC 6.1 5.3 5.4  NEUTROABS 3.6  --   --   HGB 8.3* 7.8* 8.1*  HCT 26.9* 25.2* 26.6*  MCV 95.4 94.7 95.0  PLT 238 215 793   Basic Metabolic Panel: Recent Labs  Lab 04/12/19 0101 04/13/19 0345 04/14/19 0219 04/15/19 0402  NA 131* 133* 131*  --   K 4.6 4.1 3.9  --   CL 97* 101 100  --   CO2 25 23 19*  --   GLUCOSE 91 92 83  --   BUN 33* 27* 25*  --   CREATININE 1.17* 1.22* 1.28* 1.32*  CALCIUM 9.3 9.0 8.6*  --    GFR: Estimated Creatinine Clearance: 29.1 mL/min (A) (by C-G formula based on SCr of 1.32 mg/dL (H)). Liver Function Tests: Recent Labs  Lab 04/12/19 0101 04/13/19 0345  AST 28 36  ALT 20 25  ALKPHOS 140* 136*  BILITOT  0.5 0.5  PROT 7.6 7.4  ALBUMIN 2.5* 2.3*   No results for input(s): LIPASE, AMYLASE in the last 168 hours. No results for input(s): AMMONIA in the last 168 hours. Coagulation Profile: No results for input(s): INR, PROTIME in the last 168 hours. Cardiac Enzymes: No results for input(s): CKTOTAL, CKMB, CKMBINDEX, TROPONINI in the last 168 hours. BNP (last 3 results) No results for input(s): PROBNP in the last 8760 hours. HbA1C: No results for input(s): HGBA1C in the last 72 hours. CBG: No results for input(s): GLUCAP in the last 168 hours. Lipid Profile: No results for input(s): CHOL, HDL, LDLCALC, TRIG, CHOLHDL, LDLDIRECT in the last 72 hours. Thyroid Function Tests: No results for input(s): TSH, T4TOTAL, FREET4, T3FREE, THYROIDAB in the last 72 hours. Anemia Panel: Recent Labs    04/13/19 0345  FERRITIN 735*   Sepsis Labs: Recent Labs  Lab 04/11/19 2136  LATICACIDVEN 1.0    Recent Results (from the past 240 hour(s))  Blood culture (routine x 2)     Status: None (Preliminary result)   Collection Time:  04/12/19  1:01 AM   Specimen: BLOOD  Result Value Ref Range Status   Specimen Description   Final    BLOOD BLOOD RIGHT FOREARM Performed at [place] [address] [place], [place] [address]    Special Requests   Final    BOTTLES DRAWN AEROBIC AND ANAEROBIC Blood Culture adequate volume Performed at [facility] [address], [place], [place] [address]    Culture   Final    NO GROWTH 3 DAYS Performed at [facility], [place] [address], [place], [place] [address]    Report Status PENDING  Incomplete  Blood culture (routine x 2)     Status: None (Preliminary result)   Collection Time: 04/12/19  1:10 AM   Specimen: BLOOD  Result Value Ref Range Status   Specimen Description   Final    BLOOD BLOOD LEFT FOREARM Performed at [facility] [address] [place], [place] [address]    Special Requests   Final    BOTTLES DRAWN AEROBIC AND ANAEROBIC Blood Culture adequate volume Performed at [facility] [address] [place],  [address]    Culture   Final    NO GROWTH 3 DAYS Performed at [facility], [place] [address] [place],  [address]    Report Status PENDING  Incomplete  SARS CORONAVIRUS 2 (TAT 6-24 HRS) Nasopharyngeal Nasopharyngeal Swab     Status: Abnormal   Collection Time: 04/12/19  2:45 AM   Specimen: Nasopharyngeal Swab  Result Value Ref Range Status   SARS Coronavirus 2 POSITIVE (A) NEGATIVE Final    Comment: RESULT CALLED TO, READ BACK BY AND VERIFIED WITH: Zack Seal RN 15:30 04/12/19 ([email or web address]) (NOTE) SARS-CoV-2 target nucleic acids are DETECTED. The SARS-CoV-2 RNA is generally detectable in upper and lower respiratory specimens during the acute phase of infection. Positive results are indicative of the presence of SARS-CoV-2 RNA. Clinical correlation with patient history and other diagnostic information is  necessary to determine patient infection status.  Positive results do not rule out bacterial infection or co-infection with other viruses.  The expected result is Negative. Fact Sheet for Patients: SugarRoll.be Fact Sheet for Healthcare Providers: https://www.woods-mathews.com/ This test is not yet approved or cleared by the [place] FDA and  has been authorized for detection and/or diagnosis of SARS-CoV-2 by FDA under an Emergency Use Authorization (EUA). This  EUA will remain  in effect (meaning this test can be used) for t he duration of the COVID-19 declaration under Section 564(b)(1) of the Act, 21 U.S.C. section 360bbb-3(b)(1), unless the authorization is terminated or revoked sooner. Performed at [facility], [place] [address] [place], [place] [address]   MRSA PCR Screening     Status: Abnormal   Collection Time: 04/12/19  4:38 AM   Specimen: Nasal Mucosa; Nasopharyngeal  Result Value Ref Range Status   MRSA by PCR POSITIVE (A) NEGATIVE Final    Comment:        The GeneXpert MRSA Assay (FDA approved for NASAL specimens only), is one component of a comprehensive MRSA colonization surveillance program. It is not intended to diagnose MRSA infection nor to guide or monitor treatment for MRSA infections. RESULT CALLED TO, READ BACK BY AND VERIFIED WITH: Zack Seal RN 9:35 04/12/19 ([email or web address]) Performed at [facility], [place] [address] [place], [place] [address]   Culture, blood (routine x 2)     Status: None (Preliminary result)   Collection Time: 04/14/19  8:35 AM   Specimen: BLOOD  Result Value Ref Range Status   Specimen Description   Final    BLOOD LEFT ARM Performed at [facility] [address], [place], [place] [address]    Special Requests   Final    BOTTLES DRAWN AEROBIC AND ANAEROBIC Blood Culture adequate volume Performed at [place] [address], [place], [place] [address]    Culture   Final    NO GROWTH <  24 HOURS Performed at [facility] [address] [place], [place] [address]    Report Status PENDING  Incomplete  Culture, blood (routine x 2)     Status: None (Preliminary result)   Collection Time: 04/14/19  8:35 AM   Specimen: BLOOD LEFT HAND  Result Value Ref Range Status   Specimen Description   Final    BLOOD LEFT HAND Performed at [facility] [address] [place], [place] [address]    Special Requests   Final    BOTTLES DRAWN AEROBIC ONLY Blood Culture adequate volume Performed at [facility] [place] [address], [place], [place] [address]    Culture   Final    NO GROWTH < 24 HOURS Performed at [place] [address] [place],  [address]    Report Status PENDING  Incomplete      Radiology Studies: No results found.    LOS: 3 days   Time spent: More than 50% of that time was spent in counseling and/or coordination of care.  Antonieta Pert, MD [facility]  04/15/2019, 10:34 AM

## 2019-04-15 NOTE — Progress Notes (Signed)
Pharmacy Antibiotic Note  Katrina Boyer is a 69 y.o. female admitted on 04/12/2019 with bacteremia.  Pharmacy has been consulted for vancomycin dosing. 04/15/2019  Day # 4 total abx,  Day #2/7 post PICC removal Outside BCx: MRSE - methicillin resistant staph epidermidis 12/31 BCx2: NGx3days, 12/ BCx2: ngx 1 day. AF, WBC WNL SCr creeping up daily. 1.32 today Covid + with no symptoms  Plan: Continue vancomycin 500 mg IV q24  Estimate AUC 552.4 SCr 1.32, TBW,  Consider vancomycin levels around 1/4 dose  Height: 5' 4.02" (162.6 cm) Weight: 97 lb (44 kg) IBW/kg (Calculated) : 54.74  Temp (24hrs), Avg:99.2 F (37.3 C), Min:98.9 F (37.2 C), Max:99.5 F (37.5 C)  Recent Labs  Lab 04/11/19 2136 04/12/19 0101 04/13/19 0345 04/14/19 0219 04/15/19 0402  WBC  --  6.1 5.3 5.4  --   CREATININE  --  1.17* 1.22* 1.28* 1.32*  LATICACIDVEN 1.0  --   --   --   --     Estimated Creatinine Clearance: 29.1 mL/min (A) (by C-G formula based on SCr of 1.32 mg/dL (H)).    Allergies  Allergen Reactions  . Piperacillin-Tazobactam In Dex     Thrombocytopenia   . Metronidazole     neuropathy  . Gabapentin   . Mercaptopurine Nausea Only  . Ciprofloxacin Rash  Antimicrobials this admission:  12/31 vancomycin >> Dose adjustments this admission:   Microbiology results:  12/30 Bcx: [facility] lab): MRSE, staph epidermidis 12/31 BCx x2: ngtd 12/31 MRSA PCR: positive 12/31 COVID: positive 1/2 BCX2: ngtd  Thank you for allowing pharmacy to be a part of this patient's care.  Eudelia Bunch, Pharm.D [phone] 04/15/2019 1:39 PM

## 2019-04-15 NOTE — Plan of Care (Signed)
  Problem: Activity: Goal: Risk for activity intolerance will decrease Outcome: Not Progressing   Problem: Nutrition: Goal: Adequate nutrition will be maintained Outcome: Not Progressing   Problem: Elimination: Goal: Will not experience complications related to bowel motility Outcome: Not Progressing   Problem: Education: Goal: Knowledge of General Education information will improve Description: Including pain rating scale, medication(s)/side effects and non-pharmacologic comfort measures Outcome: Adequate for Discharge   Problem: Health Behavior/Discharge Planning: Goal: Ability to manage health-related needs will improve Outcome: Adequate for Discharge

## 2019-04-16 ENCOUNTER — Inpatient Hospital Stay: Payer: Self-pay

## 2019-04-16 DIAGNOSIS — L8990 Pressure ulcer of unspecified site, unspecified stage: Secondary | ICD-10-CM | POA: Insufficient documentation

## 2019-04-16 DIAGNOSIS — K50119 Crohn's disease of large intestine with unspecified complications: Secondary | ICD-10-CM

## 2019-04-16 DIAGNOSIS — R7881 Bacteremia: Secondary | ICD-10-CM

## 2019-04-16 LAB — VANCOMYCIN, TROUGH: Vancomycin Tr: 10 ug/mL — ABNORMAL LOW (ref 15–20)

## 2019-04-16 LAB — COMPREHENSIVE METABOLIC PANEL
ALT: 29 U/L (ref 0–44)
AST: 40 U/L (ref 15–41)
Albumin: 2.5 g/dL — ABNORMAL LOW (ref 3.5–5.0)
Alkaline Phosphatase: 133 U/L — ABNORMAL HIGH (ref 38–126)
Anion gap: 8 (ref 5–15)
BUN: 24 mg/dL — ABNORMAL HIGH (ref 8–23)
CO2: 18 mmol/L — ABNORMAL LOW (ref 22–32)
Calcium: 8.9 mg/dL (ref 8.9–10.3)
Chloride: 107 mmol/L (ref 98–111)
Creatinine, Ser: 1.44 mg/dL — ABNORMAL HIGH (ref 0.44–1.00)
GFR calc Af Amer: 44 mL/min — ABNORMAL LOW (ref >60)
GFR calc non Af Amer: 38 mL/min — ABNORMAL LOW (ref >60)
Glucose, Bld: 89 mg/dL (ref 70–99)
Potassium: 3.9 mmol/L (ref 3.5–5.1)
Sodium: 133 mmol/L — ABNORMAL LOW (ref 135–145)
Total Bilirubin: 0.2 mg/dL — ABNORMAL LOW (ref 0.3–1.2)
Total Protein: 7.6 g/dL (ref 6.5–8.1)

## 2019-04-16 LAB — CBC
HCT: 26.3 % — ABNORMAL LOW (ref 36.0–46.0)
Hemoglobin: 8.1 g/dL — ABNORMAL LOW (ref 12.0–15.0)
MCH: 28.9 pg (ref 26.0–34.0)
MCHC: 30.8 g/dL (ref 30.0–36.0)
MCV: 93.9 fL (ref 80.0–100.0)
Platelets: 232 10*3/uL (ref 150–400)
RBC: 2.8 MIL/uL — ABNORMAL LOW (ref 3.87–5.11)
RDW: 15 % (ref 11.5–15.5)
WBC: 7 10*3/uL (ref 4.0–10.5)
nRBC: 0 % (ref 0.0–0.2)

## 2019-04-16 LAB — D-DIMER, QUANTITATIVE: D-Dimer, Quant: 1.04 ug/mL-FEU — ABNORMAL HIGH (ref 0.00–0.50)

## 2019-04-16 LAB — FERRITIN: Ferritin: 841 ng/mL — ABNORMAL HIGH (ref 11–307)

## 2019-04-16 LAB — C-REACTIVE PROTEIN: CRP: 4.1 mg/dL — ABNORMAL HIGH (ref <1.0)

## 2019-04-16 LAB — GLUCOSE, CAPILLARY: Glucose-Capillary: 83 mg/dL (ref 70–99)

## 2019-04-16 NOTE — NC FL2 (Signed)
[place] LEVEL OF CARE SCREENING TOOL     IDENTIFICATION  Patient Name: Katrina Boyer Birthdate: 03/12/1951 Sex: female Admission Date (Current Location): 04/12/2019  [facility] and [place] Number:      Facility and Address:         Provider Number: [id number]  Attending Physician Name and Address:  Arma Heading, MD  Relative Name and Phone Number:       Current Level of Care: Hospital Recommended Level of Care: [place] Prior Approval Number:    Date Approved/Denied:   PASRR Number:    Discharge Plan: SNF    Current Diagnoses: Patient Active Problem List   Diagnosis Date Noted  . Blood bacterial culture positive 04/12/2019  . Gram-positive cocci bacteremia 04/12/2019  . MRSA bacteremia 01/25/2019  . Bacteremia 01/24/2019  . Crohn's disease ([place]) 01/24/2019    Orientation RESPIRATION BLADDER Height & Weight     Self, Time, Situation, Place  Normal Incontinent Weight: 44 kg Height:  5' 4.02" (162.6 cm)  BEHAVIORAL SYMPTOMS/MOOD NEUROLOGICAL BOWEL NUTRITION STATUS      Incontinent Diet(Regular)  AMBULATORY STATUS COMMUNICATION OF NEEDS Skin   Extensive Assist Verbally Other (Comment)(Pressure injury, coccx stage II, moisture barrier for area, Gentitalia redness and swelling moisture barrier)                       Personal Care Assistance Level of Assistance  Bathing, Feeding Bathing Assistance: Maximum assistance Feeding assistance: Independent       Functional Limitations Info  Sight, Hearing, Speech Sight Info: Adequate Hearing Info: Adequate Speech Info: Adequate    SPECIAL CARE FACTORS FREQUENCY  PT (By licensed PT), OT (By licensed OT)     PT Frequency: Eval and Treat OT Frequency: Eval and Treat            Contractures Contractures Info: Not present    Additional Factors Info  Code Status, Allergies Code Status Info: FULL Allergies Info: Piperacillin tazobactam In Dex, Metronidazole, Gabapentin,  Mercaptopurine, Ciprofloxacin           Current Medications (04/16/2019):  This is the current hospital active medication list Current Facility-Administered Medications  Medication Dose Route Frequency Provider Last Rate Last Admin  . 0.9 %  sodium chloride infusion   Intravenous Continuous Domenic Polite, MD 75 mL/hr at 04/16/19 0216 New Bag at 04/16/19 0216  . acetaminophen (TYLENOL) tablet 650 mg  650 mg Oral Q4H PRN Domenic Polite, MD      . albuterol (PROVENTIL) (2.5 MG/3ML) 0.083% nebulizer solution 2.5 mg  2.5 mg Nebulization Q4H PRN Domenic Polite, MD      . buPROPion (WELLBUTRIN XL) 24 hr tablet 300 mg  300 mg Oral Daily Domenic Polite, MD   300 mg at 04/16/19 1035  . Chlorhexidine Gluconate Cloth 2 % PADS 6 each  6 each Topical Daily Bunnie Pion Z, DO   6 each at 04/16/19 1036  . enoxaparin (LOVENOX) injection 30 mg  30 mg Subcutaneous Q24H Domenic Polite, MD   30 mg at 04/15/19 2104  . escitalopram (LEXAPRO) tablet 5 mg  5 mg Oral Daily Domenic Polite, MD   5 mg at 04/16/19 1035  . hyoscyamine (LEVSIN SL) SL tablet 0.125 mg  0.125 mg Sublingual Q4H PRN Domenic Polite, MD      . liver oil-zinc oxide (DESITIN) 40 % ointment   Topical PRN Domenic Polite, MD   Given at 04/14/19 1119  . loperamide (IMODIUM) capsule 4 mg  4 mg Oral QID PRN Domenic Polite, MD   4 mg at 04/16/19 1035  . Melatonin TABS 3 mg  3 mg Oral QHS Domenic Polite, MD      . methocarbamol (ROBAXIN) tablet 500 mg  500 mg Oral Q6H PRN Domenic Polite, MD      . multivitamin with minerals tablet 1 tablet  1 tablet Oral Daily Domenic Polite, MD   1 tablet at 04/16/19 1035  . oxyCODONE (Oxy IR/ROXICODONE) immediate release tablet 5 mg  5 mg Oral Q4H PRN Domenic Polite, MD   5 mg at 04/16/19 1035  . pantoprazole (PROTONIX) EC tablet 40 mg  40 mg Oral Daily Domenic Polite, MD   40 mg at 04/16/19 1035  . pravastatin (PRAVACHOL) tablet 20 mg  20 mg Oral Daily Domenic Polite, MD   20 mg at 04/16/19 1035  .  saccharomyces boulardii (FLORASTOR) capsule 250 mg  250 mg Oral BID Domenic Polite, MD   250 mg at 04/16/19 1035  . sodium chloride flush (NS) 0.9 % injection 10-40 mL  10-40 mL Intracatheter PRN Bunnie Pion Z, DO      . vancomycin (VANCOREADY) IVPB 500 mg/100 mL  500 mg Intravenous Q24H Minda Ditto, RPH   Stopped at 04/15/19 2207  . zolpidem (AMBIEN) tablet 5 mg  5 mg Oral QHS PRN Domenic Polite, MD   5 mg at 04/15/19 2105     Discharge Medications: Please see discharge summary for a list of discharge medications.  Relevant Imaging Results:  Relevant Lab Results:   Additional Information [phone]  Purcell Mouton, RN

## 2019-04-16 NOTE — TOC Progression Note (Signed)
Transition of Care [facility]) - Progression Note    Patient Details  Name: Katrina Boyer MRN: [id number] Date of Birth: 1950-05-16  Transition of Care [facility]) CM/SW Contact  Purcell Mouton, RN Phone Number: 04/16/2019, 12:59 PM  Clinical Narrative:    Pt is from [facility] and will transfer back when stable with IV ABX and TPN. Will complete and fax FL2 to [facility].         Expected Discharge Plan and Services                                                 Social Determinants of Health (SDOH) Interventions    Readmission Risk Interventions No flowsheet data found.

## 2019-04-16 NOTE — Progress Notes (Signed)
PROGRESS NOTE    Katrina Boyer  WJX:[id number] DOB: 03-25-1951 DOA: 04/12/2019 PCP: Seward Carol, MD   Brief Narrative:  69 year old female with history of severe chronic Crohn's disease with fistulas, multiple surgeries, colectomy, end ileostomy, on TPN, recurrent C. difficile colitis, anxiety, history of line infections was sent from [facility] SNF overnight with reports of positive blood cultures,ED triage note says gram-positive cocci in her left arm,she is bed/wheelchair-bound at baseline  Subjective:  No acute complaints. Vital signs are stable. No other complaints. Waiting for repeat blood cultures to be negative so she can receive her PICC line.  Assessment & Plan:   Principal Problem:   Crohn's disease ([place]) Active Problems:   Blood bacterial culture positive   Gram-positive cocci bacteremia   Pressure injury of skin  Staph epidermidis bacteremia/line infection: sent from [facility] with report of gram-positive cocci in clusters in her blood- Dr Broadus John called and obtained blood culture report from [facility] clinical lab -they report that both sets of blood cultures with all 4 bottles were positive for staph epidermidis, this was sensitive to vancomycin, linezolid, tigecycline, tetracycline and rifampin, intermediate to moxifloxacin and resistant to ciprofloxacin and oxacillin  -ID was called and discussed ( Dr. Neomia Glass by Dr Broadus John) -recommended removal of PICC line, treatment with IV vancomycin for 7 days- Today day 4/7. Blood cultures repeated 2/2- NGTD. -Okay to replace PICC line if cultures negative for 48 hours. - BCX negative, will place order for PICC  Covid Positive,incidentally positive PCR.  Patient is asymptomatic without respiratory symptoms or hypoxia.  Chest x-ray clear and is afebrile.  Inflammatory markers are as below continue to monitor and provide supportive care  CKD stage IIIb: Baseline creatinine around 1.29.  Monitor renal functions  closely.  History of recurrent C. difficile colitis: c/o loose stool which is chronic.  Patient had completed multiple rounds of oral vancomycin with prolonged tapers.  Continue probiotics.  Severe protein calorie malnutrition:will need PICC line at 48 hours negative blood culture and resume TPN.    Severe Crohn's disease with fistulous: Followed by GI and general surgery at [facility] for further surgery at some point when she is stable - follow-up outpatient.   Anemia of chronic disease and iron deficiency: hemoglobin stable.  Monitor.    Hyponatremia in the setting of Crohn's disease.  Monitor sodium. Metabolic acidosis in the setting of Crohn's disease, encourage oral intake and monitor.  Pressure Ulcer: Pressure Injury 04/12/19 Coccyx Stage 2 -  Partial thickness loss of dermis presenting as a shallow open injury with a red, pink wound bed without slough. Monia Pouch (Active)  04/12/19 0100  Location: Coccyx  Location Orientation:   Staging: Stage 2 -  Partial thickness loss of dermis presenting as a shallow open injury with a red, pink wound bed without slough.  Wound Description (Comments): T Ryan  Present on Admission: Yes    Body mass index is 16.64 kg/m.    DVT prophylaxis: lovenox Code Status:FUll Code Family Communication:Plan of care discussed with the patient. Disposition Plan:Home pending info on cultures, once PICC line is placed  Consultants:ID Procedures:PICC line removed 1/1  Antimicrobials: vancomycin   Objective: Vitals:   04/15/19 1147 04/15/19 1951 04/16/19 0519 04/16/19 1322  BP: 118/65 131/71 96/62 122/68  Pulse: 81 78 81 76  Resp: 20 18 18 17   Temp: 98.9 F (37.2 C) 99.3 F (37.4 C) 98.7 F (37.1 C) 98.4 F (36.9 C)  TempSrc: Oral Oral Oral Oral  SpO2: 98% 96% 97%  98%  Weight:      Height:        Intake/Output Summary (Last 24 hours) at 04/16/2019 1756 Last data filed at 04/16/2019 0930 Gross per 24 hour  Intake 2174.59 ml    Output --  Net 2174.59 ml   Filed Weights   04/12/19 0824  Weight: 44 kg    Examination:  General exam: Appears calm and comfortable  Respiratory system: Clear to auscultation. Respiratory effort normal. Cardiovascular system: S1 & S2 heard, RRR.  Gastrointestinal system: Abdomen is nondistended, soft and nontender. No organomegaly or masses felt. Normal bowel sounds heard. Central nervous system: Alert and oriented. No focal neurological deficits. Extremities: Symmetric 5 x 5 power. Skin: No rashes, lesions or ulcers Psychiatry: Judgement and insight appear normal. Mood & affect appropriate.    Data Reviewed: I have personally reviewed following labs and imaging studies  CBC: Recent Labs  Lab 04/12/19 0101 04/13/19 0345 04/14/19 0219 04/16/19 0443  WBC 6.1 5.3 5.4 7.0  NEUTROABS 3.6  --   --   --   HGB 8.3* 7.8* 8.1* 8.1*  HCT 26.9* 25.2* 26.6* 26.3*  MCV 95.4 94.7 95.0 93.9  PLT 238 215 221 973   Basic Metabolic Panel: Recent Labs  Lab 04/12/19 0101 04/13/19 0345 04/14/19 0219 04/15/19 0402 04/16/19 0443  NA 131* 133* 131*  --  133*  K 4.6 4.1 3.9  --  3.9  CL 97* 101 100  --  107  CO2 25 23 19*  --  18*  GLUCOSE 91 92 83  --  89  BUN 33* 27* 25*  --  24*  CREATININE 1.17* 1.22* 1.28* 1.32* 1.44*  CALCIUM 9.3 9.0 8.6*  --  8.9   GFR: Estimated Creatinine Clearance: 26.7 mL/min (A) (by C-G formula based on SCr of 1.44 mg/dL (H)). Liver Function Tests: Recent Labs  Lab 04/12/19 0101 04/13/19 0345 04/16/19 0443  AST 28 36 40  ALT 20 25 29   ALKPHOS 140* 136* 133*  BILITOT 0.5 0.5 0.2*  PROT 7.6 7.4 7.6  ALBUMIN 2.5* 2.3* 2.5*   No results for input(s): LIPASE, AMYLASE in the last 168 hours. No results for input(s): AMMONIA in the last 168 hours. Coagulation Profile: No results for input(s): INR, PROTIME in the last 168 hours. Cardiac Enzymes: No results for input(s): CKTOTAL, CKMB, CKMBINDEX, TROPONINI in the last 168 hours. BNP (last 3  results) No results for input(s): PROBNP in the last 8760 hours. HbA1C: No results for input(s): HGBA1C in the last 72 hours. CBG: No results for input(s): GLUCAP in the last 168 hours. Lipid Profile: No results for input(s): CHOL, HDL, LDLCALC, TRIG, CHOLHDL, LDLDIRECT in the last 72 hours. Thyroid Function Tests: No results for input(s): TSH, T4TOTAL, FREET4, T3FREE, THYROIDAB in the last 72 hours. Anemia Panel: Recent Labs    04/16/19 0443  FERRITIN 841*   Sepsis Labs: Recent Labs  Lab 04/11/19 2136  LATICACIDVEN 1.0    Recent Results (from the past 240 hour(s))  Blood culture (routine x 2)     Status: None (Preliminary result)   Collection Time: 04/12/19  1:01 AM   Specimen: BLOOD  Result Value Ref Range Status   Specimen Description   Final    BLOOD BLOOD RIGHT FOREARM Performed at [facility] [address] [place], [place] [address]    Special Requests   Final    BOTTLES DRAWN AEROBIC AND ANAEROBIC Blood Culture adequate volume Performed at [facility], [address] [place], [place] [address]    Culture   Final    NO GROWTH 4 DAYS Performed at [facility], [place] [address], [place], [place] [address]    Report Status PENDING  Incomplete  Blood culture (routine x 2)     Status: None (Preliminary result)   Collection Time: 04/12/19  1:10 AM   Specimen: BLOOD  Result Value Ref Range Status   Specimen Description   Final    BLOOD BLOOD LEFT FOREARM Performed at [facility] [address] [place], [place] [address]    Special Requests   Final    BOTTLES DRAWN AEROBIC AND ANAEROBIC Blood Culture adequate volume Performed at [facility] [address] [place], [place] [address]    Culture   Final    NO GROWTH 4 DAYS Performed at [facility], [place] [address] [place], [place] [address]    Report Status PENDING  Incomplete  SARS CORONAVIRUS 2 (TAT 6-24 HRS)  Nasopharyngeal Nasopharyngeal Swab     Status: Abnormal   Collection Time: 04/12/19  2:45 AM   Specimen: Nasopharyngeal Swab  Result Value Ref Range Status   SARS Coronavirus 2 POSITIVE (A) NEGATIVE Final    Comment: RESULT CALLED TO, READ BACK BY AND VERIFIED WITH: Zack Seal RN 15:30 04/12/19 ([email or web address]) (NOTE) SARS-CoV-2 target nucleic acids are DETECTED. The SARS-CoV-2 RNA is generally detectable in upper and lower respiratory specimens during the acute phase of infection. Positive results are indicative of the presence of SARS-CoV-2 RNA. Clinical correlation with patient history and other diagnostic information is  necessary to determine patient infection status. Positive results do not rule out bacterial infection or co-infection with other viruses.  The expected result is Negative. Fact Sheet for Patients: SugarRoll.be Fact Sheet for Healthcare Providers: https://www.woods-mathews.com/ This test is not yet approved or cleared by the [place] FDA and  has been authorized for detection and/or diagnosis of SARS-CoV-2 by FDA under an Emergency Use Authorization (EUA). This EUA will remain  in effect (meaning this test can be used) for t he duration of the COVID-19 declaration under Section 564(b)(1) of the Act, 21 U.S.C. section 360bbb-3(b)(1), unless the authorization is terminated or revoked sooner. Performed at [facility], [place] [address] [place], [place] [address]   MRSA PCR Screening     Status: Abnormal   Collection Time: 04/12/19  4:38 AM   Specimen: Nasal Mucosa; Nasopharyngeal  Result Value Ref Range Status   MRSA by PCR POSITIVE (A) NEGATIVE Final    Comment:        The GeneXpert MRSA Assay (FDA approved for NASAL specimens only), is one component of a comprehensive MRSA colonization surveillance program. It is not intended to diagnose MRSA infection nor to guide or monitor treatment for MRSA  infections. RESULT CALLED TO, READ BACK BY AND VERIFIED WITH: Zack Seal RN 9:35 04/12/19 ([email or web address]) Performed at [facility], [place] [address] [place], [place] [address]   Culture, blood (routine x 2)     Status: None (Preliminary result)   Collection Time: 04/14/19  8:35 AM   Specimen: BLOOD  Result Value Ref Range Status   Specimen Description   Final    BLOOD LEFT ARM Performed at [facility] [address], [place], [place] [address]    Special Requests   Final    BOTTLES DRAWN AEROBIC AND ANAEROBIC Blood Culture adequate volume Performed at [facility],  [place] [address] [place], [place] [address]    Culture   Final    NO GROWTH 2 DAYS Performed at [facility] [address] [place], [place] [address]    Report Status PENDING  Incomplete  Culture, blood (routine x 2)     Status: None (Preliminary result)   Collection Time: 04/14/19  8:35 AM   Specimen: BLOOD LEFT HAND  Result Value Ref Range Status   Specimen Description   Final    BLOOD LEFT HAND Performed at [facility] [address] [place], [place] [address]    Special Requests   Final    BOTTLES DRAWN AEROBIC ONLY Blood Culture adequate volume Performed at [facility] [address], [place], [place] [address]    Culture   Final    NO GROWTH 2 DAYS Performed at [facility] [address], [place], [place] [address]    Report Status PENDING  Incomplete         Radiology Studies: [place] EKG SITE RITE  Result Date: 04/16/2019 If Site Rite image not attached, placement could not be confirmed due to current cardiac rhythm.       Scheduled Meds: . buPROPion  300 mg Oral Daily  . Chlorhexidine Gluconate Cloth  6 each Topical Daily  . enoxaparin (LOVENOX) injection  30 mg Subcutaneous Q24H  . escitalopram  5 mg Oral Daily  . Melatonin  3 mg Oral QHS  . multivitamin with minerals  1 tablet Oral Daily  .  pantoprazole  40 mg Oral Daily  . pravastatin  20 mg Oral Daily  . saccharomyces boulardii  250 mg Oral BID   Continuous Infusions: . sodium chloride 75 mL/hr at 04/16/19 1532  . vancomycin Stopped (04/15/19 2207)     LOS: 4 days   Time spent: 30 minutes  Arma Heading, MD [facility]  If 7PM-7AM, please contact night-coverage [email or web address] Password [facility] 04/16/2019, 5:56 PM

## 2019-04-16 NOTE — Care Management Important Message (Signed)
Important Message  Patient Details IM Letter given to Gabriel Earing RN Case Manger to present to the Patient Name: Katrina Boyer MRN: [id number] Date of Birth: 27-Feb-1951   Medicare Important Message Given:        Kerin Salen 04/16/2019, 12:20 PM

## 2019-04-16 NOTE — Progress Notes (Signed)
Received order for PICC.   Plan on placing tomorrow.

## 2019-04-17 LAB — COMPREHENSIVE METABOLIC PANEL
ALT: 29 U/L (ref 0–44)
AST: 36 U/L (ref 15–41)
Albumin: 2.5 g/dL — ABNORMAL LOW (ref 3.5–5.0)
Alkaline Phosphatase: 133 U/L — ABNORMAL HIGH (ref 38–126)
Anion gap: 9 (ref 5–15)
BUN: 23 mg/dL (ref 8–23)
CO2: 16 mmol/L — ABNORMAL LOW (ref 22–32)
Calcium: 8.7 mg/dL — ABNORMAL LOW (ref 8.9–10.3)
Chloride: 106 mmol/L (ref 98–111)
Creatinine, Ser: 1.38 mg/dL — ABNORMAL HIGH (ref 0.44–1.00)
GFR calc Af Amer: 46 mL/min — ABNORMAL LOW (ref >60)
GFR calc non Af Amer: 40 mL/min — ABNORMAL LOW (ref >60)
Glucose, Bld: 91 mg/dL (ref 70–99)
Potassium: 4 mmol/L (ref 3.5–5.1)
Sodium: 131 mmol/L — ABNORMAL LOW (ref 135–145)
Total Bilirubin: 0.6 mg/dL (ref 0.3–1.2)
Total Protein: 7.3 g/dL (ref 6.5–8.1)

## 2019-04-17 LAB — CULTURE, BLOOD (ROUTINE X 2)
Culture: NO GROWTH
Culture: NO GROWTH
Special Requests: ADEQUATE
Special Requests: ADEQUATE

## 2019-04-17 LAB — C-REACTIVE PROTEIN: CRP: 3.9 mg/dL — ABNORMAL HIGH (ref <1.0)

## 2019-04-17 LAB — CBC
HCT: 25.3 % — ABNORMAL LOW (ref 36.0–46.0)
Hemoglobin: 7.8 g/dL — ABNORMAL LOW (ref 12.0–15.0)
MCH: 29.3 pg (ref 26.0–34.0)
MCHC: 30.8 g/dL (ref 30.0–36.0)
MCV: 95.1 fL (ref 80.0–100.0)
Platelets: 226 10*3/uL (ref 150–400)
RBC: 2.66 MIL/uL — ABNORMAL LOW (ref 3.87–5.11)
RDW: 15.2 % (ref 11.5–15.5)
WBC: 6.4 10*3/uL (ref 4.0–10.5)
nRBC: 0 % (ref 0.0–0.2)

## 2019-04-17 LAB — FERRITIN: Ferritin: 743 ng/mL — ABNORMAL HIGH (ref 11–307)

## 2019-04-17 LAB — D-DIMER, QUANTITATIVE: D-Dimer, Quant: 1.08 ug/mL-FEU — ABNORMAL HIGH (ref 0.00–0.50)

## 2019-04-17 LAB — VANCOMYCIN, PEAK: Vancomycin Pk: 26 ug/mL — ABNORMAL LOW (ref 30–40)

## 2019-04-17 MED ORDER — ONDANSETRON HCL 4 MG/2ML IJ SOLN: 4.0000 mg | Freq: Four times a day (QID) | INTRAMUSCULAR | Status: DC | PRN

## 2019-04-17 NOTE — Progress Notes (Signed)
Pharmacy Antibiotic Note  Katrina Boyer is a 69 y.o. female admitted on 04/12/2019 with MRSE bacteremia.  Pharmacy has been consulted for vancomycin dosing.  Today, 04/17/2019:  D6 total abx,  Day 4/7 post PICC removal  Afebrile, WBC WNL  SCr rising slowly, peaked at 1.44  Covid + with no symptoms  Vanc levels indicate AUC 429 (lower than estimated but still at goal)  Plan:  Continue vancomycin 500 mg IV q24 - AUC on the lower end of goal, but should be acceptable as long as patient remains clinically stable  Would increase vanc to 750 mg q24 hrs once SCr returns to baseline  Monitor troughs as clinically appropriate while vancomycin continues   Height: 5' 4.02" (162.6 cm) Weight: 97 lb (44 kg) IBW/kg (Calculated) : 54.74  Temp (24hrs), Avg:98.6 F (37 C), Min:98.4 F (36.9 C), Max:98.7 F (37.1 C)  Recent Labs  Lab 04/11/19 2136 04/12/19 0101 04/13/19 0345 04/14/19 0219 04/15/19 0402 04/16/19 0443 04/16/19 2045 04/17/19 0030  WBC  --  6.1 5.3 5.4  --  7.0  --  6.4  CREATININE  --  1.17* 1.22* 1.28* 1.32* 1.44*  --  1.38*  LATICACIDVEN 1.0  --   --   --   --   --   --   --   VANCOTROUGH  --   --   --   --   --   --  10*  --   VANCOPEAK  --   --   --   --   --   --   --  26*    Estimated Creatinine Clearance: 27.9 mL/min (A) (by C-G formula based on SCr of 1.38 mg/dL (H)).    Allergies  Allergen Reactions  . Piperacillin-Tazobactam In Dex     Thrombocytopenia   . Metronidazole     neuropathy  . Gabapentin   . Mercaptopurine Nausea Only  . Ciprofloxacin Rash   Antimicrobials this admission:  12/31 vancomycin >>  Dose adjustments this admission:  1/4 Vtr 10; 1/5 Vpk 26; AUC 429; continue 500 mg q24h  Microbiology results:  12/30 Bcx: ([facility]): MRSE, staph epidermidis 12/31 BCx: ngtd 12/31 MRSA PCR: positive 12/31 COVID: positive 1/2 BCX2: ngtd  Thank you for allowing pharmacy to be a part of this patient's care.  Reuel Boom, PharmD,  BCPS [phone] 04/17/2019, 2:04 AM

## 2019-04-17 NOTE — NC FL2 (Signed)
Katrina Boyer LEVEL OF CARE SCREENING TOOL     IDENTIFICATION  Patient Name: Katrina Boyer Birthdate: 05/30/1950 Sex: female Admission Date (Current Location): 04/12/2019  [facility] and [place] Number:      Facility and Address:         Provider Number: [id number]  Attending Physician Name and Address:  Arma Heading, MD  Relative Name and Phone Number:       Current Level of Care: Hospital Recommended Level of Care: [place] Prior Approval Number:    Date Approved/Denied:   PASRR Number:    Discharge Plan: SNF    Current Diagnoses: Patient Active Problem List   Diagnosis Date Noted  . Pressure injury of skin 04/16/2019  . Blood bacterial culture positive 04/12/2019  . Gram-positive cocci bacteremia 04/12/2019  . MRSA bacteremia 01/25/2019  . Bacteremia 01/24/2019  . Crohn's disease ([place]) 01/24/2019    Orientation RESPIRATION BLADDER Height & Weight     Self, Time, Situation, Place  Normal Incontinent Weight: 44 kg Height:  5' 4.02" (162.6 cm)  BEHAVIORAL SYMPTOMS/MOOD NEUROLOGICAL BOWEL NUTRITION STATUS      Incontinent Diet(Regular)  AMBULATORY STATUS COMMUNICATION OF NEEDS Skin   Extensive Assist Verbally Other (Comment)(Pressure injury, coccx stage II, moisture barrier for area, Gentitalia redness and swelling moisture barrier)                       Personal Care Assistance Level of Assistance  Bathing, Feeding Bathing Assistance: Maximum assistance Feeding assistance: Independent       Functional Limitations Info  Sight, Hearing, Speech Sight Info: Adequate Hearing Info: Adequate Speech Info: Adequate    SPECIAL CARE FACTORS FREQUENCY  PT (By licensed PT), OT (By licensed OT)     PT Frequency: Eval and Treat OT Frequency: Eval and Treat            Contractures Contractures Info: Not present    Additional Factors Info  Code Status, Allergies Code Status Info: FULL Allergies Info: Piperacillin  tazobactam In Dex, Metronidazole, Gabapentin, Mercaptopurine, Ciprofloxacin           Current Medications (04/17/2019):  This is the current hospital active medication list Current Facility-Administered Medications  Medication Dose Route Frequency Provider Last Rate Last Admin  . 0.9 %  sodium chloride infusion   Intravenous Continuous Domenic Polite, MD 75 mL/hr at 04/17/19 0547 New Bag at 04/17/19 0547  . acetaminophen (TYLENOL) tablet 650 mg  650 mg Oral Q4H PRN Domenic Polite, MD      . albuterol (PROVENTIL) (2.5 MG/3ML) 0.083% nebulizer solution 2.5 mg  2.5 mg Nebulization Q4H PRN Domenic Polite, MD      . buPROPion (WELLBUTRIN XL) 24 hr tablet 300 mg  300 mg Oral Daily Domenic Polite, MD   300 mg at 04/17/19 0942  . Chlorhexidine Gluconate Cloth 2 % PADS 6 each  6 each Topical Daily Bunnie Pion Z, DO   6 each at 04/16/19 1036  . enoxaparin (LOVENOX) injection 30 mg  30 mg Subcutaneous Q24H Domenic Polite, MD   30 mg at 04/16/19 2126  . escitalopram (LEXAPRO) tablet 5 mg  5 mg Oral Daily Domenic Polite, MD   5 mg at 04/17/19 0943  . hyoscyamine (LEVSIN SL) SL tablet 0.125 mg  0.125 mg Sublingual Q4H PRN Domenic Polite, MD      . liver oil-zinc oxide (DESITIN) 40 % ointment   Topical PRN Domenic Polite, MD   Given at 04/14/19 1119  .  loperamide (IMODIUM) capsule 4 mg  4 mg Oral QID PRN Domenic Polite, MD   4 mg at 04/16/19 2126  . Melatonin TABS 3 mg  3 mg Oral QHS Domenic Polite, MD      . methocarbamol (ROBAXIN) tablet 500 mg  500 mg Oral Q6H PRN Domenic Polite, MD      . multivitamin with minerals tablet 1 tablet  1 tablet Oral Daily Domenic Polite, MD   1 tablet at 04/17/19 [id number]  . oxyCODONE (Oxy IR/ROXICODONE) immediate release tablet 5 mg  5 mg Oral Q4H PRN Domenic Polite, MD   5 mg at 04/17/19 0544  . pantoprazole (PROTONIX) EC tablet 40 mg  40 mg Oral Daily Domenic Polite, MD   40 mg at 04/17/19 0942  . pravastatin (PRAVACHOL) tablet 20 mg  20 mg Oral Daily Domenic Polite, MD   20 mg at 04/17/19 0942  . saccharomyces boulardii (FLORASTOR) capsule 250 mg  250 mg Oral BID Domenic Polite, MD   250 mg at 04/17/19 1561  . sodium chloride flush (NS) 0.9 % injection 10-40 mL  10-40 mL Intracatheter PRN Bunnie Pion Z, DO      . vancomycin (VANCOREADY) IVPB 500 mg/100 mL  500 mg Intravenous Q24H Minda Ditto, RPH 100 mL/hr at 04/16/19 2130 500 mg at 04/16/19 2130  . zolpidem (AMBIEN) tablet 5 mg  5 mg Oral QHS PRN Domenic Polite, MD   5 mg at 04/16/19 2126     Discharge Medications: Please see discharge summary for a list of discharge medications.  Relevant Imaging Results:  Relevant Lab Results:   Additional Information [phone]  PICC line for IV ABX Vancomycin, TPN  Karolyne Timmons, RN

## 2019-04-17 NOTE — Progress Notes (Signed)
PT Cancellation Note  Patient Details Name: Katrina Boyer MRN: [id number] DOB: February 25, 1951   Cancelled Treatment:    Reason Eval/Treat Not Completed: PT screened, no needs identified, will sign off. Pt is from SNF and is planning to return to SNF. Will defer PT eval to next venue if warranted. Will sign off. Thanks.    Doreatha Massed, PT Acute Rehabilitation

## 2019-04-17 NOTE — Progress Notes (Signed)
Spoke with bedside nurse concerning PICC placement. Patient is to return to SNF with [facility] for TNA. No discharge date. TNA not starting today. Will continue to monitor.

## 2019-04-17 NOTE — Progress Notes (Signed)
PROGRESS NOTE    Katrina Boyer  UDJ:[id number] DOB: 1950/05/02 DOA: 04/12/2019 PCP: Seward Carol, MD   Brief Narrative:  69 year old female with history of severe chronic Crohn's disease with fistulas, multiple surgeries, colectomy, end ileostomy, on TPN, recurrent C. difficile colitis, anxiety, history of line infections was sent from [facility] SNF overnight with reports of positive blood cultures,ED triage note says gram-positive cocci in her left arm,she is bed/wheelchair-bound at baseline  Subjective:  No acute complaints. Vital signs are stable. Blood cultures negative, plan for PICC today or tomorrow.  No other complaints. Continue vancomycin. Will go back to SNF likely tomorrow. Eating and drinking.   Assessment & Plan:   Principal Problem:   Crohn's disease ([place]) Active Problems:   Blood bacterial culture positive   Gram-positive cocci bacteremia   Pressure injury of skin  Staph epidermidis bacteremia/line infection: sent from [facility] with report of gram-positive cocci in clusters in her blood- Dr Broadus John called and obtained blood culture report from [facility] clinical lab -they report that both sets of blood cultures with all 4 bottles were positive for staph epidermidis, this was sensitive to vancomycin, linezolid, tigecycline, tetracycline and rifampin, intermediate to moxifloxacin and resistant to ciprofloxacin and oxacillin  -ID was called and discussed ( Dr. Neomia Glass by Dr Broadus John) -recommended removal of PICC line, treatment with IV vancomycin for 7 days after removal of picc line- Today day 5/7. Blood cultures repeated 2/2- NGTD. -Okay to replace PICC line   Covid Positive,incidentally positive PCR.  Patient is asymptomatic without respiratory symptoms or hypoxia.  Chest x-ray clear and is afebrile.  Inflammatory markers are as below continue to monitor and provide supportive care  CKD stage IIIb: Baseline creatinine around 1.29.  Monitor renal functions  closely.  History of recurrent C. difficile colitis: c/o loose stool which is chronic.  Patient had completed multiple rounds of oral vancomycin with prolonged tapers.  Continue probiotics.  Severe protein calorie malnutrition:will need PICC line at 48 hours negative blood culture and resume TPN.    Severe Crohn's disease with fistulous: Followed by GI and general surgery at [place] for further surgery at some point when she is stable - follow-up outpatient.   Anemia of chronic disease and iron deficiency: hemoglobin stable.  Monitor.    Hyponatremia in the setting of Crohn's disease.  Monitor sodium. Metabolic acidosis in the setting of Crohn's disease, encourage oral intake and monitor.  Pressure Ulcer: Pressure Injury 04/12/19 Coccyx Stage 2 -  Partial thickness loss of dermis presenting as a shallow open injury with a red, pink wound bed without slough. Monia Pouch (Active)  04/12/19 0100  Location: Coccyx  Location Orientation:   Staging: Stage 2 -  Partial thickness loss of dermis presenting as a shallow open injury with a red, pink wound bed without slough.  Wound Description (Comments): T Ryan  Present on Admission: Yes    Body mass index is 16.64 kg/m.    DVT prophylaxis: lovenox Code Status:Full Code Family Communication:Plan of care discussed with the patient. Disposition Plan:Home pending info on cultures, once PICC line is placed  Consultants:ID Procedures:PICC line removed 1/1, new one to be placed  Antimicrobials: vancomycin   Objective: Vitals:   04/16/19 2146 04/17/19 0552 04/17/19 0600 04/17/19 1238  BP: 117/69 (!) 99/56 96/61 122/75  Pulse: 78 76  76  Resp: 17 14  17   Temp: 98.6 F (37 C) 98.5 F (36.9 C)  98.6 F (37 C)  TempSrc: Oral Oral  Oral  SpO2:  96% 95%  96%  Weight:      Height:        Intake/Output Summary (Last 24 hours) at 04/17/2019 1442 Last data filed at 04/17/2019 0844 Gross per 24 hour  Intake 120 ml  Output --  Net  120 ml   Filed Weights   04/12/19 0824  Weight: 44 kg    Examination:  General exam: Appears calm and comfortable, lying in bed, exam unchanged  Respiratory system: Clear to auscultation. Respiratory effort normal. Cardiovascular system: S1 & S2 heard, RRR.  Gastrointestinal system: Abdomen is nondistended, soft and nontender. No organomegaly or masses felt. Normal bowel sounds heard. Central nervous system: Alert and oriented. No focal neurological deficits. Extremities: Symmetric 5 x 5 power. Skin: No rashes, lesions or ulcers Psychiatry: Judgement and insight appear normal. Mood & affect appropriate.    Data Reviewed: I have personally reviewed following labs and imaging studies  CBC: Recent Labs  Lab 04/12/19 0101 04/13/19 0345 04/14/19 0219 04/16/19 0443 04/17/19 0030  WBC 6.1 5.3 5.4 7.0 6.4  NEUTROABS 3.6  --   --   --   --   HGB 8.3* 7.8* 8.1* 8.1* 7.8*  HCT 26.9* 25.2* 26.6* 26.3* 25.3*  MCV 95.4 94.7 95.0 93.9 95.1  PLT 238 215 221 232 144   Basic Metabolic Panel: Recent Labs  Lab 04/12/19 0101 04/13/19 0345 04/14/19 0219 04/15/19 0402 04/16/19 0443 04/17/19 0030  NA 131* 133* 131*  --  133* 131*  K 4.6 4.1 3.9  --  3.9 4.0  CL 97* 101 100  --  107 106  CO2 25 23 19*  --  18* 16*  GLUCOSE 91 92 83  --  89 91  BUN 33* 27* 25*  --  24* 23  CREATININE 1.17* 1.22* 1.28* 1.32* 1.44* 1.38*  CALCIUM 9.3 9.0 8.6*  --  8.9 8.7*   GFR: Estimated Creatinine Clearance: 27.9 mL/min (A) (by C-G formula based on SCr of 1.38 mg/dL (H)). Liver Function Tests: Recent Labs  Lab 04/12/19 0101 04/13/19 0345 04/16/19 0443 04/17/19 0030  AST 28 36 40 36  ALT 20 25 29 29   ALKPHOS 140* 136* 133* 133*  BILITOT 0.5 0.5 0.2* 0.6  PROT 7.6 7.4 7.6 7.3  ALBUMIN 2.5* 2.3* 2.5* 2.5*   No results for input(s): LIPASE, AMYLASE in the last 168 hours. No results for input(s): AMMONIA in the last 168 hours. Coagulation Profile: No results for input(s): INR, PROTIME in  the last 168 hours. Cardiac Enzymes: No results for input(s): CKTOTAL, CKMB, CKMBINDEX, TROPONINI in the last 168 hours. BNP (last 3 results) No results for input(s): PROBNP in the last 8760 hours. HbA1C: No results for input(s): HGBA1C in the last 72 hours. CBG: Recent Labs  Lab 04/16/19 2136  GLUCAP 83   Lipid Profile: No results for input(s): CHOL, HDL, LDLCALC, TRIG, CHOLHDL, LDLDIRECT in the last 72 hours. Thyroid Function Tests: No results for input(s): TSH, T4TOTAL, FREET4, T3FREE, THYROIDAB in the last 72 hours. Anemia Panel: Recent Labs    04/16/19 0443 04/17/19 0030  FERRITIN 841* 743*   Sepsis Labs: Recent Labs  Lab 04/11/19 2136  LATICACIDVEN 1.0    Recent Results (from the past 240 hour(s))  Blood culture (routine x 2)     Status: None   Collection Time: 04/12/19  1:01 AM   Specimen: BLOOD  Result Value Ref Range Status   Specimen Description   Final    BLOOD BLOOD RIGHT FOREARM Performed at [facility]  [place] [address] [place], [place] [address]    Special Requests   Final    BOTTLES DRAWN AEROBIC AND ANAEROBIC Blood Culture adequate volume Performed at [place] [address] [place], [place] [address]    Culture   Final    NO GROWTH 5 DAYS Performed at [facility], [place] [address], [place], [place] [address]    Report Status 04/17/2019 FINAL  Final  Blood culture (routine x 2)     Status: None   Collection Time: 04/12/19  1:10 AM   Specimen: BLOOD  Result Value Ref Range Status   Specimen Description   Final    BLOOD BLOOD LEFT FOREARM Performed at [place] [address] [place], [place] [address]    Special Requests   Final    BOTTLES DRAWN AEROBIC AND ANAEROBIC Blood Culture adequate volume Performed at [place] [address], [place], [place] [address]    Culture   Final    NO GROWTH 5 DAYS Performed at [facility], [place]  [address], [place], [place] [address]    Report Status 04/17/2019 FINAL  Final  SARS CORONAVIRUS 2 (TAT 6-24 HRS) Nasopharyngeal Nasopharyngeal Swab     Status: Abnormal   Collection Time: 04/12/19  2:45 AM   Specimen: Nasopharyngeal Swab  Result Value Ref Range Status   SARS Coronavirus 2 POSITIVE (A) NEGATIVE Final    Comment: RESULT CALLED TO, READ BACK BY AND VERIFIED WITH: Zack Seal RN 15:30 04/12/19 ([email or web address]) (NOTE) SARS-CoV-2 target nucleic acids are DETECTED. The SARS-CoV-2 RNA is generally detectable in upper and lower respiratory specimens during the acute phase of infection. Positive results are indicative of the presence of SARS-CoV-2 RNA. Clinical correlation with patient history and other diagnostic information is  necessary to determine patient infection status. Positive results do not rule out bacterial infection or co-infection with other viruses.  The expected result is Negative. Fact Sheet for Patients: SugarRoll.be Fact Sheet for Healthcare Providers: https://www.woods-mathews.com/ This test is not yet approved or cleared by the [place] FDA and  has been authorized for detection and/or diagnosis of SARS-CoV-2 by FDA under an Emergency Use Authorization (EUA). This EUA will remain  in effect (meaning this test can be used) for t he duration of the COVID-19 declaration under Section 564(b)(1) of the Act, 21 U.S.C. section 360bbb-3(b)(1), unless the authorization is terminated or revoked sooner. Performed at [facility], [place] [address], [place], [place] [address]   MRSA PCR Screening     Status: Abnormal   Collection Time: 04/12/19  4:38 AM   Specimen: Nasal Mucosa; Nasopharyngeal  Result Value Ref Range Status   MRSA by PCR POSITIVE (A) NEGATIVE Final    Comment:        The GeneXpert MRSA Assay (FDA approved for NASAL specimens only), is one component of a comprehensive MRSA colonization surveillance program. It  is not intended to diagnose MRSA infection nor to guide or monitor treatment for MRSA infections. RESULT CALLED TO, READ BACK BY AND VERIFIED WITH: Zack Seal RN 9:35 04/12/19 ([email or web address]) Performed at [facility], [place] [address], [place],  [address]   Culture, blood (routine x 2)     Status: None (Preliminary result)   Collection Time: 04/14/19  8:35 AM   Specimen: BLOOD  Result Value Ref Range Status   Specimen Description   Final    BLOOD LEFT ARM Performed at  [facility], [place] [address] [place], [place] [address]    Special Requests   Final    BOTTLES DRAWN AEROBIC AND ANAEROBIC Blood Culture adequate volume Performed at [place] [address], [place], [place] [address]    Culture   Final    NO GROWTH 3 DAYS Performed at [facility], [place] [address], [place], [place] [address]    Report Status PENDING  Incomplete  Culture, blood (routine x 2)     Status: None (Preliminary result)   Collection Time: 04/14/19  8:35 AM   Specimen: BLOOD LEFT HAND  Result Value Ref Range Status   Specimen Description   Final    BLOOD LEFT HAND Performed at [place] [address], [place], [place] [address]    Special Requests   Final    BOTTLES DRAWN AEROBIC ONLY Blood Culture adequate volume Performed at [place] [address] [place], [place] [address]    Culture   Final    NO GROWTH 3 DAYS Performed at [facility], [place] [address] [place], [place] [address]    Report Status PENDING  Incomplete         Radiology Studies: [place] EKG SITE RITE  Result Date: 04/16/2019 If Site Rite image not attached, placement could not be confirmed due to current cardiac rhythm.       Scheduled Meds: . buPROPion  300 mg Oral Daily  . Chlorhexidine Gluconate Cloth  6 each Topical Daily  . enoxaparin (LOVENOX) injection  30 mg Subcutaneous Q24H  . escitalopram  5 mg Oral  Daily  . Melatonin  3 mg Oral QHS  . multivitamin with minerals  1 tablet Oral Daily  . pantoprazole  40 mg Oral Daily  . pravastatin  20 mg Oral Daily  . saccharomyces boulardii  250 mg Oral BID   Continuous Infusions: . sodium chloride 75 mL/hr at 04/17/19 0547  . vancomycin 500 mg (04/16/19 2130)     LOS: 5 days   Time spent: 25 minutes  Arma Heading, MD [facility]  If 7PM-7AM, please contact night-coverage [email or web address] Password [email or web address] 04/17/2019, 2:42 PM

## 2019-04-17 NOTE — Progress Notes (Signed)
Spoke with PICC RN - patient to return to facility after PICC placed.  Anticipated PICC placement 04/18/2019 per PICC RN.

## 2019-04-18 LAB — COMPREHENSIVE METABOLIC PANEL
ALT: 24 U/L (ref 0–44)
AST: 32 U/L (ref 15–41)
Albumin: 2.5 g/dL — ABNORMAL LOW (ref 3.5–5.0)
Alkaline Phosphatase: 132 U/L — ABNORMAL HIGH (ref 38–126)
Anion gap: 10 (ref 5–15)
BUN: 24 mg/dL — ABNORMAL HIGH (ref 8–23)
CO2: 16 mmol/L — ABNORMAL LOW (ref 22–32)
Calcium: 8.7 mg/dL — ABNORMAL LOW (ref 8.9–10.3)
Chloride: 107 mmol/L (ref 98–111)
Creatinine, Ser: 1.31 mg/dL — ABNORMAL HIGH (ref 0.44–1.00)
GFR calc Af Amer: 49 mL/min — ABNORMAL LOW (ref >60)
GFR calc non Af Amer: 42 mL/min — ABNORMAL LOW (ref >60)
Glucose, Bld: 83 mg/dL (ref 70–99)
Potassium: 3.8 mmol/L (ref 3.5–5.1)
Sodium: 133 mmol/L — ABNORMAL LOW (ref 135–145)
Total Bilirubin: 0.3 mg/dL (ref 0.3–1.2)
Total Protein: 7.5 g/dL (ref 6.5–8.1)

## 2019-04-18 LAB — D-DIMER, QUANTITATIVE: D-Dimer, Quant: 1.18 ug/mL-FEU — ABNORMAL HIGH (ref 0.00–0.50)

## 2019-04-18 LAB — CBC
HCT: 26.6 % — ABNORMAL LOW (ref 36.0–46.0)
Hemoglobin: 8.2 g/dL — ABNORMAL LOW (ref 12.0–15.0)
MCH: 28.9 pg (ref 26.0–34.0)
MCHC: 30.8 g/dL (ref 30.0–36.0)
MCV: 93.7 fL (ref 80.0–100.0)
Platelets: 273 10*3/uL (ref 150–400)
RBC: 2.84 MIL/uL — ABNORMAL LOW (ref 3.87–5.11)
RDW: 14.8 % (ref 11.5–15.5)
WBC: 6.2 10*3/uL (ref 4.0–10.5)
nRBC: 0 % (ref 0.0–0.2)

## 2019-04-18 LAB — C-REACTIVE PROTEIN: CRP: 4.4 mg/dL — ABNORMAL HIGH (ref <1.0)

## 2019-04-18 LAB — FERRITIN: Ferritin: 622 ng/mL — ABNORMAL HIGH (ref 11–307)

## 2019-04-18 MED ORDER — SODIUM CHLORIDE 0.9% FLUSH: 10.0000 mL | Freq: Two times a day (BID) | INTRAVENOUS | Status: DC

## 2019-04-18 MED ORDER — VANCOMYCIN HCL 500 MG/100ML IV SOLN: 500.0000 mg | INTRAVENOUS | Status: AC

## 2019-04-18 MED ORDER — HEPARIN SOD (PORK) LOCK FLUSH 100 UNIT/ML IV SOLN
250.0000 [IU] | INTRAVENOUS | Status: AC | PRN
  Administered 2019-04-18: 15:00:00 250 [IU]

## 2019-04-18 MED ORDER — SODIUM CHLORIDE 0.9% FLUSH
10.0000 mL | INTRAVENOUS | Status: DC | PRN
  Administered 2019-04-18: 15:00:00 10 mL

## 2019-04-18 NOTE — Progress Notes (Signed)
This RN attempted to call report to [facility] facility x2 prior to patient's D/C. Second time, I provided my name and phone number to the secretary to have receiving RN call me back. Still awaiting return phone call. Pt ready for D/C, awaiting PTAR pickup at this time.

## 2019-04-18 NOTE — Discharge Summary (Signed)
Physician Discharge Summary  Katrina Boyer DGU:[id number] DOB: 10-26-1950 DOA: 04/12/2019  PCP: Seward Carol, MD  Admit date: 04/12/2019 Discharge date: 04/18/2019  Admitted From: SNF Disposition:  SNF  Recommendations for Outpatient Follow-up:  1. Follow up with PCP in 1-2 weeks 2. Resume home TPN or TNA per A[facility] protocol, please see below for previous hospitalization recommendations 3. Follow up with PCP in 7-10 days after discharge from SNF. 4. Continue IV Vancomycin at 544m daily as directed. Last day of therapy 04/20/2019. 5. Recheck CBC / BMP in 1 week 6. Continue isolation protocol for Covid 19, monitor for resolution of symptoms  Nutritional Goals (per RD recommendation on 10/21): [id number]: 80-95 Fluid:>/= 2.2 L/d   Plan:   start TPN at 40 mL/hr.  At goal (788m/hr)  this TPN provides 81 g of protein, 270  g of dextrose, and 36 g of lipids which provides 1602 kCals per day, meeting 100% of patient needs  Electrolytes in TPN: Na 5045m KCL 66m26mMag 5meq37m Ca 5meq/79mPhos 15mmol56mCl: Acetate ratio 1:1  Continue MVI, trace elements to TPN  TPN lab panels on Mondays & Thursdays.  F/U ability to advance to goal  Home Health: NO Equipment/Devices: No  Discharge Condition:Stable CODE STATUS:FULL Diet recommendation: NPO  Brief/Interim Summary: 67 year62ld female with history of severe chronic Crohn's disease with fistulas, multiple surgeries, colectomy, end ileostomy, on TPN, recurrent C. difficile colitis, anxiety, history of line infections was sent from [facility] overnight with reports of positive blood cultures,ED triage note says gram-positive cocci in her left arm,she is bed/wheelchair-bound at baseline. She was report that both sets of blood cultures with all 4 bottles were positive for staph epidermidis, this was sensitive to vancomycin, linezolid, tigecycline, tetracycline and rifampin, intermediate to moxifloxacin and resistant to  ciprofloxacin and oxacillin. Was started on IV vancomcyin. ID was called and discussed(Dr. VanDam1/1 by Dr Joseph)Broadus Johnecommended removal of PICC line, treatment with IV vancomycin for 7 days after removal of picc line. PICC line was removed and repeat blood cultures were negative. After repeat blood cultures were negative for greater than 48 hours, PICC line was replaced. Patient is day 6 since removal of picc line, day 5 since removal of blood cultures. Will treat till 04/20/19. Was scheduled to restart TPN today.   Discharge Diagnoses:  Principal Problem:   Crohn's disease (HCC) AcJohnson Sidinge Problems:   Pressure injury of skin   Discharge Instructions  Discharge Instructions    Call MD for:  persistant dizziness or light-headedness   Complete by: As directed    Call MD for:  persistant nausea and vomiting   Complete by: As directed    Call MD for:  temperature >100.4   Complete by: As directed    Home infusion instructions   Complete by: As directed    Instructions: Flushing of vascular access device: 0.9% NaCl pre/post medication administration and prn patency; Heparin 100 u/ml, 5ml for76mplanted ports and Heparin 10u/ml, 5ml for 79m other central venous catheters.     Allergies as of 04/18/2019      Reactions   Piperacillin-tazobactam In Dex    Thrombocytopenia   Metronidazole    neuropathy   Gabapentin    Mercaptopurine Nausea Only   Ciprofloxacin Rash      Medication List    STOP taking these medications   vancomycin 50 mg/mL  oral solution Commonly known as: VANCOCIN     TAKE these medications   acetaminophen 325 MG tablet Commonly known as:  TYLENOL Take 650 mg by mouth every 4 (four) hours as needed for mild pain.   albuterol (2.5 MG/3ML) 0.083% nebulizer solution Commonly known as: PROVENTIL Take 2.5 mg by nebulization every 4 (four) hours as needed for wheezing or shortness of breath.   buPROPion 300 MG 24 hr tablet Commonly known as: WELLBUTRIN XL Take 300  mg by mouth daily.   CALMOSEPTINE EX Apply 1 application topically 2 (two) times daily. Apply to groin/perineum   CEROVITE PO Take 1 tablet by mouth daily.   DERMACLOUD EX Apply 1 application topically 2 (two) times daily.   escitalopram 5 MG tablet Commonly known as: LEXAPRO Take 5 mg by mouth daily.   feeding supplement (PRO-STAT SUGAR FREE 64) Liqd Take 30 mLs by mouth daily.   hyoscyamine 0.125 MG SL tablet Commonly known as: LEVSIN SL Place 0.125 mg under the tongue every 4 (four) hours as needed for cramping.   lidocaine 5 % Commonly known as: LIDODERM Place 1 patch onto the skin daily. Remove & Discard patch within 12 hours or as directed by MD   liver oil-zinc oxide 40 % ointment Commonly known as: DESITIN Apply topically 2 (two) times daily as needed for irritation.   loperamide 2 MG capsule Commonly known as: IMODIUM Take 4 mg by mouth 4 (four) times daily as needed for diarrhea or loose stools.   Melatonin 3 MG Tabs Take 3 mg by mouth at bedtime.   methocarbamol 500 MG tablet Commonly known as: ROBAXIN Take 1 tablet (500 mg total) by mouth every 6 (six) hours as needed for muscle spasms.   multivitamin with minerals Tabs tablet Take 1 tablet by mouth daily.   Muscle Rub 10-15 % Crea Apply 1 application topically 3 (three) times daily as needed for muscle pain.   ondansetron 4 MG tablet Commonly known as: ZOFRAN Take 1 tablet (4 mg total) by mouth every 6 (six) hours as needed for nausea.   oxyCODONE 5 MG immediate release tablet Commonly known as: Oxy IR/ROXICODONE Take 1 tablet (5 mg total) by mouth every 4 (four) hours as needed for moderate pain. What changed: how much to take   pantoprazole 40 MG tablet Commonly known as: PROTONIX Take 40 mg by mouth daily.   pravastatin 20 MG tablet Commonly known as: PRAVACHOL Take 20 mg by mouth daily.   PRESCRIPTION MEDICATION PER MAR: Clinimix TPN IV with multivitamin additive 10 mL/1100 mL/16 Hours  infusion via CADD pump, add 10 MVI to each bag daily. New bag to be started daily at 12 pm, stop at 4 am   saccharomyces boulardii 250 MG capsule Commonly known as: FLORASTOR Take 250 mg by mouth 2 (two) times daily.   vancomycin 500 MG/100ML IVPB Commonly known as: VANCOREADY Inject 100 mLs (500 mg total) into the vein daily for 2 days.   zolpidem 5 MG tablet Commonly known as: AMBIEN Take 1 tablet (5 mg total) by mouth at bedtime as needed for sleep.            Home Infusion Instuctions  (From admission, onward)         Start     Ordered   04/18/19 0000  Home infusion instructions    Question:  Instructions  Answer:  Flushing of vascular access device: 0.9% NaCl pre/post medication administration and prn patency; Heparin 100 u/ml, 55m for implanted ports and Heparin 10u/ml, 555mfor all other central venous catheters.   04/18/19 1436  Allergies  Allergen Reactions  . Piperacillin-Tazobactam In Dex     Thrombocytopenia   . Metronidazole     neuropathy  . Gabapentin   . Mercaptopurine Nausea Only  . Ciprofloxacin Rash    Consultations:  N/A   Procedures/Studies: DG Chest 2 View  Result Date: 04/12/2019 CLINICAL DATA:  Evaluate for infection. Gram positive cocci. EXAM: CHEST - 2 VIEW COMPARISON:  Radiograph 01/24/2019 FINDINGS: Right upper extremity central line tip in the lower SVC. Unchanged heart size and mediastinal contours. No evidence of pneumonia. Minimal retrocardiac and right lower lung zone atelectasis. No pulmonary edema, pleural effusion, or pneumothorax. Mid to lower thoracic compression deformity. Degenerative changes of both shoulders. IMPRESSION: 1. No focal airspace disease or evidence of pneumonia. Minimal retrocardiac and right lower lung zone atelectasis. 2. Right upper extremity central line tip in the lower SVC. Electronically Signed   By: Keith Rake M.D.   On: 04/12/2019 03:17   [place] EKG SITE RITE  Result Date: 04/16/2019 If  Site Rite image not attached, placement could not be confirmed due to current cardiac rhythm.  (Echo, Carotid, EGD, Colonoscopy, ERCP)    Subjective: No acute events overnight. PICC line placed this morning. No complications. Patient doing well without concerns at this time. Is eager to leave the hospital. Afebrile, asymptomatic from a covid standpoint.   Discharge Exam: Vitals:   04/18/19 0639 04/18/19 1317  BP: 105/65 130/85  Pulse: 78 74  Resp: 16 19  Temp: 98.6 F (37 C) 98.1 F (36.7 C)  SpO2: 96% 97%   Vitals:   04/17/19 1238 04/17/19 2036 04/18/19 0639 04/18/19 1317  BP: 122/75 119/69 105/65 130/85  Pulse: 76 78 78 74  Resp: 17 18 16 19   Temp: 98.6 F (37 C) 98.8 F (37.1 C) 98.6 F (37 C) 98.1 F (36.7 C)  TempSrc: Oral Oral Oral Oral  SpO2: 96% 96% 96% 97%  Weight:      Height:        General exam: Appears calm and comfortable, lying in bed, exam unchanged from prior Respiratory system: Clear to auscultation. Respiratory effort normal. Cardiovascular system: S1 & S2 heard, RRR.  Gastrointestinal system: Abdomen is nondistended, soft and nontender. No organomegaly or masses felt. Normal bowel sounds heard. Central nervous system: Alert and oriented. No focal neurological deficits. Extremities: Symmetric 5 x 5 power. Wheelchair bound at baseline Skin: No rashes, lesions or ulcers Psychiatry: Judgement and insight appear normal. Mood & affect appropriate.  The results of significant diagnostics from this hospitalization (including imaging, microbiology, ancillary and laboratory) are listed below for reference.     Microbiology: Recent Results (from the past 240 hour(s))  Blood culture (routine x 2)     Status: None   Collection Time: 04/12/19  1:01 AM   Specimen: BLOOD  Result Value Ref Range Status   Specimen Description   Final    BLOOD BLOOD RIGHT FOREARM Performed at [facility] [address] [place], [place] [address]     Special Requests   Final    BOTTLES DRAWN AEROBIC AND ANAEROBIC Blood Culture adequate volume Performed at [facility] [address] [place], [place] [address]    Culture   Final    NO GROWTH 5 DAYS Performed at [facility], [place] [address], [place], [place] [address]    Report Status 04/17/2019 FINAL  Final  Blood culture (routine x 2)     Status: None   Collection Time: 04/12/19  1:10 AM   Specimen: BLOOD  Result Value Ref Range Status   Specimen Description   Final    BLOOD BLOOD LEFT FOREARM Performed at [facility] [address] [place], [place] [address]    Special Requests   Final    BOTTLES DRAWN AEROBIC AND ANAEROBIC Blood Culture adequate volume Performed at [facility] [address], [place], [place] [address]    Culture   Final    NO GROWTH 5 DAYS Performed at [facility], [place] [address] [place], [place] [address]    Report Status 04/17/2019 FINAL  Final  SARS CORONAVIRUS 2 (TAT 6-24 HRS) Nasopharyngeal Nasopharyngeal Swab     Status: Abnormal   Collection Time: 04/12/19  2:45 AM   Specimen: Nasopharyngeal Swab  Result Value Ref Range Status   SARS Coronavirus 2 POSITIVE (A) NEGATIVE Final    Comment: RESULT CALLED TO, READ BACK BY AND VERIFIED WITH: Zack Seal RN 15:30 04/12/19 ([email or web address]) (NOTE) SARS-CoV-2 target nucleic acids are DETECTED. The SARS-CoV-2 RNA is generally detectable in upper and lower respiratory specimens during the acute phase of infection. Positive results are indicative of the presence of SARS-CoV-2 RNA. Clinical correlation with patient history and other diagnostic information is  necessary to determine patient infection status. Positive results do not rule out bacterial infection or co-infection with other viruses.  The expected result is Negative. Fact Sheet for Patients: SugarRoll.be Fact Sheet for Healthcare  Providers: https://www.woods-mathews.com/ This test is not yet approved or cleared by the [place] FDA and  has been authorized for detection and/or diagnosis of SARS-CoV-2 by FDA under an Emergency Use Authorization (EUA). This EUA will remain  in effect (meaning this test can be used) for t he duration of the COVID-19 declaration under Section 564(b)(1) of the Act, 21 U.S.C. section 360bbb-3(b)(1), unless the authorization is terminated or revoked sooner. Performed at [facility], [place] [address] [place], [place] [address]   MRSA PCR Screening     Status: Abnormal   Collection Time: 04/12/19  4:38 AM   Specimen: Nasal Mucosa; Nasopharyngeal  Result Value Ref Range Status   MRSA by PCR POSITIVE (A) NEGATIVE Final    Comment:        The GeneXpert MRSA Assay (FDA approved for NASAL specimens only), is one component of a comprehensive MRSA colonization surveillance program. It is not intended to diagnose MRSA infection nor to guide or monitor treatment for MRSA infections. RESULT CALLED TO, READ BACK BY AND VERIFIED WITH: Zack Seal RN 9:35 04/12/19 ([email or web address]) Performed at [facility], [place] [address] [place], [place] [address]   Culture, blood (routine x 2)     Status: None (Preliminary result)   Collection Time: 04/14/19  8:35 AM   Specimen: BLOOD  Result Value Ref Range Status   Specimen Description   Final    BLOOD LEFT ARM Performed at [facility] [address] [place], [place] [address]    Special Requests   Final    BOTTLES DRAWN AEROBIC AND ANAEROBIC Blood Culture adequate volume Performed at [facility] [address] [place], [place] [address]    Culture   Final    NO GROWTH 4 DAYS Performed at [facility], [place] [address], [place], [place] [address]    Report Status PENDING  Incomplete  Culture, blood (routine x 2)     Status: None (Preliminary result)   Collection Time: 04/14/19  8:35 AM   Specimen: BLOOD LEFT HAND  Result Value Ref Range Status   Specimen Description   Final    BLOOD LEFT HAND Performed at [facility] [address], [place], [place] [address]    Special Requests   Final    BOTTLES DRAWN AEROBIC ONLY Blood Culture adequate volume Performed at [facility] [address], [place], [place] [address]    Culture   Final    NO GROWTH 4 DAYS Performed at [facility], [place] [address] [place], [place] [address]    Report Status PENDING  Incomplete     Labs: BNP (last 3 results) No results for input(s): BNP in the last 8760 hours. Basic Metabolic Panel: Recent Labs  Lab 04/13/19 0345 04/14/19 0219 04/15/19 0402 04/16/19 0443 04/17/19 0030 04/18/19 0254  NA 133* 131*  --  133* 131* 133*  K 4.1 3.9  --  3.9 4.0 3.8  CL 101 100  --  107 106 107  CO2 23 19*  --  18* 16* 16*  GLUCOSE 92 83  --  89 91 83  BUN 27* 25*  --  24* 23 24*  CREATININE 1.22* 1.28* 1.32* 1.44* 1.38* 1.31*  CALCIUM 9.0 8.6*  --  8.9 8.7* 8.7*   Liver Function Tests: Recent Labs  Lab 04/12/19 0101 04/13/19 0345 04/16/19 0443 04/17/19 0030 04/18/19 0254  AST 28 36 40 36 32  ALT 20 25 29 29 24   ALKPHOS 140* 136* 133* 133* 132*  BILITOT 0.5 0.5 0.2* 0.6 0.3  PROT 7.6 7.4 7.6 7.3 7.5  ALBUMIN 2.5* 2.3* 2.5* 2.5* 2.5*   No results for input(s): LIPASE, AMYLASE in the last 168 hours. No results for input(s): AMMONIA in the last 168 hours. CBC: Recent Labs  Lab 04/12/19 0101 04/13/19 0345 04/14/19 0219 04/16/19 0443 04/17/19 0030 04/18/19 0254  WBC 6.1 5.3 5.4 7.0 6.4 6.2  NEUTROABS 3.6  --   --   --   --   --   HGB 8.3* 7.8* 8.1* 8.1* 7.8* 8.2*  HCT 26.9* 25.2* 26.6* 26.3* 25.3* 26.6*  MCV 95.4 94.7 95.0 93.9 95.1 93.7  PLT 238 215 221 232 226 273   Cardiac Enzymes: No results for input(s): CKTOTAL, CKMB, CKMBINDEX, TROPONINI in the last 168 hours. BNP: Invalid input(s): POCBNP CBG: Recent Labs   Lab 04/16/19 2136  GLUCAP 83   D-Dimer Recent Labs    04/17/19 0030 04/18/19 0254  DDIMER 1.08* 1.18*   Hgb A1c No results for input(s): HGBA1C in the last 72 hours. Lipid Profile No results for input(s): CHOL, HDL, LDLCALC, TRIG, CHOLHDL, LDLDIRECT in the last 72 hours. Thyroid function studies No results for input(s): TSH, T4TOTAL, T3FREE, THYROIDAB in the last 72 hours.  Invalid input(s): FREET3 Anemia work up Recent Labs    04/17/19 0030 04/18/19 0254  FERRITIN 743* 622*   Urinalysis    Component Value Date/Time   COLORURINE STRAW (A) 02/26/2019 1807   APPEARANCEUR TURBID (A) 02/26/2019 1807   LABSPEC 1.011 02/26/2019 1807   PHURINE 6.0 02/26/2019 1807   GLUCOSEU NEGATIVE 02/26/2019 1807   HGBUR LARGE (A) 02/26/2019 1807   BILIRUBINUR NEGATIVE 02/26/2019 1807   KETONESUR 5 (A) 02/26/2019 1807   PROTEINUR 100 (A) 02/26/2019 1807   NITRITE POSITIVE (A) 02/26/2019 1807   LEUKOCYTESUR TRACE (A) 02/26/2019 1807   Sepsis Labs Invalid input(s): PROCALCITONIN,  WBC,  LACTICIDVEN Microbiology Recent Results (from the past 240 hour(s))  Blood  culture (routine x 2)     Status: None   Collection Time: 04/12/19  1:01 AM   Specimen: BLOOD  Result Value Ref Range Status   Specimen Description   Final    BLOOD BLOOD RIGHT FOREARM Performed at [facility] [address], [place], [place] [address]    Special Requests   Final    BOTTLES DRAWN AEROBIC AND ANAEROBIC Blood Culture adequate volume Performed at [facility] [address] [place], [place] [address]    Culture   Final    NO GROWTH 5 DAYS Performed at [facility], [place] [address], [place], [place] [address]    Report Status 04/17/2019 FINAL  Final  Blood culture (routine x 2)     Status: None   Collection Time: 04/12/19  1:10 AM   Specimen: BLOOD  Result Value Ref Range Status   Specimen Description   Final    BLOOD BLOOD LEFT FOREARM Performed at [facility] [address] [place], [place] [address]    Special Requests   Final    BOTTLES DRAWN AEROBIC AND ANAEROBIC Blood Culture adequate volume Performed at [facility] [address] [place], [place] [address]    Culture   Final    NO GROWTH 5 DAYS Performed at [facility], [place] [address], [place], [place] [address]    Report Status 04/17/2019 FINAL  Final  SARS CORONAVIRUS 2 (TAT 6-24 HRS) Nasopharyngeal Nasopharyngeal Swab     Status: Abnormal   Collection Time: 04/12/19  2:45 AM   Specimen: Nasopharyngeal Swab  Result Value Ref Range Status   SARS Coronavirus 2 POSITIVE (A) NEGATIVE Final    Comment: RESULT CALLED TO, READ BACK BY AND VERIFIED WITH: Zack Seal RN 15:30 04/12/19 ([email or web address]) (NOTE) SARS-CoV-2 target nucleic acids are DETECTED. The SARS-CoV-2 RNA is generally detectable in upper and lower respiratory specimens during the acute phase of infection. Positive results are indicative of the presence of SARS-CoV-2 RNA. Clinical correlation with patient history and other diagnostic information is  necessary to determine patient infection status. Positive results do not rule out bacterial infection or co-infection with other viruses.  The expected result is Negative. Fact Sheet for Patients: SugarRoll.be Fact Sheet for Healthcare Providers: https://www.woods-mathews.com/ This test is not yet approved or cleared by the [place] FDA and  has been authorized for detection and/or diagnosis of SARS-CoV-2 by FDA under an Emergency Use Authorization (EUA). This EUA will remain  in effect (meaning this test can be used) for t he duration of the COVID-19 declaration under Section 564(b)(1) of the Act, 21 U.S.C. section 360bbb-3(b)(1), unless the authorization is terminated or revoked sooner. Performed at [facility], [place] [address] [place], [place] [address]   MRSA PCR  Screening     Status: Abnormal   Collection Time: 04/12/19  4:38 AM   Specimen: Nasal Mucosa; Nasopharyngeal  Result Value Ref Range Status   MRSA by PCR POSITIVE (A) NEGATIVE Final    Comment:        The GeneXpert MRSA Assay (FDA approved for NASAL specimens only), is one component of a comprehensive MRSA colonization surveillance program. It is not intended to diagnose MRSA infection nor to guide or monitor treatment for MRSA infections. RESULT CALLED TO, READ BACK BY AND VERIFIED WITH: Zack Seal RN 9:35 04/12/19 ([email or web address]) Performed at [facility], [place] [address] [place], [place] [address]   Culture,  blood (routine x 2)     Status: None (Preliminary result)   Collection Time: 04/14/19  8:35 AM   Specimen: BLOOD  Result Value Ref Range Status   Specimen Description   Final    BLOOD LEFT ARM Performed at [facility] [address] [place], [place] [address]    Special Requests   Final    BOTTLES DRAWN AEROBIC AND ANAEROBIC Blood Culture adequate volume Performed at [facility] [address] [place], [place] [address]    Culture   Final    NO GROWTH 4 DAYS Performed at [facility], [place] [address] [place], [place] [address]    Report Status PENDING  Incomplete  Culture, blood (routine x 2)     Status: None (Preliminary result)   Collection Time: 04/14/19  8:35 AM   Specimen: BLOOD LEFT HAND  Result Value Ref Range Status   Specimen Description   Final    BLOOD LEFT HAND Performed at [facility] [address] [place], [place] [address]    Special Requests   Final    BOTTLES DRAWN AEROBIC ONLY Blood Culture adequate volume Performed at [facility] [address] [place], [place] [address]    Culture   Final    NO GROWTH 4 DAYS Performed at [facility], [place] [address], [place], [place] [address]    Report Status PENDING  Incomplete     Time coordinating  discharge: Over 30 minutes  SIGNED:   Arma Heading, MD  [facility] 04/18/2019, 2:47 PM Pager   If 7PM-7AM, please contact night-coverage [email or web address] Password [email or web address]

## 2019-04-18 NOTE — TOC Progression Note (Signed)
Transition of Care [facility]) - Progression Note    Patient Details  Name: Katrina Boyer MRN: [id number] Date of Birth: 1950-11-24  [facility] CM/SW Contact  Purcell Mouton, RN Phone Number: 04/18/2019, 2:57 PM  Clinical Narrative:    Pt discharged back to [facility].        Expected Discharge Plan and Services           Expected Discharge Date: 04/18/19                                     Social Determinants of Health (SDOH) Interventions    Readmission Risk Interventions No flowsheet data found.

## 2019-04-18 NOTE — Progress Notes (Signed)
Agreed with previous shift assessment. Pt was discharged via [facility]

## 2019-04-18 NOTE — Progress Notes (Signed)
Nutrition Brief Note RD working remotely.   Consult for new TPN received today at 1320 and Discharge Order and Discharge Summary entered today at 1450 for patient to return to SNF.   Wt Readings from Last 15 Encounters:  04/12/19 44 kg  02/26/19 44 kg  01/31/19 47.6 kg    Body mass index is 16.64 kg/m. Patient meets criteria for underweight based on current BMI. Suspect some degree of malnutrition, but unable to confirm as patient is COVID+.   Current diet order is Regular, patient is consuming approximately 0-25% of meals at this time.   Single lumen PICC placed in L brachial this AM. No TPN order has been placed during hospitalization (LOS day #6). Patient was admitted for bacteremia/line infection, so PICC placement today was a replacement of line that was previously removed d/t infection.  Patient is familiar to this RD from previous hospitalizations and is on TPN at SNF and has been on TPN for at least the past 3 months.   Labs and medications reviewed.  No nutrition interventions warranted at this time. If patient is unable to return to SNF today, RD will complete full assessment note tomorrow (1/7).     Jarome Matin, MS, RD, LDN, [facility] Inpatient Clinical Dietitian Pager # [phone] After hours/weekend pager # [phone]

## 2019-04-18 NOTE — Progress Notes (Signed)
Peripherally Inserted Central Catheter/Midline Placement  The IV Nurse has discussed with the patient and/or persons authorized to consent for the patient, the purpose of this procedure and the potential benefits and risks involved with this procedure.  The benefits include less needle sticks, lab draws from the catheter, and the patient may be discharged home with the catheter. Risks include, but not limited to, infection, bleeding, blood clot (thrombus formation), and puncture of an artery; nerve damage and irregular heartbeat and possibility to perform a PICC exchange if needed/ordered by physician.  Alternatives to this procedure were also discussed.  Bard Power PICC patient education guide, fact sheet on infection prevention and patient information card has been provided to patient /or left at bedside.    PICC/Midline Placement Documentation  PICC Single Lumen 04/18/19 PICC Left Brachial 38 cm 0 cm (Active)  Indication for Insertion or Continuance of Line Home intravenous therapies (PICC only) 04/18/19 0900  Exposed Catheter (cm) 0 cm 04/18/19 0900  Site Assessment Dry;Clean;Intact 04/18/19 0900  Line Status Flushed;Saline locked;Blood return noted 04/18/19 0900  Dressing Type Transparent;Securing device 04/18/19 0900  Dressing Status Clean;Dry;Intact;Antimicrobial disc in place 04/18/19 0900  Dressing Change Due 04/25/19 04/18/19 0900       Holley Bouche Renee 04/18/2019, 9:30 AM

## 2019-04-19 LAB — CULTURE, BLOOD (ROUTINE X 2)
Culture: NO GROWTH
Culture: NO GROWTH
Special Requests: ADEQUATE
Special Requests: ADEQUATE

## 2019-07-03 ENCOUNTER — Inpatient Hospital Stay (HOSPITAL_COMMUNITY): Payer: Medicare Other

## 2019-07-03 ENCOUNTER — Inpatient Hospital Stay (HOSPITAL_COMMUNITY)
Admission: EM | Admit: 2019-07-03 | Discharge: 2019-07-12 | DRG: 871 | Disposition: E | Payer: Medicare Other | Source: Skilled Nursing Facility | Attending: Internal Medicine | Admitting: Internal Medicine

## 2019-07-03 ENCOUNTER — Encounter (HOSPITAL_COMMUNITY): Payer: Self-pay | Admitting: Emergency Medicine

## 2019-07-03 ENCOUNTER — Other Ambulatory Visit: Payer: Self-pay

## 2019-07-03 ENCOUNTER — Emergency Department (HOSPITAL_COMMUNITY): Payer: Medicare Other

## 2019-07-03 DIAGNOSIS — G4700 Insomnia, unspecified: Secondary | ICD-10-CM | POA: Diagnosis present

## 2019-07-03 DIAGNOSIS — N179 Acute kidney failure, unspecified: Secondary | ICD-10-CM | POA: Diagnosis present

## 2019-07-03 DIAGNOSIS — Z515 Encounter for palliative care: Secondary | ICD-10-CM | POA: Diagnosis not present

## 2019-07-03 DIAGNOSIS — N390 Urinary tract infection, site not specified: Secondary | ICD-10-CM | POA: Diagnosis present

## 2019-07-03 DIAGNOSIS — A419 Sepsis, unspecified organism: Principal | ICD-10-CM | POA: Diagnosis present

## 2019-07-03 DIAGNOSIS — K219 Gastro-esophageal reflux disease without esophagitis: Secondary | ICD-10-CM | POA: Diagnosis present

## 2019-07-03 DIAGNOSIS — Z8674 Personal history of sudden cardiac arrest: Secondary | ICD-10-CM

## 2019-07-03 DIAGNOSIS — Z888 Allergy status to other drugs, medicaments and biological substances status: Secondary | ICD-10-CM

## 2019-07-03 DIAGNOSIS — G9341 Metabolic encephalopathy: Secondary | ICD-10-CM | POA: Diagnosis not present

## 2019-07-03 DIAGNOSIS — E669 Obesity, unspecified: Secondary | ICD-10-CM | POA: Diagnosis present

## 2019-07-03 DIAGNOSIS — Z880 Allergy status to penicillin: Secondary | ICD-10-CM

## 2019-07-03 DIAGNOSIS — D631 Anemia in chronic kidney disease: Secondary | ICD-10-CM | POA: Diagnosis present

## 2019-07-03 DIAGNOSIS — F419 Anxiety disorder, unspecified: Secondary | ICD-10-CM | POA: Diagnosis present

## 2019-07-03 DIAGNOSIS — L89152 Pressure ulcer of sacral region, stage 2: Secondary | ICD-10-CM | POA: Diagnosis present

## 2019-07-03 DIAGNOSIS — E1122 Type 2 diabetes mellitus with diabetic chronic kidney disease: Secondary | ICD-10-CM | POA: Diagnosis present

## 2019-07-03 DIAGNOSIS — K50113 Crohn's disease of large intestine with fistula: Secondary | ICD-10-CM | POA: Diagnosis present

## 2019-07-03 DIAGNOSIS — Z66 Do not resuscitate: Secondary | ICD-10-CM | POA: Diagnosis not present

## 2019-07-03 DIAGNOSIS — Z79891 Long term (current) use of opiate analgesic: Secondary | ICD-10-CM

## 2019-07-03 DIAGNOSIS — Z20822 Contact with and (suspected) exposure to covid-19: Secondary | ICD-10-CM | POA: Diagnosis present

## 2019-07-03 DIAGNOSIS — N1831 Chronic kidney disease, stage 3a: Secondary | ICD-10-CM | POA: Diagnosis present

## 2019-07-03 DIAGNOSIS — J9601 Acute respiratory failure with hypoxia: Secondary | ICD-10-CM | POA: Diagnosis not present

## 2019-07-03 DIAGNOSIS — Z6820 Body mass index (BMI) 20.0-20.9, adult: Secondary | ICD-10-CM

## 2019-07-03 DIAGNOSIS — R64 Cachexia: Secondary | ICD-10-CM | POA: Diagnosis present

## 2019-07-03 DIAGNOSIS — E860 Dehydration: Secondary | ICD-10-CM | POA: Diagnosis present

## 2019-07-03 DIAGNOSIS — Z79899 Other long term (current) drug therapy: Secondary | ICD-10-CM

## 2019-07-03 DIAGNOSIS — F329 Major depressive disorder, single episode, unspecified: Secondary | ICD-10-CM | POA: Diagnosis present

## 2019-07-03 DIAGNOSIS — K50118 Crohn's disease of large intestine with other complication: Secondary | ICD-10-CM | POA: Diagnosis present

## 2019-07-03 DIAGNOSIS — F05 Delirium due to known physiological condition: Secondary | ICD-10-CM | POA: Diagnosis not present

## 2019-07-03 DIAGNOSIS — Z8719 Personal history of other diseases of the digestive system: Secondary | ICD-10-CM

## 2019-07-03 DIAGNOSIS — D696 Thrombocytopenia, unspecified: Secondary | ICD-10-CM | POA: Diagnosis not present

## 2019-07-03 DIAGNOSIS — R41841 Cognitive communication deficit: Secondary | ICD-10-CM

## 2019-07-03 DIAGNOSIS — Z79890 Hormone replacement therapy (postmenopausal): Secondary | ICD-10-CM

## 2019-07-03 DIAGNOSIS — Z8619 Personal history of other infectious and parasitic diseases: Secondary | ICD-10-CM

## 2019-07-03 DIAGNOSIS — K5090 Crohn's disease, unspecified, without complications: Secondary | ICD-10-CM | POA: Diagnosis present

## 2019-07-03 DIAGNOSIS — Z881 Allergy status to other antibiotic agents status: Secondary | ICD-10-CM

## 2019-07-03 DIAGNOSIS — E43 Unspecified severe protein-calorie malnutrition: Secondary | ICD-10-CM | POA: Diagnosis present

## 2019-07-03 DIAGNOSIS — E785 Hyperlipidemia, unspecified: Secondary | ICD-10-CM | POA: Diagnosis present

## 2019-07-03 DIAGNOSIS — R6520 Severe sepsis without septic shock: Secondary | ICD-10-CM

## 2019-07-03 DIAGNOSIS — E876 Hypokalemia: Secondary | ICD-10-CM | POA: Diagnosis present

## 2019-07-03 DIAGNOSIS — K50112 Crohn's disease of large intestine with intestinal obstruction: Secondary | ICD-10-CM | POA: Diagnosis present

## 2019-07-03 DIAGNOSIS — R6521 Severe sepsis with septic shock: Secondary | ICD-10-CM | POA: Diagnosis not present

## 2019-07-03 DIAGNOSIS — D649 Anemia, unspecified: Secondary | ICD-10-CM | POA: Diagnosis present

## 2019-07-03 DIAGNOSIS — R5383 Other fatigue: Secondary | ICD-10-CM | POA: Diagnosis present

## 2019-07-03 DIAGNOSIS — B965 Pseudomonas (aeruginosa) (mallei) (pseudomallei) as the cause of diseases classified elsewhere: Secondary | ICD-10-CM | POA: Diagnosis present

## 2019-07-03 DIAGNOSIS — N138 Other obstructive and reflux uropathy: Secondary | ICD-10-CM | POA: Diagnosis present

## 2019-07-03 DIAGNOSIS — Z95828 Presence of other vascular implants and grafts: Secondary | ICD-10-CM

## 2019-07-03 DIAGNOSIS — K56609 Unspecified intestinal obstruction, unspecified as to partial versus complete obstruction: Secondary | ICD-10-CM

## 2019-07-03 DIAGNOSIS — K50119 Crohn's disease of large intestine with unspecified complications: Secondary | ICD-10-CM | POA: Diagnosis not present

## 2019-07-03 DIAGNOSIS — R0600 Dyspnea, unspecified: Secondary | ICD-10-CM

## 2019-07-03 DIAGNOSIS — Z87891 Personal history of nicotine dependence: Secondary | ICD-10-CM

## 2019-07-03 DIAGNOSIS — Z87440 Personal history of urinary (tract) infections: Secondary | ICD-10-CM

## 2019-07-03 LAB — COMPREHENSIVE METABOLIC PANEL
ALT: 27 U/L (ref 0–44)
AST: 42 U/L — ABNORMAL HIGH (ref 15–41)
Albumin: 1.2 g/dL — ABNORMAL LOW (ref 3.5–5.0)
Alkaline Phosphatase: 178 U/L — ABNORMAL HIGH (ref 38–126)
Anion gap: 12 (ref 5–15)
BUN: 33 mg/dL — ABNORMAL HIGH (ref 8–23)
CO2: 21 mmol/L — ABNORMAL LOW (ref 22–32)
Calcium: 8.1 mg/dL — ABNORMAL LOW (ref 8.9–10.3)
Chloride: 104 mmol/L (ref 98–111)
Creatinine, Ser: 1.43 mg/dL — ABNORMAL HIGH (ref 0.44–1.00)
GFR calc Af Amer: 44 mL/min — ABNORMAL LOW (ref >60)
GFR calc non Af Amer: 38 mL/min — ABNORMAL LOW (ref >60)
Glucose, Bld: 124 mg/dL — ABNORMAL HIGH (ref 70–99)
Potassium: 2.6 mmol/L — CL (ref 3.5–5.1)
Sodium: 137 mmol/L (ref 135–145)
Total Bilirubin: 0.9 mg/dL (ref 0.3–1.2)
Total Protein: 7 g/dL (ref 6.5–8.1)

## 2019-07-03 LAB — URINALYSIS, ROUTINE W REFLEX MICROSCOPIC
Bilirubin Urine: NEGATIVE
Glucose, UA: NEGATIVE mg/dL
Ketones, ur: NEGATIVE mg/dL
Nitrite: NEGATIVE
Protein, ur: 100 mg/dL — AB
Specific Gravity, Urine: 1.01 (ref 1.005–1.030)
WBC, UA: >50 WBC/hpf — ABNORMAL HIGH (ref 0–5)
pH: 7 (ref 5.0–8.0)

## 2019-07-03 LAB — LACTIC ACID, PLASMA
Lactic Acid, Venous: 3.6 mmol/L (ref 0.5–1.9)
Lactic Acid, Venous: 3.7 mmol/L (ref 0.5–1.9)

## 2019-07-03 LAB — CBC
HCT: 24.3 % — ABNORMAL LOW (ref 36.0–46.0)
Hemoglobin: 7 g/dL — ABNORMAL LOW (ref 12.0–15.0)
MCH: 26.1 pg (ref 26.0–34.0)
MCHC: 28.8 g/dL — ABNORMAL LOW (ref 30.0–36.0)
MCV: 90.7 fL (ref 80.0–100.0)
Platelets: 315 10*3/uL (ref 150–400)
RBC: 2.68 MIL/uL — ABNORMAL LOW (ref 3.87–5.11)
RDW: 16.1 % — ABNORMAL HIGH (ref 11.5–15.5)
WBC: 8.5 10*3/uL (ref 4.0–10.5)
nRBC: 0 % (ref 0.0–0.2)

## 2019-07-03 LAB — PROTIME-INR
INR: 1.4 — ABNORMAL HIGH (ref 0.8–1.2)
Prothrombin Time: 16.9 seconds — ABNORMAL HIGH (ref 11.4–15.2)

## 2019-07-03 LAB — ABO/RH: ABO/RH(D): O POS

## 2019-07-03 LAB — PREPARE RBC (CROSSMATCH)

## 2019-07-03 LAB — RESPIRATORY PANEL BY RT PCR (FLU A&B, COVID)
Influenza A by PCR: NEGATIVE
Influenza B by PCR: NEGATIVE
SARS Coronavirus 2 by RT PCR: NEGATIVE

## 2019-07-03 LAB — APTT: aPTT: 29 seconds (ref 24–36)

## 2019-07-03 LAB — BRAIN NATRIURETIC PEPTIDE: B Natriuretic Peptide: 308.9 pg/mL — ABNORMAL HIGH (ref 0.0–100.0)

## 2019-07-03 MED ORDER — HYDROMORPHONE HCL 1 MG/ML IJ SOLN: 0.5000 mg | INTRAMUSCULAR | Status: DC | PRN

## 2019-07-03 MED ORDER — SODIUM CHLORIDE 0.9 % IV SOLN: 2.0000 g | Freq: Once | INTRAVENOUS | Status: DC

## 2019-07-03 MED ORDER — SODIUM CHLORIDE 0.9 % IV BOLUS
2000.0000 mL | Freq: Once | INTRAVENOUS | Status: AC
  Administered 2019-07-03: 2000 mL via INTRAVENOUS

## 2019-07-03 MED ORDER — POTASSIUM CHLORIDE 10 MEQ/100ML IV SOLN
10.0000 meq | Freq: Once | INTRAVENOUS | Status: AC
  Administered 2019-07-03: 10 meq via INTRAVENOUS
  Filled 2019-07-03: qty 100

## 2019-07-03 MED ORDER — VANCOMYCIN HCL 500 MG/100ML IV SOLN
500.0000 mg | INTRAVENOUS | Status: DC
  Filled 2019-07-03: qty 100

## 2019-07-03 MED ORDER — ACETAMINOPHEN 325 MG PO TABS
650.0000 mg | ORAL_TABLET | Freq: Four times a day (QID) | ORAL | Status: DC | PRN
  Administered 2019-07-04 (×2): 650 mg via ORAL
  Filled 2019-07-03 (×2): qty 2

## 2019-07-03 MED ORDER — POTASSIUM CHLORIDE IN NACL 40-0.9 MEQ/L-% IV SOLN
INTRAVENOUS | Status: DC
  Administered 2019-07-03 – 2019-07-04 (×2): 125 mL/h via INTRAVENOUS
  Filled 2019-07-03 (×3): qty 1000

## 2019-07-03 MED ORDER — SODIUM CHLORIDE 0.9 % IV SOLN
2.0000 g | Freq: Once | INTRAVENOUS | Status: AC
  Administered 2019-07-03: 2 g via INTRAVENOUS
  Filled 2019-07-03: qty 2

## 2019-07-03 MED ORDER — ONDANSETRON HCL 4 MG PO TABS: 4.0000 mg | ORAL_TABLET | Freq: Four times a day (QID) | ORAL | Status: DC | PRN

## 2019-07-03 MED ORDER — ONDANSETRON HCL 4 MG/2ML IJ SOLN: 4.0000 mg | Freq: Four times a day (QID) | INTRAMUSCULAR | Status: DC | PRN

## 2019-07-03 MED ORDER — SODIUM CHLORIDE 0.9 % IV SOLN
2.0000 g | INTRAVENOUS | Status: DC
  Administered 2019-07-04: 2 g via INTRAVENOUS
  Filled 2019-07-03: qty 2

## 2019-07-03 MED ORDER — ACETAMINOPHEN 650 MG RE SUPP
650.0000 mg | Freq: Once | RECTAL | Status: AC
  Administered 2019-07-03: 650 mg via RECTAL
  Filled 2019-07-03: qty 1

## 2019-07-03 MED ORDER — VANCOMYCIN HCL IN DEXTROSE 1-5 GM/200ML-% IV SOLN
1000.0000 mg | Freq: Once | INTRAVENOUS | Status: AC
  Administered 2019-07-03: 1000 mg via INTRAVENOUS
  Filled 2019-07-03: qty 200

## 2019-07-03 MED ORDER — ACETAMINOPHEN 650 MG RE SUPP
650.0000 mg | Freq: Four times a day (QID) | RECTAL | Status: DC | PRN
  Administered 2019-07-07: 650 mg via RECTAL
  Filled 2019-07-03: qty 1

## 2019-07-03 MED ORDER — SODIUM CHLORIDE 0.9 % IV SOLN: 10.0000 mL/h | Freq: Once | INTRAVENOUS | Status: DC

## 2019-07-03 NOTE — ED Notes (Signed)
Admitting paged to RN per her request 

## 2019-07-03 NOTE — ED Notes (Signed)
Pt's sister Aris Everts updated on pt per pt's request

## 2019-07-03 NOTE — ED Notes (Signed)
NS 544m bolus infusing at this time

## 2019-07-03 NOTE — ED Notes (Signed)
Dr. Windle Guard in to assess pt.  Pt to CT at this time.

## 2019-07-03 NOTE — ED Notes (Signed)
Son at bedside.

## 2019-07-03 NOTE — ED Notes (Signed)
Dr. Windle Guard paged.

## 2019-07-03 NOTE — H&P (Addendum)
History and Physical    Katrina Boyer TDV:[id number] DOB: 10-03-1950 DOA: 06/20/2019  PCP: Seward Carol, MD  Patient coming from: [facility]  I have personally briefly reviewed patient's old medical records in [place]  Chief Complaint: Anemia  HPI: Katrina Boyer is a 69 y.o. female with medical history significant of Crohn's disease  with perianal involvement-on TPN, cognitive communication disorder?, CKD stage III, GERD, hyperlipidemia, insomnia brought by EMS from nursing home with complaint of low hemoglobin.  Patient is poor historian. She said" I do not remember" to most of my questions. history gathered from charts.  As per nursing home staff patient complained of being cold 2 days ago.  CBC was ordered which shows hemoglobin of 6.4.  She sent to ER for further evaluation and management of anemia.   Upon arrival: Patient seems very dehydrated, anemic.  She tells me she is unsure about melena or hematemesis.  Complaining of fever, generalized weakness and lethargic.  Has generalized abdominal pain associated with abdominal distention.  No history of vomiting or diarrhea, dysuria or hematuria.  Denies chest pain, shortness of breath, leg swelling, headache or blurry vision.  No history of smoking, alcohol, cigarette use.  Reviewed care everywhere documents: Patient was seen by general surgery on 3/16 at [facility].  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  Plan was to sigmoid resection and end colostomy versus loop colostomy however  Patient wanted to wait until she is out of nursing home before having surgery.  Upon arrival to ER: Patient had fever of 103, tachycardic, tachypneic, lactic acid of 3.6.  She empirically started on broad-spectrum antibiotics IV Vanco and cefepime.  H&H 7.0/24.3.  1 unit PRBC ordered.  Chest x-ray negative.  UA, blood culture, COVID-19 pending.  POC  occult blood pending.  Review of Systems: As per HPI otherwise negative.    Past Medical History:  Diagnosis Date  . Bacteremia   . Cognitive communication disorder   . Coordination abnormal   . Crohn disease ([place])   . Muscle weakness (generalized)   . Unsteadiness on feet     History reviewed. No pertinent surgical history.   reports that she has quit smoking. She has never used smokeless tobacco. She reports previous alcohol use. She reports that she does not use drugs.  Allergies  Allergen Reactions  . Piperacillin-Tazobactam In Dex     Thrombocytopenia   . Metronidazole     neuropathy  . Gabapentin   . Mercaptopurine Nausea Only  . Ciprofloxacin Rash    History reviewed. No pertinent family history.  Prior to Admission medications   Medication Sig Start Date End Date Taking? Authorizing Provider  acetaminophen (TYLENOL) 325 MG tablet Take 650 mg by mouth every 4 (four) hours as needed for mild pain.    [provider]  albuterol (PROVENTIL) (2.5 MG/3ML) 0.083% nebulizer solution Take 2.5 mg by nebulization every 4 (four) hours as needed for wheezing or shortness of breath.    [provider]  Amino Acids-Protein Hydrolys (FEEDING SUPPLEMENT, PRO-STAT SUGAR FREE 64,) LIQD Take 30 mLs by mouth daily. 02/01/19   Swayze, Ava, DO  buPROPion (WELLBUTRIN XL) 300 MG 24 hr tablet Take 300 mg by mouth daily.    [provider]  escitalopram (LEXAPRO) 5 MG tablet Take 5 mg by mouth daily.    [provider]  hyoscyamine (LEVSIN SL) 0.125 MG SL tablet Place 0.125 mg  under the tongue every 4 (four) hours as needed for cramping.    [provider]  Infant Care Products [facility] EX) Apply 1 application topically 2 (two) times daily.    [provider]  lidocaine (LIDODERM) 5 % Place 1 patch onto the skin daily. Remove & Discard patch within 12 hours or as directed by MD    [provider]  liver oil-zinc oxide (DESITIN)  40 % ointment Apply topically 2 (two) times daily as needed for irritation. Patient not taking: Reported on 02/26/2019 02/01/19   Swayze, Ava, DO  loperamide (IMODIUM) 2 MG capsule Take 4 mg by mouth 4 (four) times daily as needed for diarrhea or loose stools.    [provider]  Melatonin 3 MG TABS Take 3 mg by mouth at bedtime.    [provider]  Menthol-Methyl Salicylate (MUSCLE RUB) 10-15 % CREA Apply 1 application topically 3 (three) times daily as needed for muscle pain. 02/01/19   Swayze, Ava, DO  Menthol-Zinc Oxide (CALMOSEPTINE EX) Apply 1 application topically 2 (two) times daily. Apply to groin/perineum    [provider]  methocarbamol (ROBAXIN) 500 MG tablet Take 1 tablet (500 mg total) by mouth every 6 (six) hours as needed for muscle spasms. 02/01/19   Swayze, Ava, DO  Multiple Vitamin (MULTIVITAMIN WITH MINERALS) TABS tablet Take 1 tablet by mouth daily. Patient not taking: Reported on 02/26/2019 02/01/19   Swayze, Ava, DO  Multiple Vitamins-Minerals (CEROVITE PO) Take 1 tablet by mouth daily.    [provider]  ondansetron (ZOFRAN) 4 MG tablet Take 1 tablet (4 mg total) by mouth every 6 (six) hours as needed for nausea. 02/01/19   Swayze, Ava, DO  oxyCODONE (OXY IR/ROXICODONE) 5 MG immediate release tablet Take 1 tablet (5 mg total) by mouth every 4 (four) hours as needed for moderate pain. Patient taking differently: Take 10 mg by mouth every 4 (four) hours as needed for moderate pain.  02/01/19   Swayze, Ava, DO  pantoprazole (PROTONIX) 40 MG tablet Take 40 mg by mouth daily.    [provider]  pravastatin (PRAVACHOL) 20 MG tablet Take 20 mg by mouth daily.    [provider]  PRESCRIPTION MEDICATION PER MAR: Clinimix TPN IV with multivitamin additive 10 mL/1100 mL/16 Hours infusion via CADD pump, add 10 MVI to each bag daily. New bag to be started daily at 12 pm, stop at 4 am    [provider]  saccharomyces  boulardii (FLORASTOR) 250 MG capsule Take 250 mg by mouth 2 (two) times daily.    [provider]  zolpidem (AMBIEN) 5 MG tablet Take 1 tablet (5 mg total) by mouth at bedtime as needed for sleep. 02/01/19   Swayze, Ava, DO    Physical Exam: Vitals:   07/05/2019 1715 06/21/2019 1745 06/19/2019 1800 06/24/2019 1815  BP: (!) 120/97 121/61 (!) 110/54 (!) 101/46  Pulse: (!) 122 (!) 116 (!) 108 (!) 109  Resp: (!) 30 (!) 31 (!) 30 (!) 33  Temp:      TempSrc:      SpO2: 92% 91% 95% 97%    Constitutional: Appears weak, dehydrated and pale Eyes: PERRL, lids and conjunctivae: Pale  ENMT: Mucous membranes are moist. Posterior pharynx clear of any exudate or lesions.Normal dentition.  Neck: normal, supple, no masses, no thyromegaly Respiratory: clear to auscultation bilaterally, no wheezing, no crackles. Normal respiratory effort. No accessory muscle use.  Cardiovascular: Tachycardic, no murmurs / rubs / gallops. No  extremity edema. 2+ pedal pulses. No carotid bruits.  Abdomen: Distended, generalized abdominal tenderness positive., no masses palpated. No hepatosplenomegaly. Bowel sounds positive.  Musculoskeletal: no clubbing / cyanosis. No joint deformity upper and lower extremities. Good ROM, no contractures. Normal muscle tone.  Skin: no rashes, lesions, ulcers. No induration Neurologic: CN 2-12 grossly intact. Sensation intact, DTR normal. Strength 5/5 in all 4.  Psychiatric: Normal judgment and insight. Alert and oriented x 3. Normal mood.    Labs on Admission: I have personally reviewed following labs and imaging studies  CBC: Recent Labs  Lab 06/23/2019 1539  WBC 8.5  HGB 7.0*  HCT 24.3*  MCV 90.7  PLT 956   Basic Metabolic Panel: Recent Labs  Lab 06/14/2019 1539  NA 137  K 2.6*  CL 104  CO2 21*  GLUCOSE 124*  BUN 33*  CREATININE 1.43*  CALCIUM 8.1*   GFR: CrCl cannot be calculated (Unknown ideal weight.). Liver Function Tests: Recent Labs  Lab 07/10/2019 1539  AST  42*  ALT 27  ALKPHOS 178*  BILITOT 0.9  PROT 7.0  ALBUMIN 1.2*   No results for input(s): LIPASE, AMYLASE in the last 168 hours. No results for input(s): AMMONIA in the last 168 hours. Coagulation Profile: Recent Labs  Lab 07/10/2019 1712  INR 1.4*   Cardiac Enzymes: No results for input(s): CKTOTAL, CKMB, CKMBINDEX, TROPONINI in the last 168 hours. BNP (last 3 results) No results for input(s): PROBNP in the last 8760 hours. HbA1C: No results for input(s): HGBA1C in the last 72 hours. CBG: No results for input(s): GLUCAP in the last 168 hours. Lipid Profile: No results for input(s): CHOL, HDL, LDLCALC, TRIG, CHOLHDL, LDLDIRECT in the last 72 hours. Thyroid Function Tests: No results for input(s): TSH, T4TOTAL, FREET4, T3FREE, THYROIDAB in the last 72 hours. Anemia Panel: No results for input(s): VITAMINB12, FOLATE, FERRITIN, TIBC, IRON, RETICCTPCT in the last 72 hours. Urine analysis:    Component Value Date/Time   COLORURINE STRAW (A) 02/26/2019 1807   APPEARANCEUR TURBID (A) 02/26/2019 1807   LABSPEC 1.011 02/26/2019 1807   PHURINE 6.0 02/26/2019 1807   GLUCOSEU NEGATIVE 02/26/2019 1807   HGBUR LARGE (A) 02/26/2019 1807   BILIRUBINUR NEGATIVE 02/26/2019 1807   KETONESUR 5 (A) 02/26/2019 1807   PROTEINUR 100 (A) 02/26/2019 1807   NITRITE POSITIVE (A) 02/26/2019 1807   LEUKOCYTESUR TRACE (A) 02/26/2019 1807    Radiological Exams on Admission: DG Chest Port 1 View  Result Date: 06/27/2019 CLINICAL DATA:  Fever. EXAM: PORTABLE CHEST 1 VIEW COMPARISON:  April 12, 2019 FINDINGS: The right-sided PICC line seen on the prior study has been removed. A left-sided PICC line is seen with its distal tip noted near the junction of the superior vena cava and right atrium. Mild, chronic appearing increased lung markings are seen without evidence of acute infiltrate, pleural effusion or pneumothorax. The heart size and mediastinal contours are within normal limits. The visualized  skeletal structures are unremarkable. IMPRESSION: No active disease. Electronically Signed   By: Virgina Norfolk M.D.   On: 06/26/2019 17:27    Assessment/Plan Active Problems:   Crohn's disease ([place])   Sepsis ([place])   Hypokalemia   Cognitive communication disorder   Anemia   Sepsis due to unknown etiology: -could be bowel perforation?Patient presented with fever of 103, tachycardia, tachypnea, elevated lactic acid. -Initial work-up: No leukocytosis, chest x-ray negative.  UA, blood culture pending.  COVID-19 pending -Received IV fluid bolus, IV Vanco and cefepime in ED. -Admit patient to  stepdown unit for close monitoring. -Continue IV fluids and Vanco and cefepime. -Patient has distended abdomen with generalized tenderness.  We will get stat CT abdomen/pelvis to rule out perforation versus obstruction. -I talked to general surgery on-call Dr. Kym Groom will come and evaluate the patient. -We will keep her n.p.o.  Zofran as needed for nausea and vomiting.  Dilaudid as needed for pain control.  Acute on chronic anemia: -Patient's H&H is 7.0/24.3-was 8.2/26.62 months ago. -POC occult is pending. -Transfuse 1 unit of PRBC.  Monitor H&H closely.  Hypokalemia: -Replenished.  Will get stat EKG -Check magnesium level.  Repeat BMP tomorrow a.m.  AKI on CKD 3: -Creatinine: 1.43, GFR: 38. (Baseline creatinine 1.31, GFR: 42) -Continue IV fluids.  Repeat BMP tomorrow AM.  Avoid nephrotoxic medication.  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  No signs of bleeding or infection noted around the PICC line.  Hyperlipidemia: Hold p.o. statin  GERD: Hold PPI  Elevated liver enzymes:  -We will get CT abdomen/pelvis.  Continue to monitor.  Patient is at high risk of morbidity and mortality.  Unable to safely start patient's home medicines as med reconciliation is pending by  pharmacy.  DVT prophylaxis: SCD/TED Code Status: Full code Family Communication: None present at bedside.  Plan of care discussed with patient in length and he verbalized understanding and agreed with it. Disposition Plan: To be determined Consults called: None Admission status: Inpatient   Mckinley Jewel MD [facility] Pager [phone]  If 7PM-7AM, please contact night-coverage [email or web address] Password [facility]  06/24/2019, 7:09 PM

## 2019-07-03 NOTE — Consult Note (Addendum)
Surgical Consultation Requesting provider: Dr. 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  She has been see by Dr. Launa Flight at [facility] last week and offered sigmoid resection/ end colostomy vs diverting loop colostomy but wanted to think about it. She has previously been admitted with line sepsis and prior to that, UTI/ VTach arrest and recurrent C. Diff. Sent to ER by [place] facility where she is currently cared for due to abnormally low hemoglobin on spot check there (6.4) and patient seeming less communicative than usual.   She denies abdominal pain when asked, denies nausea or emesis, and reports that she is just feeling rough. Her abdomen was noted to be distended and tender here and general surgery is asked to see.   Allergies  Allergen Reactions  . Piperacillin-Tazobactam In Dex     Thrombocytopenia   . Metronidazole     neuropathy  . Gabapentin     Unknown  . Mercaptopurine Nausea Only  . Ciprofloxacin Rash    Past Medical History:  Diagnosis Date  . Bacteremia   . Cognitive communication disorder   . Coordination abnormal   . Crohn disease ([place])   . Muscle weakness (generalized)   . Unsteadiness on feet     History reviewed. No pertinent surgical history.  History reviewed. No pertinent family history.  Social History   Socioeconomic History  . Marital status: Divorced    Spouse name: Not on file  . Number of children: Not on file  . Years of education: Not on file  . Highest education level: Not on file  Occupational History  . Not on file  Tobacco  Use  . Smoking status: Former Research scientist (life sciences)  . Smokeless tobacco: Never Used  Substance and Sexual Activity  . Alcohol use: Not Currently  . Drug use: Never  . Sexual activity: Not Currently  Other Topics Concern  . Not on file  Social History Narrative  . Not on file   Social Determinants of Health   Financial Resource Strain:   . Difficulty of Paying Living Expenses:   Food Insecurity:   . Worried About Charity fundraiser in the Last Year:   . Arboriculturist in the Last Year:   Transportation Needs:   . Film/video editor (Medical):   Marland Kitchen Lack of Transportation (Non-Medical):   Physical Activity:   . Days of Exercise per Week:   . Minutes of Exercise per Session:   Stress:   . Feeling of Stress :   Social Connections:   . Frequency of Communication with Friends and Family:   . Frequency of Social Gatherings with Friends and Family:   . Attends Religious Services:   . Active Member of Clubs or Organizations:   . Attends Archivist Meetings:   Marland Kitchen Marital Status:     No current facility-administered medications on file prior to encounter.   Current Outpatient Medications on File Prior to Encounter  Medication Sig Dispense Refill  . acetaminophen (TYLENOL) 325 MG tablet Take 650 mg by mouth every 4 (four) hours as needed for mild pain.    Marland Kitchen albuterol (  PROVENTIL) (2.5 MG/3ML) 0.083% nebulizer solution Take 2.5 mg by nebulization every 4 (four) hours as needed for wheezing or shortness of breath.    Marland Kitchen buPROPion (WELLBUTRIN XL) 300 MG 24 hr tablet Take 300 mg by mouth daily.    . Carboxymethylcellulose Sodium (ARTIFICIAL TEARS OP) Place 1 drop into both eyes 3 (three) times daily as needed (for dry eyes).    . cholecalciferol (VITAMIN D) 25 MCG (1000 UNIT) tablet Take 1,000 Units by mouth daily.    Marland Kitchen escitalopram (LEXAPRO) 5 MG tablet Take 5 mg by mouth daily.    . hyoscyamine (LEVSIN SL) 0.125 MG SL tablet Place 0.125 mg under the tongue every 4 (four) hours as needed  for cramping.    . lidocaine (LIDODERM) 5 % Place 1 patch onto the skin daily. Remove & Discard patch within 12 hours or as directed by MD    . loperamide (IMODIUM) 2 MG capsule Take 4 mg by mouth 4 (four) times daily as needed for diarrhea or loose stools.    . Melatonin 3 MG TABS Take 3 mg by mouth at bedtime.    . Menthol-Methyl Salicylate (MUSCLE RUB) 10-15 % CREA Apply 1 application topically 3 (three) times daily as needed for muscle pain. 35 g 0  . methocarbamol (ROBAXIN) 500 MG tablet Take 1 tablet (500 mg total) by mouth every 6 (six) hours as needed for muscle spasms. 30 tablet 0  . ondansetron (ZOFRAN) 4 MG tablet Take 1 tablet (4 mg total) by mouth every 6 (six) hours as needed for nausea. 20 tablet 0  . oxyCODONE (OXY IR/ROXICODONE) 5 MG immediate release tablet Take 1 tablet (5 mg total) by mouth every 4 (four) hours as needed for moderate pain. (Patient taking differently: Take 10 mg by mouth every 4 (four) hours as needed for moderate pain. ) 20 tablet 0  . pantoprazole (PROTONIX) 40 MG tablet Take 40 mg by mouth daily.    . potassium chloride (KLOR-CON) 10 MEQ tablet Take 10 mEq by mouth daily.    . pravastatin (PRAVACHOL) 20 MG tablet Take 20 mg by mouth daily.    Marland Kitchen PRESCRIPTION MEDICATION 1 Bag by Pump Prime route See admin instructions. PER MAR: Clinimix TPN IV with multivitamin additive 10 mL/1400 mL/16 Hours infusion via CADD pump, add 10 MVI to each bag daily three times weekly, Tuesday, Thursday, Saturday. New bag to be started daily at 12 pm, stop at 4 am.    . saccharomyces boulardii (FLORASTOR) 250 MG capsule Take 250 mg by mouth 2 (two) times daily.    . Zinc Gluconate 100 MG TABS Take 1 tablet by mouth daily.    Marland Kitchen zolpidem (AMBIEN) 5 MG tablet Take 1 tablet (5 mg total) by mouth at bedtime as needed for sleep. 30 tablet 0  . Amino Acids-Protein Hydrolys (FEEDING SUPPLEMENT, PRO-STAT SUGAR FREE 64,) LIQD Take 30 mLs by mouth daily. 887 mL 0  . Infant Care Products  (DERMACLOUD EX) Apply 1 application topically 2 (two) times daily.    Marland Kitchen liver oil-zinc oxide (DESITIN) 40 % ointment Apply topically 2 (two) times daily as needed for irritation. (Patient not taking: Reported on 02/26/2019) 56.7 g 0  . Menthol-Zinc Oxide (CALMOSEPTINE EX) Apply 1 application topically 2 (two) times daily. Apply to groin/perineum    . Multiple Vitamin (MULTIVITAMIN WITH MINERALS) TABS tablet Take 1 tablet by mouth daily. (Patient not taking: Reported on 02/26/2019) 30 tablet 0    Review of Systems: a complete, 10pt review  of systems was completed with pertinent positives and negatives as documented in the HPI  Physical Exam: Vitals:   06/26/2019 2100 06/13/2019 2115  BP: (!) 93/53 (!) 79/50  Pulse: (!) 105 (!) 103  Resp: (!) 29 (!) 23  Temp:    SpO2: 99% 95%   Gen: Frail/ cachectic woman with diffuse muscle wasting. Alert, cooperative, answers most questions appropriately Eyes: lids and conjunctivae normal, no icterus. Pupils equally round and reactive to light.  Respiratory: supple without mass or thyromegaly Chest: respiratory effort is normal. No crepitus or tenderness on palpation of the chest. Breath sounds equal.  Cardiovascular: RRR with palpable distal pulses, no pedal edema Gastrointestinal: soft, distended, tender in periumbilical and left hemiabdomen. Prior RLQ ostomy site scar. No guarding, not peritoneal. No mass, hepatomegaly or splenomegaly.  Lymphatic: no lymphadenopathy in the neck or groin Muscoloskeletal: no clubbing or cyanosis of the fingers.  Strength is decreased throughout.  Neuro: cranial nerves grossly intact.  Sensation intact to light touch diffusely. Psych: appropriate mood and affect, cannot assess insight/judgment intact  Skin: warm and dry   CBC Latest Ref Rng & Units 06/11/2019 04/18/2019 04/17/2019  WBC 4.0 - 10.5 K/uL 8.5 6.2 6.4  Hemoglobin 12.0 - 15.0 g/dL 7.0(L) 8.2(L) 7.8(L)  Hematocrit 36.0 - 46.0 % 24.3(L) 26.6(L) 25.3(L)  Platelets  150 - 400 K/uL 315 273 226    CMP Latest Ref Rng & Units 07/10/2019 04/18/2019 04/17/2019  Glucose 70 - 99 mg/dL 124(H) 83 91  BUN 8 - 23 mg/dL 33(H) 24(H) 23  Creatinine 0.44 - 1.00 mg/dL 1.43(H) 1.31(H) 1.38(H)  Sodium 135 - 145 mmol/L 137 133(L) 131(L)  Potassium 3.5 - 5.1 mmol/L 2.6(LL) 3.8 4.0  Chloride 98 - 111 mmol/L 104 107 106  CO2 22 - 32 mmol/L 21(L) 16(L) 16(L)  Calcium 8.9 - 10.3 mg/dL 8.1(L) 8.7(L) 8.7(L)  Total Protein 6.5 - 8.1 g/dL 7.0 7.5 7.3  Total Bilirubin 0.3 - 1.2 mg/dL 0.9 0.3 0.6  Alkaline Phos 38 - 126 U/L 178(H) 132(H) 133(H)  AST 15 - 41 U/L 42(H) 32 36  ALT 0 - 44 U/L 27 24 29     Lab Results  Component Value Date   INR 1.4 (H) 07/01/2019    Imaging: CT ABDOMEN PELVIS WO CONTRAST  Result Date: 07/08/2019 CLINICAL DATA:  69 year old female with Crohn disease. Increased lethargy and abdominal distension. Unexplained anemia. EXAM: CT ABDOMEN AND PELVIS WITHOUT CONTRAST TECHNIQUE: Multidetector CT imaging of the abdomen and pelvis was performed following the standard protocol without IV contrast. COMPARISON:  CT Abdomen and Pelvis 02/26/2019. FINDINGS: Lower chest: Respiratory motion. Mild lung base scarring or atelectasis but no definite lung base pulmonary inflammation. Trace layering pleural effusions. Mild cardiomegaly. No pericardial effusion. Hepatobiliary: Chronically absent gallbladder. Negative noncontrast liver. Pancreas: Chronically atrophied. Spleen: Stable mild splenomegaly. Adrenals/Urinary Tract: Negative adrenal glands. Chronic left hydronephrosis and hydroureter with urothelial thickening. Stable inflammatory stranding at the left renal pelvis. A large exophytic left renal cyst with simple fluid density is stable. Mild left nephrolithiasis. No obstructive uropathy on the right although chronic right nephrolithiasis and areas of right renal cortical scarring. The bladder was abnormal in November but has a more normal appearance today, and does not appear  involved in the pelvic abnormality emanating from the sigmoid colon described below. Stomach/Bowel: No free intraperitoneal fluid. There is trace free fluid in the left abdomen. Negative stomach and proximal small bowel. There is oral contrast mixed with retained stool throughout the colon. In the anterior mid  lower abdomen there is a surgical anastomosis at what appears to in the be distal small bowel. The native terminal ileum seems to remain in place along with a normal appearing appendix seen on coronal image 39. The TI is within normal limits. Decompressed transverse colon. Redundant descending colon containing stool mixed with contrast, and the large bowel becomes abnormal in the distal descending segment. From series 3, image 49 throughout the distal sigmoid, the large bowel is diffusely thick-walled, indistinct, and appears inflamed. There is associated inflammation along the left pelvic side wall, in the region of the left adnexa. There are multiple superimposed small areas of suspicious extraluminal gas (such as on sagittal image 61) and there is suspicion of a fistulous connection from the abnormal sigmoid colon to the distal small bowel such as on series 3, image 64. The abnormal sigmoid is also inseparable from the dorsal uterine fundus. See sagittal image 64. Vascular/Lymphatic: Vascular patency is not evaluated in the absence of IV contrast. Extensive Aortoiliac calcified atherosclerosis. No lymphadenopathy identified. Reproductive: The uterus is identified on sagittal image 59, but is difficult to delineate from the abnormal sigmoid colon. Normal right adnexa. Left adnexa cannot be delineated from the abnormal sigmoid colon. Other: No pelvic free fluid. Musculoskeletal: Chronic mid lumbar vertebral body augmentation. Chronic appearing lower thoracic vertebral endplate compression. Osteopenia. No acute osseous abnormality identified. IMPRESSION: 1. Progression of abnormal sigmoid colon since November,  favored due to inflammatory bowel disease affecting the distal descending and sigmoid. There is increased long segment circumferential colonic wall thickening, abundant regional mesenteric inflammation, evidence of a CONTAINED PERFORATION into the sigmoid mesentery, and possible fistula formation to distal small bowel. The inflammation is also inseparable from the uterine fundus and left adnexa. No drainable fluid collection is evident. 2. Chronic inflammation of the left kidney and ureter with a degree of obstructive uropathy. And this may be indicative of distal left ureteral involvement in #1. 3. Mild cardiomegaly. Trace pleural effusions. Aortic Atherosclerosis (ICD10-I70.0). Chronic nephrolithiasis. Mild chronic splenomegaly. Electronically Signed   By: Genevie Ann M.D.   On: 07/10/2019 20:48   DG Chest Port 1 View  Result Date: 06/17/2019 CLINICAL DATA:  Fever. EXAM: PORTABLE CHEST 1 VIEW COMPARISON:  April 12, 2019 FINDINGS: The right-sided PICC line seen on the prior study has been removed. A left-sided PICC line is seen with its distal tip noted near the junction of the superior vena cava and right atrium. Mild, chronic appearing increased lung markings are seen without evidence of acute infiltrate, pleural effusion or pneumothorax. The heart size and mediastinal contours are within normal limits. The visualized skeletal structures are unremarkable. IMPRESSION: No active disease. Electronically Signed   By: Virgina Norfolk M.D.   On: 06/16/2019 17:27     A/P: 01BP chronically ill/ frail woman with sepsis. History of Crohn's disease (not on medications) with known chronic obstruction, fistulae and abscess. She has previously been admitted with line sepsis and prior to that, UTI/ VTach arrest and recurrent C. Diff. Sent to ER by [place] facility where she is currently cared for due to abnormally low hemoglobin on spot check there (6.4) and patient seeming less communicative than usual.    -Abdominal distention and tenderness present- this is unchanged from her exam at [facility] last week and is likely chronic. CT demonstrates progression of inflammatory bowel disease with contained perforation into the mesentery, possible small bowel fistula/ involvement of uterine/left adnexa and renal collecting system. Agree with broad spectrum IV antibiotics and ongoing resuscitation.  I do not appreciate any drainable abscess. No emergent surgical intervention is warranted at this time. Consider GI evaluation to help manage her Crohns. Surgery will continue to follow.    -PICC in place, history of line sepsis- Blood cultures pending. -UTI- empiric abx, cultures pending -Severe malnutrition- albumin 1.2  -Hypokalemia 2.6 -CKD cr 1.43 -Anemia hgb 7.0- chronic disease; recent baseline appears to be about 8  Patient Active Problem List   Diagnosis Date Noted  . Sepsis ([place]) 06/27/2019  . Hypokalemia 06/17/2019  . Cognitive communication disorder   . Anemia   . Pressure injury of skin 04/16/2019  . MRSA bacteremia 01/25/2019  . Bacteremia 01/24/2019  . Crohn's disease ([place]) 01/24/2019       Romana Juniper, MD [facility], [place]  See AMION to contact appropriate on-call provider

## 2019-07-03 NOTE — ED Notes (Signed)
Dr. Kae Heller paged to RN per her request

## 2019-07-03 NOTE — ED Triage Notes (Signed)
Pt arrives to ED triage with [facility] from [facility] with complaints of low hemoglobin reading of 6.4. Per NH staff patient complained of being cold the last two days so they took a CBC which came back abnormal. Patient is lethargic and has hx of cognitive communication deficit.

## 2019-07-03 NOTE — Progress Notes (Signed)
Pharmacy Antibiotic Note  Katrina Boyer is a 69 y.o. female admitted on 07/08/2019 with sepsis. Pharmacy has been consulted for vancomycin and cefepime dosing.  WBC 8.5, lactate, 3.6, Tmax 99.9, Scr 1.43 (BL 0.8-1.3), estimated CrCl ~12m.min  Plan: Vancomycin 10063mIV x1 then 50024mV Q24h Goal trough 15-20 mcg/mL Goal AUC 400-550 Expected AUC: 480 SCr used: 1.43 Cefepime 2g IV Q24h F/u clinical progress, c/s, de-escalation, and LOT     Temp (24hrs), Avg:99.9 F (37.7 C), Min:99.9 F (37.7 C), Max:99.9 F (37.7 C)  Recent Labs  Lab 07/07/2019 1539  WBC 8.5  CREATININE 1.43*  LATICACIDVEN 3.6*    CrCl cannot be calculated (Unknown ideal weight.).    Allergies  Allergen Reactions  . Piperacillin-Tazobactam In Dex     Thrombocytopenia   . Metronidazole     neuropathy  . Gabapentin   . Mercaptopurine Nausea Only  . Ciprofloxacin Rash    Antimicrobials this admission: 3/23 vanc >>  3/23 cefepime >>   Dose adjustments this admission: N/A  Microbiology results: 3/23 BCx: sent 3/23 UCx: sent   Thank you for allowing pharmacy to be a part of this patient's care.  ChrKennon HolterharmD PGY1 Ambulatory Care Pharmacy Resident 06/15/2019 5:40 PM

## 2019-07-03 NOTE — ED Notes (Signed)
Pt placed on 02 via [place] at 2LPM

## 2019-07-03 NOTE — ED Notes (Signed)
Katrina Boyer sister [phone] looking for an update on the pt

## 2019-07-03 NOTE — ED Provider Notes (Signed)
[facility] Provider Note   CSN: [id number] Arrival date & time: 07/07/2019  1510     History Chief Complaint  Patient presents with  . Abnormal Lab    Katrina Boyer is a 69 y.o. female.  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  She has been more lethargic.  She had her blood checked at the nursing home and her hemoglobin was below 7.  Patient sent to the ED for lethargy  The history is provided by the nursing home. No language interpreter was used.  Altered Mental Status Presenting symptoms: disorientation   Severity:  Moderate Most recent episode:  Today Episode history:  Multiple Timing:  Constant Progression:  Worsening Chronicity:  New Context: not alcohol use        Past Medical History:  Diagnosis Date  . Bacteremia   . Cognitive communication disorder   . Coordination abnormal   . Crohn disease ([place])   . Muscle weakness (generalized)   . Unsteadiness on feet     Patient Active Problem List   Diagnosis Date Noted  . Sepsis ([place]) 06/27/2019  . Hypokalemia 06/19/2019  . Cognitive communication disorder   . Anemia   . Pressure injury of skin 04/16/2019  . MRSA bacteremia 01/25/2019  . Bacteremia 01/24/2019  . Crohn's disease ([place]) 01/24/2019    History reviewed. No pertinent surgical history.   OB History   No obstetric history on file.     History reviewed. No pertinent family history.  Social History   Tobacco Use  . Smoking status: Former Research scientist (life sciences)  . Smokeless tobacco: Never Used  Substance Use Topics  . Alcohol use: Not Currently  . Drug use: Never    Home Medications Prior to Admission medications   Medication Sig Start Date End Date Taking? Authorizing Provider  acetaminophen (TYLENOL) 325 MG tablet Take 650 mg by mouth every 4 (four) hours as needed for mild pain.    [provider]  albuterol (PROVENTIL) (2.5 MG/3ML) 0.083% nebulizer solution Take 2.5 mg by  nebulization every 4 (four) hours as needed for wheezing or shortness of breath.    [provider]  Amino Acids-Protein Hydrolys (FEEDING SUPPLEMENT, PRO-STAT SUGAR FREE 64,) LIQD Take 30 mLs by mouth daily. 02/01/19   Swayze, Ava, DO  buPROPion (WELLBUTRIN XL) 300 MG 24 hr tablet Take 300 mg by mouth daily.    [provider]  escitalopram (LEXAPRO) 5 MG tablet Take 5 mg by mouth daily.    [provider]  hyoscyamine (LEVSIN SL) 0.125 MG SL tablet Place 0.125 mg under the tongue every 4 (four) hours as needed for cramping.    [provider]  Infant Care Products [facility] EX) Apply 1 application topically 2 (two) times daily.    [provider]  lidocaine (LIDODERM) 5 % Place 1 patch onto the skin daily. Remove & Discard patch within 12 hours or as directed by MD    [provider]  liver oil-zinc oxide (DESITIN) 40 % ointment Apply topically 2 (two) times daily as needed for irritation. Patient not taking: Reported on 02/26/2019 02/01/19   Swayze, Ava, DO  loperamide (IMODIUM) 2 MG capsule Take 4 mg by mouth 4 (four) times daily as needed for diarrhea or loose stools.    [provider]  Melatonin 3 MG TABS Take 3 mg by mouth at bedtime.    [provider]  Menthol-Methyl Salicylate (MUSCLE RUB) 10-15 % CREA Apply 1  application topically 3 (three) times daily as needed for muscle pain. 02/01/19   Swayze, Ava, DO  Menthol-Zinc Oxide (CALMOSEPTINE EX) Apply 1 application topically 2 (two) times daily. Apply to groin/perineum    [provider]  methocarbamol (ROBAXIN) 500 MG tablet Take 1 tablet (500 mg total) by mouth every 6 (six) hours as needed for muscle spasms. 02/01/19   Swayze, Ava, DO  Multiple Vitamin (MULTIVITAMIN WITH MINERALS) TABS tablet Take 1 tablet by mouth daily. Patient not taking: Reported on 02/26/2019 02/01/19   Swayze, Ava, DO  Multiple Vitamins-Minerals (CEROVITE PO) Take 1 tablet by mouth  daily.    [provider]  ondansetron (ZOFRAN) 4 MG tablet Take 1 tablet (4 mg total) by mouth every 6 (six) hours as needed for nausea. 02/01/19   Swayze, Ava, DO  oxyCODONE (OXY IR/ROXICODONE) 5 MG immediate release tablet Take 1 tablet (5 mg total) by mouth every 4 (four) hours as needed for moderate pain. Patient taking differently: Take 10 mg by mouth every 4 (four) hours as needed for moderate pain.  02/01/19   Swayze, Ava, DO  pantoprazole (PROTONIX) 40 MG tablet Take 40 mg by mouth daily.    [provider]  pravastatin (PRAVACHOL) 20 MG tablet Take 20 mg by mouth daily.    [provider]  PRESCRIPTION MEDICATION PER MAR: Clinimix TPN IV with multivitamin additive 10 mL/1100 mL/16 Hours infusion via CADD pump, add 10 MVI to each bag daily. New bag to be started daily at 12 pm, stop at 4 am    [provider]  saccharomyces boulardii (FLORASTOR) 250 MG capsule Take 250 mg by mouth 2 (two) times daily.    [provider]  zolpidem (AMBIEN) 5 MG tablet Take 1 tablet (5 mg total) by mouth at bedtime as needed for sleep. 02/01/19   Swayze, Ava, DO    Allergies    Piperacillin-tazobactam in dex, Metronidazole, Gabapentin, Mercaptopurine, and Ciprofloxacin  Review of Systems   Review of Systems  Unable to perform ROS: Mental status change    Physical Exam Updated Vital Signs BP (!) 101/46   Pulse (!) 109   Temp 99.9 F (37.7 C) (Oral)   Resp (!) 33   SpO2 97%   Physical Exam Vitals and nursing note reviewed.  Constitutional:      Appearance: She is well-developed.     Comments: Lethargic  HENT:     Head: Normocephalic.     Nose: Nose normal.  Eyes:     General: No scleral icterus.    Conjunctiva/sclera: Conjunctivae normal.  Neck:     Thyroid: No thyromegaly.  Cardiovascular:     Rate and Rhythm: Normal rate and regular rhythm.     Heart sounds: No murmur. No friction rub. No gallop.   Pulmonary:     Breath sounds: No  stridor. Rales present. No wheezing.  Chest:     Chest wall: No tenderness.  Abdominal:     General: There is no distension.     Tenderness: There is no abdominal tenderness. There is no rebound.  Musculoskeletal:        General: Normal range of motion.     Cervical back: Neck supple.  Lymphadenopathy:     Cervical: No cervical adenopathy.  Skin:    General: Skin is warm.     Findings: No erythema or rash.  Neurological:     Motor: No abnormal muscle tone.     Coordination: Coordination normal.  Comments: Patient lethargic but arousable to verbal and painful stimuli.  She is oriented to person only     ED Results / Procedures / Treatments   Labs (all labs ordered are listed, but only abnormal results are displayed) Labs Reviewed  COMPREHENSIVE METABOLIC PANEL - Abnormal; Notable for the following components:      Result Value   Potassium 2.6 (*)    CO2 21 (*)    Glucose, Bld 124 (*)    BUN 33 (*)    Creatinine, Ser 1.43 (*)    Calcium 8.1 (*)    Albumin 1.2 (*)    AST 42 (*)    Alkaline Phosphatase 178 (*)    GFR calc non Af Amer 38 (*)    GFR calc Af Amer 44 (*)    All other components within normal limits  CBC - Abnormal; Notable for the following components:   RBC 2.68 (*)    Hemoglobin 7.0 (*)    HCT 24.3 (*)    MCHC 28.8 (*)    RDW 16.1 (*)    All other components within normal limits  LACTIC ACID, PLASMA - Abnormal; Notable for the following components:   Lactic Acid, Venous 3.6 (*)    All other components within normal limits  LACTIC ACID, PLASMA - Abnormal; Notable for the following components:   Lactic Acid, Venous 3.7 (*)    All other components within normal limits  PROTIME-INR - Abnormal; Notable for the following components:   Prothrombin Time 16.9 (*)    INR 1.4 (*)    All other components within normal limits  BRAIN NATRIURETIC PEPTIDE - Abnormal; Notable for the following components:   B Natriuretic Peptide 308.9 (*)    All other components  within normal limits  CULTURE, BLOOD (ROUTINE X 2)  CULTURE, BLOOD (ROUTINE X 2)  URINE CULTURE  RESPIRATORY PANEL BY RT PCR (FLU A&B, COVID)  APTT  URINALYSIS, ROUTINE W REFLEX MICROSCOPIC  OCCULT BLOOD X 1 CARD TO LAB, STOOL  MAGNESIUM  COMPREHENSIVE METABOLIC PANEL  CBC  POC OCCULT BLOOD, ED  TYPE AND SCREEN  ABO/RH  PREPARE RBC (CROSSMATCH)    EKG None  Radiology DG Chest Port 1 View  Result Date: 06/26/2019 CLINICAL DATA:  Fever. EXAM: PORTABLE CHEST 1 VIEW COMPARISON:  April 12, 2019 FINDINGS: The right-sided PICC line seen on the prior study has been removed. A left-sided PICC line is seen with its distal tip noted near the junction of the superior vena cava and right atrium. Mild, chronic appearing increased lung markings are seen without evidence of acute infiltrate, pleural effusion or pneumothorax. The heart size and mediastinal contours are within normal limits. The visualized skeletal structures are unremarkable. IMPRESSION: No active disease. Electronically Signed   By: Virgina Norfolk M.D.   On: 07/02/2019 17:27    Procedures Procedures (including critical care time)  Medications Ordered in ED Medications  vancomycin (VANCOREADY) IVPB 500 mg/100 mL (has no administration in time range)  ceFEPIme (MAXIPIME) 2 g in sodium chloride 0.9 % 100 mL IVPB (has no administration in time range)  0.9 %  sodium chloride infusion (has no administration in time range)  acetaminophen (TYLENOL) tablet 650 mg (has no administration in time range)    Or  acetaminophen (TYLENOL) suppository 650 mg (has no administration in time range)  0.9 % NaCl with KCl 40 mEq / L  infusion (has no administration in time range)  ondansetron (ZOFRAN) tablet 4 mg (has no administration in  time range)    Or  ondansetron (ZOFRAN) injection 4 mg (has no administration in time range)  sodium chloride 0.9 % bolus 2,000 mL (2,000 mLs Intravenous New Bag/Given 07/02/2019 1740)  vancomycin (VANCOCIN)  IVPB 1000 mg/200 mL premix (0 mg Intravenous Stopped 06/11/2019 1845)  potassium chloride 10 mEq in 100 mL IVPB (0 mEq Intravenous Stopped 06/28/2019 1845)  ceFEPIme (MAXIPIME) 2 g in sodium chloride 0.9 % 100 mL IVPB (0 g Intravenous Stopped 06/28/2019 1845)  acetaminophen (TYLENOL) suppository 650 mg (650 mg Rectal Given 06/19/2019 1809)    ED Course  I have reviewed the triage vital signs and the nursing notes.  Pertinent labs & imaging results that were available during my care of the patient were reviewed by me and considered in my medical decision making (see chart for details).    MDM Rules/Calculators/A&P                      CRITICAL CARE Performed by: Milton Ferguson Total critical care time: 40 minutes Critical care time was exclusive of separately billable procedures and treating other patients. Critical care was necessary to treat or prevent imminent or life-threatening deterioration. Critical care was time spent personally by me on the following activities: development of treatment plan with patient and/or surrogate as well as nursing, discussions with consultants, evaluation of patient's response to treatment, examination of patient, obtaining history from patient or surrogate, ordering and performing treatments and interventions, ordering and review of laboratory studies, ordering and review of radiographic studies, pulse oximetry and re-evaluation of patient's condition.  Final Clinical Impression(s) / ED Diagnoses Final diagnoses:  None   Patient with anemia and sepsis.  She will be treated according to sepsis protocol and given a unit of blood and admitted to medicine Rx / DC Orders ED Discharge Orders    None       Milton Ferguson, MD 06/27/2019 [phone]

## 2019-07-03 NOTE — ED Notes (Signed)
Pt spoke with her son on the phone.  Son updated on her status by this RN at pt's request.

## 2019-07-04 DIAGNOSIS — E876 Hypokalemia: Secondary | ICD-10-CM

## 2019-07-04 DIAGNOSIS — K50119 Crohn's disease of large intestine with unspecified complications: Secondary | ICD-10-CM

## 2019-07-04 LAB — COMPREHENSIVE METABOLIC PANEL
ALT: 57 U/L — ABNORMAL HIGH (ref 0–44)
AST: 141 U/L — ABNORMAL HIGH (ref 15–41)
Albumin: 1.1 g/dL — ABNORMAL LOW (ref 3.5–5.0)
Alkaline Phosphatase: 227 U/L — ABNORMAL HIGH (ref 38–126)
Anion gap: 13 (ref 5–15)
BUN: 32 mg/dL — ABNORMAL HIGH (ref 8–23)
CO2: 15 mmol/L — ABNORMAL LOW (ref 22–32)
Calcium: 7.5 mg/dL — ABNORMAL LOW (ref 8.9–10.3)
Chloride: 115 mmol/L — ABNORMAL HIGH (ref 98–111)
Creatinine, Ser: 1.77 mg/dL — ABNORMAL HIGH (ref 0.44–1.00)
GFR calc Af Amer: 34 mL/min — ABNORMAL LOW (ref >60)
GFR calc non Af Amer: 29 mL/min — ABNORMAL LOW (ref >60)
Glucose, Bld: 78 mg/dL (ref 70–99)
Potassium: 3.5 mmol/L (ref 3.5–5.1)
Sodium: 143 mmol/L (ref 135–145)
Total Bilirubin: 1.5 mg/dL — ABNORMAL HIGH (ref 0.3–1.2)
Total Protein: 5.8 g/dL — ABNORMAL LOW (ref 6.5–8.1)

## 2019-07-04 LAB — PHOSPHORUS: Phosphorus: 3.3 mg/dL (ref 2.5–4.6)

## 2019-07-04 LAB — C DIFFICILE QUICK SCREEN W PCR REFLEX
C Diff antigen: NEGATIVE
C Diff interpretation: NOT DETECTED
C Diff toxin: NEGATIVE

## 2019-07-04 LAB — CBC
HCT: 24.8 % — ABNORMAL LOW (ref 36.0–46.0)
Hemoglobin: 7 g/dL — ABNORMAL LOW (ref 12.0–15.0)
MCH: 27.5 pg (ref 26.0–34.0)
MCHC: 28.2 g/dL — ABNORMAL LOW (ref 30.0–36.0)
MCV: 97.3 fL (ref 80.0–100.0)
Platelets: 240 10*3/uL (ref 150–400)
RBC: 2.55 MIL/uL — ABNORMAL LOW (ref 3.87–5.11)
RDW: 16 % — ABNORMAL HIGH (ref 11.5–15.5)
WBC: 17.5 10*3/uL — ABNORMAL HIGH (ref 4.0–10.5)
nRBC: 0 % (ref 0.0–0.2)

## 2019-07-04 LAB — OCCULT BLOOD X 1 CARD TO LAB, STOOL: Fecal Occult Bld: NEGATIVE

## 2019-07-04 LAB — LACTIC ACID, PLASMA: Lactic Acid, Venous: 1.9 mmol/L (ref 0.5–1.9)

## 2019-07-04 LAB — PREALBUMIN: Prealbumin: <5 mg/dL — ABNORMAL LOW (ref 18–38)

## 2019-07-04 LAB — MAGNESIUM: Magnesium: 1.9 mg/dL (ref 1.7–2.4)

## 2019-07-04 MED ORDER — DEXTROSE IN LACTATED RINGERS 5 % IV SOLN
INTRAVENOUS | Status: AC
  Administered 2019-07-07: 1000 mL via INTRAVENOUS

## 2019-07-04 MED ORDER — ALTEPLASE 2 MG IJ SOLR
2.0000 mg | Freq: Once | INTRAMUSCULAR | Status: AC
  Administered 2019-07-04: 2 mg
  Filled 2019-07-04: qty 2

## 2019-07-04 MED ORDER — SODIUM CHLORIDE 0.9% FLUSH: 10.0000 mL | INTRAVENOUS | Status: DC | PRN

## 2019-07-04 MED ORDER — IPRATROPIUM-ALBUTEROL 0.5-2.5 (3) MG/3ML IN SOLN: 3.0000 mL | RESPIRATORY_TRACT | Status: DC | PRN

## 2019-07-04 MED ORDER — SODIUM CHLORIDE 0.9% FLUSH
10.0000 mL | Freq: Two times a day (BID) | INTRAVENOUS | Status: DC
  Administered 2019-07-04: 10 mL
  Administered 2019-07-04: 20 mL
  Administered 2019-07-05: 40 mL
  Administered 2019-07-05: 10 mL
  Administered 2019-07-06: 40 mL
  Administered 2019-07-06 – 2019-07-09 (×3): 10 mL

## 2019-07-04 MED ORDER — HYDROMORPHONE HCL 1 MG/ML IJ SOLN
1.0000 mg | INTRAMUSCULAR | Status: DC | PRN
  Administered 2019-07-04 – 2019-07-07 (×7): 1 mg via INTRAVENOUS
  Filled 2019-07-04 (×8): qty 1

## 2019-07-04 MED ORDER — SODIUM CHLORIDE 0.9% FLUSH
10.0000 mL | INTRAVENOUS | Status: DC | PRN
  Administered 2019-07-09: 10 mL

## 2019-07-04 MED ORDER — SODIUM CHLORIDE 0.9% FLUSH
10.0000 mL | Freq: Two times a day (BID) | INTRAVENOUS | Status: DC
  Administered 2019-07-04: 10 mL
  Administered 2019-07-04: 20 mL
  Administered 2019-07-05: 10 mL
  Administered 2019-07-05 – 2019-07-06 (×2): 40 mL
  Administered 2019-07-06 – 2019-07-07 (×2): 10 mL

## 2019-07-04 MED ORDER — CHLORHEXIDINE GLUCONATE CLOTH 2 % EX PADS
6.0000 | MEDICATED_PAD | Freq: Every day | CUTANEOUS | Status: DC
  Administered 2019-07-04 – 2019-07-08 (×5): 6 via TOPICAL

## 2019-07-04 MED ORDER — ALBUMIN HUMAN 5 % IV SOLN
12.5000 g | Freq: Once | INTRAVENOUS | Status: AC
  Administered 2019-07-04: 12.5 g via INTRAVENOUS
  Filled 2019-07-04: qty 250

## 2019-07-04 NOTE — Progress Notes (Signed)
PHARMACY - TOTAL PARENTERAL NUTRITION CONSULT NOTE   Indication: Chronic; intolerance to enteral feeding  Patient Measurements: Height: 5' 4"  (162.6 cm) Weight: 116 lb 13.5 oz (53 kg) IBW/kg (Calculated) : 54.7 TPN AdjBW (KG): 53 Body mass index is 20.06 kg/m.  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 She was admitted from [facility] facility   Glucose / Insulin: CBG 124>>78.  Electrolytes: K 3.5, Mg 1.9, CO2 low 15, Cl elevated at 115, CoCa 9.8. No Phos available.  Renal: SCr 1.43 >>1.77, BUN 32.  LFTs / TGs: AST/ALT up 141/57, Tbili up 1.5 Prealbumin / albumin: Prealbumin <5 / Albumin 1.1 Intake / Output; MIVF:  GI Imaging: Surgeries / Procedures:   Central access: PICC (placed 04/18/19) TPN start date: Restart 3/25     Nutritional Goals (per RD recommendation on pending):  Current Nutrition:  NPO TPN - Chronic  Chronic TPN Formula: awaiting fax from [facility]:  TPN consult ordered after hospital TPN compounding time and no central access.  Will order labs and follow-up for access placement and TPN start on 07/05/19.   Current CBG low at 78 - consider starting dextrose until TPN can be resumed tomorrow at 1800 PM. Made Dr. Cathlean Sauer aware.   Sloan Leiter, PharmD, BCPS, BCCCP Clinical Pharmacist Please refer to [facility] for [place] numbers 07/04/2019,2:08 PM

## 2019-07-04 NOTE — Progress Notes (Signed)
VAST consulted to assess LA PICC and flush. Assessed PICC which flushed easily, but did not give blood return. Ordered Alteplase for de-clotting. Unit RN to place IVT consult once medication arrives.  Pt with 2 PIV's in left extremity. Requested that Jenny Reichmann, pt's RN remove those 2 IV's since they are in the same extremity as PICC. He verbalized understanding.

## 2019-07-04 NOTE — Progress Notes (Signed)
Prior to admission  we was told via the ED charge that the NP stated that she was okay with a MAP of 50 , since arrival to the floor pt has had MAPs  ( see flowsheets )that have been on the lower end so I  sent a message to On call to  clarify she confirmed that this was acceptable will continue to closely monitor pt's VS

## 2019-07-04 NOTE — Progress Notes (Signed)
PROGRESS NOTE    Katrina Boyer  BSJ:[id number] DOB: 1950-12-26 DOA: 06/20/2019 PCP: Seward Carol, MD    Brief Narrative:  Patient was admitted to the hospital working diagnosis acute colitis, complicated by sepsis, endorgan failure acute kidney injury.   69 year old female who presented with anemia.  She does have significant past medical history of Crohn's disease (chronic TPN), chronic kidney disease stage III, dyslipidemia and cognitive impairment.  She was transferred from the nursing home due to low hemoglobin.  Patient unable to give any detailed history due to cognitive impairment.  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  Patient was noted to have generalized weakness, lethargy and fever.  Positive abdominal pain and distention.  Upon her initial physical examination she was febrile 103 F, heart rate 122, respirate 31, oxygen saturation 91%, moist mucous membranes, lungs clear to auscultation bilaterally, heart S1-S2 present and rhythmic, abdomen distended, generalized tenderness, no lower extremity edema. Sodium 137, potassium 2.6, chloride 104, bicarb 21, glucose 124, BUN 33, creatinine 1.43, white count 8.5, hemoglobin 7.0, hematocrit 24.3, platelets 315.  SARS COVID-19 was negative.  Urinalysis white count more than 50, 0-5 red cells.   CT of the abdomen with increased long segmental circumferential colonic wall thickening, abundant regional mesenteric inflammation, evidence of contained perforation into the sigmoid mesentery, possible fistula formation of the distal small bowel, complicated with left kidney obstructive uropathy.  Chest radiograph no infiltrates.  EKG 106 bpm, left axis deviation, normal intervals, sinus rhythm, no ST segment T wave changes.  Assessment & Plan:   Active Problems:   Crohn's disease ([place])   Sepsis ([place])   Hypokalemia   Cognitive  communication disorder   Anemia   1. Acute Crohn's colitis/ sepsis present on admission/ end organ failure AKI. Patient continue to have significant abdominal pain, no indication for surgery per surgery team. Wbc is up to 17.   Will continue supportive medical therapy with IV fluids, IV antibiotics, IV analgesics and as needed antiemetics. Continue IV antiacids. Will dc vancomycin and will continue cefepime, patient is allergic to metronidazole and zosyn. Will increase hydromorphone to 1 mg as needed from 0,5 mg.   Will resume TPN and will allow patient to have ice chips. Follow with GI recommendations.   Apparently patient not able to get immuno modulators at the SNF.    2. AKI with hypokalemia/ hypomagnesemia/ ckd stage 3a. Worsening renal function to serum cr at 1,77 from 1,43, with K at 3,5. Will continue with IV fluids balanced electrolyte solutions with dextrose and will follow up renal function in am.  3. Acute on chronic anemia/ likely acute on chronic blood loss due to IBD. Sp one unit PRBC transfusion. Hgb today at 7,0 with Hct at 24, will follow up cell count in am, transfuse for Hgb less than 7.0.  4. Dyslipidemia. Continue with statin therapy.    5. Severe calorie protein malnutrition. Continue with TPN for nutrition.   DVT prophylaxis: enoxaparin   Code Status:  full Family Communication: I spoke with patient's son at the bedside, we talked in detail about patient's condition, plan of care and prognosis and all questions were addressed.  Disposition Plan/ discharge barriers: patient from SNF, continue to need IV therapies and high nursing care, expected to dc to SNF.     Consultants:   GI   Procedures:     Antimicrobials:  Subjective: Patient continue to have moderate to severe abdominal pain, no nausea or vomiting, no chest pain, or dyspnea.   Objective: Vitals:   07/04/19 0340 07/04/19 0433 07/04/19 0500 07/04/19 0808  BP: (!) 75/44 (!) 87/46 (!)  97/52 (!) 89/55  Pulse: 100 99  96  Resp: (!) 26 (!) 30  20  Temp: 98.1 F (36.7 C)   98.2 F (36.8 C)  TempSrc: Oral   Oral  SpO2: 96% 100%  99%  Weight:      Height:        Intake/Output Summary (Last 24 hours) at 07/04/2019 1135 Last data filed at 07/04/2019 0518 Gross per 24 hour  Intake 2410.97 ml  Output --  Net 2410.97 ml   Filed Weights   06/20/2019 2051 07/04/19 0212  Weight: 46.7 kg 53 kg    Examination:   General: patient deconditioned and ill looking appearing  Neurology: Awake and alert, non focal  E ENT: no pallor, no icterus, oral mucosa moist Cardiovascular: No JVD. S1-S2 present, rhythmic, no gallops, rubs, or murmurs. No lower extremity edema. Pulmonary: positive breath sounds bilaterally, adequate air movement, no wheezing, rhonchi or rales. Gastrointestinal. Abdomen mild distend, tender to palpation with no organomegaly, no rebound or guarding Skin. No rashes Musculoskeletal: no joint deformities     Data Reviewed: I have personally reviewed following labs and imaging studies  CBC: Recent Labs  Lab 07/07/2019 1539 07/04/19 0250  WBC 8.5 17.5*  HGB 7.0* 7.0*  HCT 24.3* 24.8*  MCV 90.7 97.3  PLT 315 111   Basic Metabolic Panel: Recent Labs  Lab 07/05/2019 1539 07/04/19 0250  NA 137 143  K 2.6* 3.5  CL 104 115*  CO2 21* 15*  GLUCOSE 124* 78  BUN 33* 32*  CREATININE 1.43* 1.77*  CALCIUM 8.1* 7.5*  MG  --  1.9   GFR: Estimated Creatinine Clearance: 26.2 mL/min (A) (by C-G formula based on SCr of 1.77 mg/dL (H)). Liver Function Tests: Recent Labs  Lab 07/06/2019 1539 07/04/19 0250  AST 42* 141*  ALT 27 57*  ALKPHOS 178* 227*  BILITOT 0.9 1.5*  PROT 7.0 5.8*  ALBUMIN 1.2* 1.1*   No results for input(s): LIPASE, AMYLASE in the last 168 hours. No results for input(s): AMMONIA in the last 168 hours. Coagulation Profile: Recent Labs  Lab 06/23/2019 1712  INR 1.4*   Cardiac Enzymes: No results for input(s): CKTOTAL, CKMB, CKMBINDEX,  TROPONINI in the last 168 hours. BNP (last 3 results) No results for input(s): PROBNP in the last 8760 hours. HbA1C: No results for input(s): HGBA1C in the last 72 hours. CBG: No results for input(s): GLUCAP in the last 168 hours. Lipid Profile: No results for input(s): CHOL, HDL, LDLCALC, TRIG, CHOLHDL, LDLDIRECT in the last 72 hours. Thyroid Function Tests: No results for input(s): TSH, T4TOTAL, FREET4, T3FREE, THYROIDAB in the last 72 hours. Anemia Panel: No results for input(s): VITAMINB12, FOLATE, FERRITIN, TIBC, IRON, RETICCTPCT in the last 72 hours.    Radiology Studies: I have reviewed all of the imaging during this hospital visit personally     Scheduled Meds: . Chlorhexidine Gluconate Cloth  6 each Topical Daily  . sodium chloride flush  10-40 mL Intracatheter Q12H  . sodium chloride flush  10-40 mL Intracatheter Q12H   Continuous Infusions: . sodium chloride    . 0.9 % NaCl with KCl 40 mEq / L 125 mL/hr (07/04/19 0501)  . ceFEPime (MAXIPIME) IV    . vancomycin  LOS: 1 day        Abdulkareem Badolato Gerome Apley, MD

## 2019-07-04 NOTE — Consult Note (Signed)
Referring Provider:  Primary Care Physician:  Seward Carol, MD Primary Gastroenterologist:  Althia Forts  Reason for Consultation:  Crohn's Disease  HPI: Katrina Boyer is a 69 y.o. female with history of Crohn's disease (colonic, perianal involvement, on TPN), recurrent C. difficile, CKD stage III, and cognitive impairment hospitalized for fever, weakness, lethargy presenting for consultation of Crohn's disease.  Due to patient's cognitive impairment, she was unable to provide history.  Patient's son was in the room but did not know much about patient's prior medications or current symptoms.  She believes her last bowel movement was sometime this morning, but she was unable to report consistency or whether or not there was blood present.  She does endorse diffuse abdominal pain and states she is "sleepy." She is not currently on any medications for Crohn's disease.  In May 2020, she was found to have an intramural sigmoid abscess.  Prior to this, she was on Remicade.  She was treated with antibiotics and started on TPN.  Colonoscopy showed severe mucosal congestion, possible fistula, and the scope was unable to be passed above 20 cm.  The plan was to proceed with surgical intervention (end colostomy versus loop colostomy) with Dr. Launa Flight at [facility].  However, patient decided not to proceed with the procedure, stating she wanted to wait until she was out of the nursing facility.    CT without contrast on 06/17/2019 showed the following: -Progression of abnormal sigmoid colon since November, favored due to inflammatory bowel disease affecting the distal descending and sigmoid. -There is increased long segment circumferential colonic wall thickening, abundant regional mesenteric inflammation, evidence of a CONTAINED PERFORATION into the sigmoid mesentery, and possible fistula formation to distal small bowel. The inflammation is also inseparable from the uterine fundus and left adnexa.  No drainable fluid  collection is evident.  Past Medical History:  Diagnosis Date  . Bacteremia   . Cognitive communication disorder   . Coordination abnormal   . Crohn disease ([place])   . Muscle weakness (generalized)   . Unsteadiness on feet     History reviewed. No pertinent surgical history.  Prior to Admission medications   Medication Sig Start Date End Date Taking? Authorizing Provider  acetaminophen (TYLENOL) 325 MG tablet Take 650 mg by mouth every 4 (four) hours as needed for mild pain.   Yes [provider]  albuterol (PROVENTIL) (2.5 MG/3ML) 0.083% nebulizer solution Take 2.5 mg by nebulization every 4 (four) hours as needed for wheezing or shortness of breath.   Yes [provider]  buPROPion (WELLBUTRIN XL) 300 MG 24 hr tablet Take 300 mg by mouth daily.   Yes [provider]  Carboxymethylcellulose Sodium (ARTIFICIAL TEARS OP) Place 1 drop into both eyes 3 (three) times daily as needed (for dry eyes).   Yes [provider]  cholecalciferol (VITAMIN D) 25 MCG (1000 UNIT) tablet Take 1,000 Units by mouth daily.   Yes [provider]  escitalopram (LEXAPRO) 5 MG tablet Take 5 mg by mouth daily.   Yes [provider]  hyoscyamine (LEVSIN SL) 0.125 MG SL tablet Place 0.125 mg under the tongue every 4 (four) hours as needed for cramping.   Yes [provider]  lidocaine (LIDODERM) 5 % Place 1 patch onto the skin daily. Remove & Discard patch within 12 hours or as directed by MD   Yes [provider]  loperamide (IMODIUM) 2 MG capsule Take 4 mg by mouth 4 (four) times daily as needed for diarrhea or loose stools.  Yes [provider]  Melatonin 3 MG TABS Take 3 mg by mouth at bedtime.   Yes [provider]  Menthol-Methyl Salicylate (MUSCLE RUB) 10-15 % CREA Apply 1 application topically 3 (three) times daily as needed for muscle pain. 02/01/19  Yes Swayze, Ava, DO  methocarbamol (ROBAXIN) 500 MG tablet Take 1  tablet (500 mg total) by mouth every 6 (six) hours as needed for muscle spasms. 02/01/19  Yes Swayze, Ava, DO  ondansetron (ZOFRAN) 4 MG tablet Take 1 tablet (4 mg total) by mouth every 6 (six) hours as needed for nausea. 02/01/19  Yes Swayze, Ava, DO  oxyCODONE (OXY IR/ROXICODONE) 5 MG immediate release tablet Take 1 tablet (5 mg total) by mouth every 4 (four) hours as needed for moderate pain. Patient taking differently: Take 10 mg by mouth every 4 (four) hours as needed for moderate pain.  02/01/19  Yes Swayze, Ava, DO  pantoprazole (PROTONIX) 40 MG tablet Take 40 mg by mouth daily.   Yes [provider]  potassium chloride (KLOR-CON) 10 MEQ tablet Take 10 mEq by mouth daily.   Yes [provider]  pravastatin (PRAVACHOL) 20 MG tablet Take 20 mg by mouth daily.   Yes [provider]  PRESCRIPTION MEDICATION 1 Bag by Pump Prime route See admin instructions. PER MAR: Clinimix TPN IV with multivitamin additive 10 mL/1400 mL/16 Hours infusion via CADD pump, add 10 MVI to each bag daily three times weekly, Tuesday, Thursday, Saturday. New bag to be started daily at 12 pm, stop at 4 am.   Yes [provider]  saccharomyces boulardii (FLORASTOR) 250 MG capsule Take 250 mg by mouth 2 (two) times daily.   Yes [provider]  Zinc Gluconate 100 MG TABS Take 1 tablet by mouth daily.   Yes [provider]  zolpidem (AMBIEN) 5 MG tablet Take 1 tablet (5 mg total) by mouth at bedtime as needed for sleep. 02/01/19  Yes Swayze, Ava, DO  Amino Acids-Protein Hydrolys (FEEDING SUPPLEMENT, PRO-STAT SUGAR FREE 64,) LIQD Take 30 mLs by mouth daily. 02/01/19   Swayze, Ava, DO  Infant Care Products (DERMACLOUD EX) Apply 1 application topically 2 (two) times daily.    [provider]  liver oil-zinc oxide (DESITIN) 40 % ointment Apply topically 2 (two) times daily as needed for irritation. Patient not taking: Reported on 02/26/2019 02/01/19   Swayze, Ava, DO   Menthol-Zinc Oxide (CALMOSEPTINE EX) Apply 1 application topically 2 (two) times daily. Apply to groin/perineum    [provider]  Multiple Vitamin (MULTIVITAMIN WITH MINERALS) TABS tablet Take 1 tablet by mouth daily. Patient not taking: Reported on 02/26/2019 02/01/19   Swayze, Ava, DO    Scheduled Meds: . Chlorhexidine Gluconate Cloth  6 each Topical Daily  . sodium chloride flush  10-40 mL Intracatheter Q12H  . sodium chloride flush  10-40 mL Intracatheter Q12H   Continuous Infusions: . sodium chloride    . ceFEPime (MAXIPIME) IV    . dextrose 5% lactated ringers 75 mL/hr at 07/04/19 1412   PRN Meds:.acetaminophen **OR** acetaminophen, HYDROmorphone (DILAUDID) injection, ipratropium-albuterol, ondansetron **OR** ondansetron (ZOFRAN) IV, sodium chloride flush, sodium chloride flush  Allergies as of 06/19/2019 - Review Complete 07/02/2019  Allergen Reaction Noted  . Piperacillin-tazobactam in dex  01/24/2019  . Metronidazole  01/24/2019  . Gabapentin  01/24/2019  . Mercaptopurine Nausea Only 01/24/2019  . Ciprofloxacin Rash 01/24/2019    History reviewed. No pertinent family history.  Social History   Socioeconomic History  .  Marital status: Divorced    Spouse name: Not on file  . Number of children: Not on file  . Years of education: Not on file  . Highest education level: Not on file  Occupational History  . Not on file  Tobacco Use  . Smoking status: Former Research scientist (life sciences)  . Smokeless tobacco: Never Used  Substance and Sexual Activity  . Alcohol use: Not Currently  . Drug use: Never  . Sexual activity: Not Currently  Other Topics Concern  . Not on file  Social History Narrative  . Not on file   Social Determinants of Health   Financial Resource Strain:   . Difficulty of Paying Living Expenses:   Food Insecurity:   . Worried About Charity fundraiser in the Last Year:   . Arboriculturist in the Last Year:   Transportation Needs:   . Lexicographer (Medical):   Marland Kitchen Lack of Transportation (Non-Medical):   Physical Activity:   . Days of Exercise per Week:   . Minutes of Exercise per Session:   Stress:   . Feeling of Stress :   Social Connections:   . Frequency of Communication with Friends and Family:   . Frequency of Social Gatherings with Friends and Family:   . Attends Religious Services:   . Active Member of Clubs or Organizations:   . Attends Archivist Meetings:   Marland Kitchen Marital Status:   Intimate Partner Violence:   . Fear of Current or Ex-Partner:   . Emotionally Abused:   Marland Kitchen Physically Abused:   . Sexually Abused:     Review of Systems: All negative except as stated above in HPI.  Physical Exam: Vital signs: Vitals:   07/04/19 0808 07/04/19 1222  BP: (!) 89/55 99/61  Pulse: 96 93  Resp: 20 20  Temp: 98.2 F (36.8 C) 98.1 F (36.7 C)  SpO2: 99% 100%   Last BM Date: 07/04/19 General:   Lethargic, thin, pleasant and cooperative, resting in bed in NAD Head: normocephalic, atraumatic Eyes: anicteric sclera, conjunctival pallor. ENT: oropharynx clear Neck: supple, nontender Lungs:  Clear throughout to auscultation.   No wheezes, crackles, or rhonchi. No acute distress. Heart:  Regular rate and rhythm; no murmurs, clicks, rubs,  or gallops. Abdomen: Diffuse tenderness to light palpation with guarding, mild distention, +BS Rectal:  Deferred Ext: no edema  GI:  Lab Results: Recent Labs    07/11/2019 1539 07/04/19 0250  WBC 8.5 17.5*  HGB 7.0* 7.0*  HCT 24.3* 24.8*  PLT 315 240   BMET Recent Labs    06/18/2019 1539 07/04/19 0250  NA 137 143  K 2.6* 3.5  CL 104 115*  CO2 21* 15*  GLUCOSE 124* 78  BUN 33* 32*  CREATININE 1.43* 1.77*  CALCIUM 8.1* 7.5*   LFT Recent Labs    07/04/19 0250  PROT 5.8*  ALBUMIN 1.1*  AST 141*  ALT 57*  ALKPHOS 227*  BILITOT 1.5*   PT/INR Recent Labs    07/06/2019 1712  LABPROT 16.9*  INR 1.4*     Studies/Results: CT ABDOMEN PELVIS WO  CONTRAST  Result Date: 06/21/2019 CLINICAL DATA:  69 year old female with Crohn disease. Increased lethargy and abdominal distension. Unexplained anemia. EXAM: CT ABDOMEN AND PELVIS WITHOUT CONTRAST TECHNIQUE: Multidetector CT imaging of the abdomen and pelvis was performed following the standard protocol without IV contrast. COMPARISON:  CT Abdomen and Pelvis 02/26/2019. FINDINGS: Lower chest: Respiratory motion. Mild lung base scarring or atelectasis but  no definite lung base pulmonary inflammation. Trace layering pleural effusions. Mild cardiomegaly. No pericardial effusion. Hepatobiliary: Chronically absent gallbladder. Negative noncontrast liver. Pancreas: Chronically atrophied. Spleen: Stable mild splenomegaly. Adrenals/Urinary Tract: Negative adrenal glands. Chronic left hydronephrosis and hydroureter with urothelial thickening. Stable inflammatory stranding at the left renal pelvis. A large exophytic left renal cyst with simple fluid density is stable. Mild left nephrolithiasis. No obstructive uropathy on the right although chronic right nephrolithiasis and areas of right renal cortical scarring. The bladder was abnormal in November but has a more normal appearance today, and does not appear involved in the pelvic abnormality emanating from the sigmoid colon described below. Stomach/Bowel: No free intraperitoneal fluid. There is trace free fluid in the left abdomen. Negative stomach and proximal small bowel. There is oral contrast mixed with retained stool throughout the colon. In the anterior mid lower abdomen there is a surgical anastomosis at what appears to in the be distal small bowel. The native terminal ileum seems to remain in place along with a normal appearing appendix seen on coronal image 39. The TI is within normal limits. Decompressed transverse colon. Redundant descending colon containing stool mixed with contrast, and the large bowel becomes abnormal in the distal descending segment. From  series 3, image 49 throughout the distal sigmoid, the large bowel is diffusely thick-walled, indistinct, and appears inflamed. There is associated inflammation along the left pelvic side wall, in the region of the left adnexa. There are multiple superimposed small areas of suspicious extraluminal gas (such as on sagittal image 61) and there is suspicion of a fistulous connection from the abnormal sigmoid colon to the distal small bowel such as on series 3, image 64. The abnormal sigmoid is also inseparable from the dorsal uterine fundus. See sagittal image 64. Vascular/Lymphatic: Vascular patency is not evaluated in the absence of IV contrast. Extensive Aortoiliac calcified atherosclerosis. No lymphadenopathy identified. Reproductive: The uterus is identified on sagittal image 59, but is difficult to delineate from the abnormal sigmoid colon. Normal right adnexa. Left adnexa cannot be delineated from the abnormal sigmoid colon. Other: No pelvic free fluid. Musculoskeletal: Chronic mid lumbar vertebral body augmentation. Chronic appearing lower thoracic vertebral endplate compression. Osteopenia. No acute osseous abnormality identified. IMPRESSION: 1. Progression of abnormal sigmoid colon since November, favored due to inflammatory bowel disease affecting the distal descending and sigmoid. There is increased long segment circumferential colonic wall thickening, abundant regional mesenteric inflammation, evidence of a CONTAINED PERFORATION into the sigmoid mesentery, and possible fistula formation to distal small bowel. The inflammation is also inseparable from the uterine fundus and left adnexa. No drainable fluid collection is evident. 2. Chronic inflammation of the left kidney and ureter with a degree of obstructive uropathy. And this may be indicative of distal left ureteral involvement in #1. 3. Mild cardiomegaly. Trace pleural effusions. Aortic Atherosclerosis (ICD10-I70.0). Chronic nephrolithiasis. Mild chronic  splenomegaly. Electronically Signed   By: Genevie Ann M.D.   On: 07/02/2019 20:48   DG Chest Port 1 View  Result Date: 06/24/2019 CLINICAL DATA:  Fever. EXAM: PORTABLE CHEST 1 VIEW COMPARISON:  April 12, 2019 FINDINGS: The right-sided PICC line seen on the prior study has been removed. A left-sided PICC line is seen with its distal tip noted near the junction of the superior vena cava and right atrium. Mild, chronic appearing increased lung markings are seen without evidence of acute infiltrate, pleural effusion or pneumothorax. The heart size and mediastinal contours are within normal limits. The visualized skeletal structures are unremarkable. IMPRESSION: No  active disease. Electronically Signed   By: Virgina Norfolk M.D.   On: 06/23/2019 17:27    Impression/Plan: Crohn's disease with perianal involvement, refractory to Remicade, not currently on medication. Due to concerns for active infection will hold off on starting steroids at this time. Would not recommend restarting Remicade with concern for infection. She needs bowel surgery and unfortunate she postponed the planned surgery at [facility]. Continue TPN for nutrition. Supportive care. Will f/u.    LOS: 1 day   Lear Ng  07/04/2019, 2:58 PM  Questions please call [phone]

## 2019-07-04 NOTE — Progress Notes (Signed)
Pt was admitted from [facility].  Her son is present and reports hx of ileostomy with take down last year, and recommendation from [facility] for colostomy.  During exam pt is A&O x3, follows commands and responds appropriately to questions.  SMAE.    Requesting water, reports she feels "very hot."  Temp 99.3.  Cardiac: tachycardic, no MRG. Resp: Mild wheezes bilateral lower lobes. Abdomen: Distended and tender, Bowel sounds present all quadrants.  No guarding. Skin: no rashes, ulceration.  Mild erythema right heel.  Lips dry.  Pt has received K replacement, 1 unit of PRBC, and has been seen by Gen Surgery Dr Kae Heller.

## 2019-07-04 NOTE — Progress Notes (Signed)
Date and time of tPA/Alteplase insertion: 3/24 @1100   Theodosia Bahena, Gillermina Phy, RN

## 2019-07-04 NOTE — Progress Notes (Addendum)
Date and time of tPA/Alteplase removal: 07/04/19 @ 1345; [facility]  Loura Pitt, Gillermina Phy, Therapist, sports

## 2019-07-04 NOTE — Progress Notes (Signed)
[facility] Progress Note     Subjective: CC-  Son at bedside.  Patient states that she feels bleh. Denies worsening abdominal pain, nausea, vomiting. She has had multiple loose BMs this morning. Feels short of breath. Denies cough or chest pain.  WBC up 17.5, afebrile. Creatinine and LFTs also up.  Objective: Vital signs in last 24 hours: Temp:  [98.1 F (36.7 C)-99.9 F (37.7 C)] 98.2 F (36.8 C) (03/24 0808) Pulse Rate:  [96-122] 96 (03/24 0808) Resp:  [18-33] 20 (03/24 0808) BP: (74-135)/(40-97) 89/55 (03/24 0808) SpO2:  [90 %-100 %] 99 % (03/24 0808) Weight:  [46.7 kg-53 kg] 53 kg (03/24 0212) Last BM Date: 07/04/19  Intake/Output from previous day: 03/23 0701 - 03/24 0700 In: 2411 [I.V.:2033.7; Blood:315; IV Piggyback:62.2] Out: -  Intake/Output this shift: No intake/output data recorded.  PE: Gen:  Alert, NAD, pleasant HEENT: EOM's intact, pupils equal and round Card:  RRR, no M/G/R heard, 2+ DP pulses Pulm:  CTAB, no W/R/R, rate and effort normal Abd: Soft, ND, +BS, mild tenderness periumbilical and LLQ without guarding, no peritonitis. no HSM, no hernia Ext:  no BUE/BLE edema, calves soft and nontender Psych: A&Ox3 but does seem a bit confused Skin: no rashes noted, warm and dry  Lab Results:  Recent Labs    06/24/2019 1539 07/04/19 0250  WBC 8.5 17.5*  HGB 7.0* 7.0*  HCT 24.3* 24.8*  PLT 315 240   BMET Recent Labs    06/21/2019 1539 07/04/19 0250  NA 137 143  K 2.6* 3.5  CL 104 115*  CO2 21* 15*  GLUCOSE 124* 78  BUN 33* 32*  CREATININE 1.43* 1.77*  CALCIUM 8.1* 7.5*   PT/INR Recent Labs    06/20/2019 1712  LABPROT 16.9*  INR 1.4*   CMP     Component Value Date/Time   NA 143 07/04/2019 0250   K 3.5 07/04/2019 0250   CL 115 (H) 07/04/2019 0250   CO2 15 (L) 07/04/2019 0250   GLUCOSE 78 07/04/2019 0250   BUN 32 (H) 07/04/2019 0250   CREATININE 1.77 (H) 07/04/2019 0250   CALCIUM 7.5 (L) 07/04/2019 0250   PROT 5.8 (L)  07/04/2019 0250   ALBUMIN 1.1 (L) 07/04/2019 0250   AST 141 (H) 07/04/2019 0250   ALT 57 (H) 07/04/2019 0250   ALKPHOS 227 (H) 07/04/2019 0250   BILITOT 1.5 (H) 07/04/2019 0250   GFRNONAA 29 (L) 07/04/2019 0250   GFRAA 34 (L) 07/04/2019 0250   Lipase  No results found for: LIPASE     Studies/Results: CT ABDOMEN PELVIS WO CONTRAST  Result Date: 06/29/2019 CLINICAL DATA:  69 year old female with Crohn disease. Increased lethargy and abdominal distension. Unexplained anemia. EXAM: CT ABDOMEN AND PELVIS WITHOUT CONTRAST TECHNIQUE: Multidetector CT imaging of the abdomen and pelvis was performed following the standard protocol without IV contrast. COMPARISON:  CT Abdomen and Pelvis 02/26/2019. FINDINGS: Lower chest: Respiratory motion. Mild lung base scarring or atelectasis but no definite lung base pulmonary inflammation. Trace layering pleural effusions. Mild cardiomegaly. No pericardial effusion. Hepatobiliary: Chronically absent gallbladder. Negative noncontrast liver. Pancreas: Chronically atrophied. Spleen: Stable mild splenomegaly. Adrenals/Urinary Tract: Negative adrenal glands. Chronic left hydronephrosis and hydroureter with urothelial thickening. Stable inflammatory stranding at the left renal pelvis. A large exophytic left renal cyst with simple fluid density is stable. Mild left nephrolithiasis. No obstructive uropathy on the right although chronic right nephrolithiasis and areas of right renal cortical scarring. The bladder was abnormal in November but has a more  normal appearance today, and does not appear involved in the pelvic abnormality emanating from the sigmoid colon described below. Stomach/Bowel: No free intraperitoneal fluid. There is trace free fluid in the left abdomen. Negative stomach and proximal small bowel. There is oral contrast mixed with retained stool throughout the colon. In the anterior mid lower abdomen there is a surgical anastomosis at what appears to in the be  distal small bowel. The native terminal ileum seems to remain in place along with a normal appearing appendix seen on coronal image 39. The TI is within normal limits. Decompressed transverse colon. Redundant descending colon containing stool mixed with contrast, and the large bowel becomes abnormal in the distal descending segment. From series 3, image 49 throughout the distal sigmoid, the large bowel is diffusely thick-walled, indistinct, and appears inflamed. There is associated inflammation along the left pelvic side wall, in the region of the left adnexa. There are multiple superimposed small areas of suspicious extraluminal gas (such as on sagittal image 61) and there is suspicion of a fistulous connection from the abnormal sigmoid colon to the distal small bowel such as on series 3, image 64. The abnormal sigmoid is also inseparable from the dorsal uterine fundus. See sagittal image 64. Vascular/Lymphatic: Vascular patency is not evaluated in the absence of IV contrast. Extensive Aortoiliac calcified atherosclerosis. No lymphadenopathy identified. Reproductive: The uterus is identified on sagittal image 59, but is difficult to delineate from the abnormal sigmoid colon. Normal right adnexa. Left adnexa cannot be delineated from the abnormal sigmoid colon. Other: No pelvic free fluid. Musculoskeletal: Chronic mid lumbar vertebral body augmentation. Chronic appearing lower thoracic vertebral endplate compression. Osteopenia. No acute osseous abnormality identified. IMPRESSION: 1. Progression of abnormal sigmoid colon since November, favored due to inflammatory bowel disease affecting the distal descending and sigmoid. There is increased long segment circumferential colonic wall thickening, abundant regional mesenteric inflammation, evidence of a CONTAINED PERFORATION into the sigmoid mesentery, and possible fistula formation to distal small bowel. The inflammation is also inseparable from the uterine fundus and  left adnexa. No drainable fluid collection is evident. 2. Chronic inflammation of the left kidney and ureter with a degree of obstructive uropathy. And this may be indicative of distal left ureteral involvement in #1. 3. Mild cardiomegaly. Trace pleural effusions. Aortic Atherosclerosis (ICD10-I70.0). Chronic nephrolithiasis. Mild chronic splenomegaly. Electronically Signed   By: Genevie Ann M.D.   On: 06/15/2019 20:48   DG Chest Port 1 View  Result Date: 06/14/2019 CLINICAL DATA:  Fever. EXAM: PORTABLE CHEST 1 VIEW COMPARISON:  April 12, 2019 FINDINGS: The right-sided PICC line seen on the prior study has been removed. A left-sided PICC line is seen with its distal tip noted near the junction of the superior vena cava and right atrium. Mild, chronic appearing increased lung markings are seen without evidence of acute infiltrate, pleural effusion or pneumothorax. The heart size and mediastinal contours are within normal limits. The visualized skeletal structures are unremarkable. IMPRESSION: No active disease. Electronically Signed   By: Virgina Norfolk M.D.   On: 07/06/2019 17:27    Anti-infectives: Anti-infectives (From admission, onward)   Start     Dose/Rate Route Frequency Ordered Stop   07/04/19 1800  vancomycin (VANCOREADY) IVPB 500 mg/100 mL     500 mg 100 mL/hr over 60 Minutes Intravenous Every 24 hours 06/15/2019 1741     07/04/19 1800  ceFEPIme (MAXIPIME) 2 g in sodium chloride 0.9 % 100 mL IVPB     2 g 200 mL/hr over  30 Minutes Intravenous Every 24 hours 07/11/2019 1741     07/01/2019 1730  ceFEPIme (MAXIPIME) 2 g in sodium chloride 0.9 % 100 mL IVPB     2 g 200 mL/hr over 30 Minutes Intravenous  Once 06/27/2019 1726 06/19/2019 1845   06/11/2019 1715  vancomycin (VANCOCIN) IVPB 1000 mg/200 mL premix     1,000 mg 200 mL/hr over 60 Minutes Intravenous  Once 06/21/2019 1712 07/02/2019 1845   06/26/2019 1715  aztreonam (AZACTAM) 2 g in sodium chloride 0.9 % 100 mL IVPB  Status:  Discontinued     2  g 200 mL/hr over 30 Minutes Intravenous  Once 06/22/2019 1714 07/01/2019 1726       Assessment/Plan GERD HLD Acute on chronic anemia AKI on CKD Severe malnutrition - albumin 1.2, check prealbumin  Sepsis  Crohns disease with perianal involvement (not on medications) Hx diverting ostomy s/p takedown 2011 Chronic sigmoid colon intramural abscess, obstruction, 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 Does not need emergent surgery today. Ucx and Bcx pending. Check c diff. Trend lactic acid as this was elevated yesterday. Continue broad spectrum antibiotics and resuscitation. Will ask GI to see to help manage her Crohns. May ultimately need surgery this admission but hopefully can avoid if possible. She is very malnourished and we would likely plan diverting ostomy at most.  ID - maxipime 3/23>>, vancomycin 3/23>> FEN - IVF, NPO VTE - SCDs Foley - none Follow up - TBD   LOS: 1 day    Wellington Hampshire, [facility] 07/04/2019, 11:23 AM Please see Amion for pager number during day hours 7:00am-4:30pm

## 2019-07-05 DIAGNOSIS — R41841 Cognitive communication deficit: Secondary | ICD-10-CM

## 2019-07-05 DIAGNOSIS — N179 Acute kidney failure, unspecified: Secondary | ICD-10-CM

## 2019-07-05 LAB — BASIC METABOLIC PANEL
Anion gap: 9 (ref 5–15)
BUN: 31 mg/dL — ABNORMAL HIGH (ref 8–23)
CO2: 16 mmol/L — ABNORMAL LOW (ref 22–32)
Calcium: 7.6 mg/dL — ABNORMAL LOW (ref 8.9–10.3)
Chloride: 116 mmol/L — ABNORMAL HIGH (ref 98–111)
Creatinine, Ser: 1.48 mg/dL — ABNORMAL HIGH (ref 0.44–1.00)
GFR calc Af Amer: 42 mL/min — ABNORMAL LOW (ref >60)
GFR calc non Af Amer: 37 mL/min — ABNORMAL LOW (ref >60)
Glucose, Bld: 305 mg/dL — ABNORMAL HIGH (ref 70–99)
Potassium: 3.9 mmol/L (ref 3.5–5.1)
Sodium: 141 mmol/L (ref 135–145)

## 2019-07-05 LAB — DIFFERENTIAL
Abs Immature Granulocytes: 0.16 10*3/uL — ABNORMAL HIGH (ref 0.00–0.07)
Basophils Absolute: 0 10*3/uL (ref 0.0–0.1)
Basophils Relative: 0 %
Eosinophils Absolute: 0.2 10*3/uL (ref 0.0–0.5)
Eosinophils Relative: 2 %
Immature Granulocytes: 2 %
Lymphocytes Relative: 15 %
Lymphs Abs: 1.3 10*3/uL (ref 0.7–4.0)
Monocytes Absolute: 0.2 10*3/uL (ref 0.1–1.0)
Monocytes Relative: 2 %
Neutro Abs: 7.2 10*3/uL (ref 1.7–7.7)
Neutrophils Relative %: 79 %

## 2019-07-05 LAB — TRIGLYCERIDES: Triglycerides: 150 mg/dL — ABNORMAL HIGH (ref <150)

## 2019-07-05 LAB — PHOSPHORUS
Phosphorus: 2.9 mg/dL (ref 2.5–4.6)
Phosphorus: 1.9 mg/dL — ABNORMAL LOW (ref 2.5–4.6)

## 2019-07-05 LAB — URINE CULTURE: Culture: >=100000 — AB

## 2019-07-05 LAB — COMPREHENSIVE METABOLIC PANEL
ALT: 46 U/L — ABNORMAL HIGH (ref 0–44)
AST: 77 U/L — ABNORMAL HIGH (ref 15–41)
Albumin: 1.1 g/dL — ABNORMAL LOW (ref 3.5–5.0)
Alkaline Phosphatase: 222 U/L — ABNORMAL HIGH (ref 38–126)
Anion gap: 8 (ref 5–15)
BUN: 36 mg/dL — ABNORMAL HIGH (ref 8–23)
CO2: 17 mmol/L — ABNORMAL LOW (ref 22–32)
Calcium: 7.7 mg/dL — ABNORMAL LOW (ref 8.9–10.3)
Chloride: 118 mmol/L — ABNORMAL HIGH (ref 98–111)
Creatinine, Ser: 1.5 mg/dL — ABNORMAL HIGH (ref 0.44–1.00)
GFR calc Af Amer: 42 mL/min — ABNORMAL LOW (ref >60)
GFR calc non Af Amer: 36 mL/min — ABNORMAL LOW (ref >60)
Glucose, Bld: 70 mg/dL (ref 70–99)
Potassium: 3 mmol/L — ABNORMAL LOW (ref 3.5–5.1)
Sodium: 143 mmol/L (ref 135–145)
Total Bilirubin: 2 mg/dL — ABNORMAL HIGH (ref 0.3–1.2)
Total Protein: 5.4 g/dL — ABNORMAL LOW (ref 6.5–8.1)

## 2019-07-05 LAB — CBC
HCT: 22.1 % — ABNORMAL LOW (ref 36.0–46.0)
Hemoglobin: 6.7 g/dL — CL (ref 12.0–15.0)
MCH: 27.5 pg (ref 26.0–34.0)
MCHC: 30.3 g/dL (ref 30.0–36.0)
MCV: 90.6 fL (ref 80.0–100.0)
Platelets: 211 10*3/uL (ref 150–400)
RBC: 2.44 MIL/uL — ABNORMAL LOW (ref 3.87–5.11)
RDW: 16.5 % — ABNORMAL HIGH (ref 11.5–15.5)
WBC: 9.1 10*3/uL (ref 4.0–10.5)
nRBC: 0 % (ref 0.0–0.2)

## 2019-07-05 LAB — PREPARE RBC (CROSSMATCH)

## 2019-07-05 LAB — MAGNESIUM
Magnesium: 1.9 mg/dL (ref 1.7–2.4)
Magnesium: 2.3 mg/dL (ref 1.7–2.4)

## 2019-07-05 MED ORDER — HYOSCYAMINE SULFATE 0.125 MG SL SUBL
0.1250 mg | SUBLINGUAL_TABLET | SUBLINGUAL | Status: DC | PRN
  Filled 2019-07-05: qty 1

## 2019-07-05 MED ORDER — SODIUM CHLORIDE 0.9 % IV SOLN
2.0000 g | Freq: Two times a day (BID) | INTRAVENOUS | Status: DC
  Administered 2019-07-05 – 2019-07-06 (×3): 2 g via INTRAVENOUS
  Filled 2019-07-05 (×3): qty 2

## 2019-07-05 MED ORDER — PANTOPRAZOLE SODIUM 40 MG PO TBEC
40.0000 mg | DELAYED_RELEASE_TABLET | Freq: Every day | ORAL | Status: DC
  Administered 2019-07-05 – 2019-07-08 (×4): 40 mg via ORAL
  Filled 2019-07-05 (×4): qty 1

## 2019-07-05 MED ORDER — ALPRAZOLAM 0.5 MG PO TABS
1.0000 mg | ORAL_TABLET | Freq: Three times a day (TID) | ORAL | Status: DC | PRN
  Administered 2019-07-05 – 2019-07-07 (×4): 1 mg via ORAL
  Filled 2019-07-05 (×4): qty 2

## 2019-07-05 MED ORDER — POTASSIUM PHOSPHATES 15 MMOLE/5ML IV SOLN
10.0000 mmol | Freq: Once | INTRAVENOUS | Status: AC
  Administered 2019-07-06: 10 mmol via INTRAVENOUS
  Filled 2019-07-05: qty 3.33

## 2019-07-05 MED ORDER — ALBUTEROL SULFATE (2.5 MG/3ML) 0.083% IN NEBU: 2.5000 mg | INHALATION_SOLUTION | RESPIRATORY_TRACT | Status: DC | PRN

## 2019-07-05 MED ORDER — ZOLPIDEM TARTRATE 5 MG PO TABS
5.0000 mg | ORAL_TABLET | Freq: Every evening | ORAL | Status: DC | PRN
  Administered 2019-07-06: 5 mg via ORAL
  Filled 2019-07-05: qty 1

## 2019-07-05 MED ORDER — MAGNESIUM SULFATE 2 GM/50ML IV SOLN
2.0000 g | Freq: Once | INTRAVENOUS | Status: AC
  Administered 2019-07-05: 2 g via INTRAVENOUS
  Filled 2019-07-05: qty 50

## 2019-07-05 MED ORDER — SACCHAROMYCES BOULARDII 250 MG PO CAPS
250.0000 mg | ORAL_CAPSULE | Freq: Two times a day (BID) | ORAL | Status: DC
  Administered 2019-07-05 – 2019-07-08 (×7): 250 mg via ORAL
  Filled 2019-07-05 (×7): qty 1

## 2019-07-05 MED ORDER — ESCITALOPRAM OXALATE 10 MG PO TABS
5.0000 mg | ORAL_TABLET | Freq: Every day | ORAL | Status: DC
  Administered 2019-07-05 – 2019-07-08 (×4): 5 mg via ORAL
  Filled 2019-07-05 (×4): qty 1

## 2019-07-05 MED ORDER — MELATONIN 3 MG PO TABS
3.0000 mg | ORAL_TABLET | Freq: Every day | ORAL | Status: DC
  Administered 2019-07-06 – 2019-07-07 (×3): 3 mg via ORAL
  Filled 2019-07-05 (×6): qty 1

## 2019-07-05 MED ORDER — BUPROPION HCL ER (XL) 150 MG PO TB24
300.0000 mg | ORAL_TABLET | Freq: Every day | ORAL | Status: DC
  Administered 2019-07-05 – 2019-07-08 (×4): 300 mg via ORAL
  Filled 2019-07-05 (×4): qty 2

## 2019-07-05 MED ORDER — TRAVASOL 10 % IV SOLN
INTRAVENOUS | Status: AC
  Filled 2019-07-05: qty 835.2

## 2019-07-05 MED ORDER — PRAVASTATIN SODIUM 10 MG PO TABS
20.0000 mg | ORAL_TABLET | Freq: Every day | ORAL | Status: DC
  Administered 2019-07-05 – 2019-07-08 (×4): 20 mg via ORAL
  Filled 2019-07-05 (×4): qty 2

## 2019-07-05 MED ORDER — LOPERAMIDE HCL 2 MG PO CAPS
4.0000 mg | ORAL_CAPSULE | Freq: Four times a day (QID) | ORAL | Status: DC | PRN
  Administered 2019-07-05 – 2019-07-07 (×4): 4 mg via ORAL
  Filled 2019-07-05 (×4): qty 2

## 2019-07-05 MED ORDER — OXYCODONE HCL 5 MG PO TABS
10.0000 mg | ORAL_TABLET | ORAL | Status: DC | PRN
  Administered 2019-07-06 – 2019-07-07 (×2): 10 mg via ORAL
  Filled 2019-07-05 (×2): qty 2

## 2019-07-05 MED ORDER — POTASSIUM CHLORIDE 10 MEQ/50ML IV SOLN
10.0000 meq | INTRAVENOUS | Status: AC
  Administered 2019-07-05 (×6): 10 meq via INTRAVENOUS
  Filled 2019-07-05 (×6): qty 50

## 2019-07-05 MED ORDER — VITAMIN D 25 MCG (1000 UNIT) PO TABS
1000.0000 [IU] | ORAL_TABLET | Freq: Every day | ORAL | Status: DC
  Administered 2019-07-05 – 2019-07-08 (×4): 1000 [IU] via ORAL
  Filled 2019-07-05 (×4): qty 1

## 2019-07-05 MED ORDER — ZINC OXIDE 40 % EX OINT
TOPICAL_OINTMENT | Freq: Three times a day (TID) | CUTANEOUS | Status: DC | PRN
  Filled 2019-07-05: qty 57

## 2019-07-05 MED ORDER — PRO-STAT SUGAR FREE PO LIQD
30.0000 mL | Freq: Every day | ORAL | Status: DC
  Administered 2019-07-05 – 2019-07-08 (×3): 30 mL via ORAL
  Filled 2019-07-05 (×3): qty 30

## 2019-07-05 MED ORDER — ZINC SULFATE 220 (50 ZN) MG PO CAPS
220.0000 mg | ORAL_CAPSULE | Freq: Every day | ORAL | Status: DC
  Administered 2019-07-05 – 2019-07-08 (×4): 220 mg via ORAL
  Filled 2019-07-05 (×4): qty 1

## 2019-07-05 MED ORDER — METHOCARBAMOL 500 MG PO TABS: 500.0000 mg | ORAL_TABLET | Freq: Four times a day (QID) | ORAL | Status: DC | PRN

## 2019-07-05 MED ORDER — POLYVINYL ALCOHOL 1.4 % OP SOLN: 2.0000 [drp] | Freq: Three times a day (TID) | OPHTHALMIC | Status: DC | PRN

## 2019-07-05 MED ORDER — SODIUM CHLORIDE 0.9% IV SOLUTION: Freq: Once | INTRAVENOUS | Status: DC

## 2019-07-05 NOTE — Progress Notes (Signed)
CRITICAL VALUE ALERT  Critical Value:  Hemoglobin ; 6.7  Date & Time Notied:  07/05/2019 at [place]  Provider Notified: NP Sharlet Salina  Orders Received/Actions taken:

## 2019-07-05 NOTE — Progress Notes (Addendum)
PHARMACY - TOTAL PARENTERAL NUTRITION CONSULT NOTE   Indication: Chronic; intolerance to enteral feeding  Patient Measurements: Height: 5' 4"  (162.6 cm) Weight: 116 lb 13.5 oz (53 kg) IBW/kg (Calculated) : 54.7 TPN AdjBW (KG): 53 Body mass index is 20.06 kg/m.  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 She was admitted from [facility] facility with concern for sepsis. CT on 3/23 shows contained perforation in the sigmoid mesentery and possible fistula formation to distal small bowel.  No need for emergent surgery per Surgery team but may perform diverting ostomy. She is followed by [facility] and was to have surgery last week but it was post-poned by patient. Pharmacy consulted to continue TPN management.   Glucose / Insulin: CBG 70s (NPO on D5 with LR at 75 ml/hr) Electrolytes: K 3- will replace, Mg 1.9- will replace, Phos 2.9, CO2 low 17, Cl elevated at 118, CoCa 10.  Renal: SCr down to 1.50, BUN up 36.  LFTs / TGs: AST/ALT elevated on admission - now trending down (77/46), Tbili up 2, TG 150. No sign of jaundice per RN.  Prealbumin / albumin: Prealbumin <5 / Albumin 1.1 Intake / Output; MIVF: UOP 0.3 mL/kg/hr + some unmeasured occurrences. Lots of liquid/mushy stools (x4 + per RN). Receiving blood this AM for Hgb GI Imaging: none since resuming TPN Surgeries / Procedures: none since resuming TPN  Central access: PICC (placed 04/18/19) TPN start date: Restart 3/25    Nutritional Goals (per RD recommendation on pending): KCal: 1550-1850 Protein: 85-100 g (Pharmacist estimated,presumed increased needs with current stressed state)  Current Nutrition:  NPO with ice chips only TPN - Chronic, has only missed one day  Chronic Concentrated TPN Formula (from [facility], [place]): Infuse 1400  mL over 16 hours with 1 hour taper up and down:  65g AA, 240g Dextrose with 68m trace elements, 215 mEq Na, 30 mEq K, 6 mmol Phos, no Calcium, and 10 mEq Mag, and CJH:ERDEYCX2:1. 50g Lipids four times per week. Providing 1576 kcals on lipid days and 1076 kcal on non-lipid days (average 1362 kcals/day). *Per AClydePharmacist, patient has been on TPN for months with little improvement in weight. Patient had been sensitive to electrolytes but lately has been requiring more. Patient was also on a regular diet in addition to TPN up until admission.   Plan:  Restart cyclic TPN at 14481PM : Infuse 1440 mL over 18 hours, rate of 42 ml/hr x 1 hour, then 85 ml/hr x 16 hrs, then 42 ml/hr x 1 hour, providing 84g AA, 259g CHO and 40g ILE for a total of  1617 kCal, meeting ~100% of patient needs Will plan to transition to 16 hrs if tolerates. Electrolytes in TPN: 120 mEq/L Na (decrease), 40 mEq/L K (increase), no Calcium, 8 mEq/L Mg, and 5 mmol/L Phos, adjust Cl:Ac at 1:2 Add MVI on MWF only due to nProducer, television/film/video  Add trace elements daily to TPN. Monitor for any signs of jaundice with elevated bilirubin - if jaundice, will reduce trace elements to half.   TPN labs Mon/Thurs  KCl 10 mEq IV x6 today. Magnesium 2 g IV x1 today.  Will recheck BMET/Mg/Phos this PM and replace if needed.   JSloan Leiter PharmD, BCPS, BCCCP Clinical Pharmacist Please refer to [facility]for MMiernumbers 07/05/2019,8:25 AM

## 2019-07-05 NOTE — Progress Notes (Signed)
Subjective/Chief Complaint: Confused this am, states she did have bm   Objective: Vital signs in last 24 hours: Temp:  [97.5 F (36.4 C)-98.1 F (36.7 C)] 98 F (36.7 C) (03/25 0800) Pulse Rate:  [82-94] 82 (03/25 0800) Resp:  [14-25] 14 (03/25 0800) BP: (94-107)/(54-68) 95/58 (03/25 0800) SpO2:  [98 %-100 %] 100 % (03/25 0800) Last BM Date: 07/04/19  Intake/Output from previous day: 03/24 0701 - 03/25 0700 In: 2032.1 [I.V.:2032.1] Out: 400 [Urine:400] Intake/Output this shift: No intake/output data recorded.  GI: moderate distention, few bs, mildly tender throughout  Lab Results:  Recent Labs    07/04/19 0250 07/05/19 0353  WBC 17.5* 9.1  HGB 7.0* 6.7*  HCT 24.8* 22.1*  PLT 240 211   BMET Recent Labs    07/04/19 0250 07/05/19 0353  NA 143 143  K 3.5 3.0*  CL 115* 118*  CO2 15* 17*  GLUCOSE 78 70  BUN 32* 36*  CREATININE 1.77* 1.50*  CALCIUM 7.5* 7.7*   PT/INR Recent Labs    07/06/2019 1712  LABPROT 16.9*  INR 1.4*   ABG No results for input(s): PHART, HCO3 in the last 72 hours.  Invalid input(s): PCO2, PO2  Studies/Results: CT ABDOMEN PELVIS WO CONTRAST  Result Date: 07/01/2019 CLINICAL DATA:  69 year old female with Crohn disease. Increased lethargy and abdominal distension. Unexplained anemia. EXAM: CT ABDOMEN AND PELVIS WITHOUT CONTRAST TECHNIQUE: Multidetector CT imaging of the abdomen and pelvis was performed following the standard protocol without IV contrast. COMPARISON:  CT Abdomen and Pelvis 02/26/2019. FINDINGS: Lower chest: Respiratory motion. Mild lung base scarring or atelectasis but no definite lung base pulmonary inflammation. Trace layering pleural effusions. Mild cardiomegaly. No pericardial effusion. Hepatobiliary: Chronically absent gallbladder. Negative noncontrast liver. Pancreas: Chronically atrophied. Spleen: Stable mild splenomegaly. Adrenals/Urinary Tract: Negative adrenal glands. Chronic left hydronephrosis and hydroureter  with urothelial thickening. Stable inflammatory stranding at the left renal pelvis. A large exophytic left renal cyst with simple fluid density is stable. Mild left nephrolithiasis. No obstructive uropathy on the right although chronic right nephrolithiasis and areas of right renal cortical scarring. The bladder was abnormal in November but has a more normal appearance today, and does not appear involved in the pelvic abnormality emanating from the sigmoid colon described below. Stomach/Bowel: No free intraperitoneal fluid. There is trace free fluid in the left abdomen. Negative stomach and proximal small bowel. There is oral contrast mixed with retained stool throughout the colon. In the anterior mid lower abdomen there is a surgical anastomosis at what appears to in the be distal small bowel. The native terminal ileum seems to remain in place along with a normal appearing appendix seen on coronal image 39. The TI is within normal limits. Decompressed transverse colon. Redundant descending colon containing stool mixed with contrast, and the large bowel becomes abnormal in the distal descending segment. From series 3, image 49 throughout the distal sigmoid, the large bowel is diffusely thick-walled, indistinct, and appears inflamed. There is associated inflammation along the left pelvic side wall, in the region of the left adnexa. There are multiple superimposed small areas of suspicious extraluminal gas (such as on sagittal image 61) and there is suspicion of a fistulous connection from the abnormal sigmoid colon to the distal small bowel such as on series 3, image 64. The abnormal sigmoid is also inseparable from the dorsal uterine fundus. See sagittal image 64. Vascular/Lymphatic: Vascular patency is not evaluated in the absence of IV contrast. Extensive Aortoiliac calcified atherosclerosis. No lymphadenopathy identified. Reproductive:  The uterus is identified on sagittal image 59, but is difficult to delineate  from the abnormal sigmoid colon. Normal right adnexa. Left adnexa cannot be delineated from the abnormal sigmoid colon. Other: No pelvic free fluid. Musculoskeletal: Chronic mid lumbar vertebral body augmentation. Chronic appearing lower thoracic vertebral endplate compression. Osteopenia. No acute osseous abnormality identified. IMPRESSION: 1. Progression of abnormal sigmoid colon since November, favored due to inflammatory bowel disease affecting the distal descending and sigmoid. There is increased long segment circumferential colonic wall thickening, abundant regional mesenteric inflammation, evidence of a CONTAINED PERFORATION into the sigmoid mesentery, and possible fistula formation to distal small bowel. The inflammation is also inseparable from the uterine fundus and left adnexa. No drainable fluid collection is evident. 2. Chronic inflammation of the left kidney and ureter with a degree of obstructive uropathy. And this may be indicative of distal left ureteral involvement in #1. 3. Mild cardiomegaly. Trace pleural effusions. Aortic Atherosclerosis (ICD10-I70.0). Chronic nephrolithiasis. Mild chronic splenomegaly. Electronically Signed   By: Genevie Ann M.D.   On: 07/08/2019 20:48   DG Chest Port 1 View  Result Date: 07/05/2019 CLINICAL DATA:  Fever. EXAM: PORTABLE CHEST 1 VIEW COMPARISON:  April 12, 2019 FINDINGS: The right-sided PICC line seen on the prior study has been removed. A left-sided PICC line is seen with its distal tip noted near the junction of the superior vena cava and right atrium. Mild, chronic appearing increased lung markings are seen without evidence of acute infiltrate, pleural effusion or pneumothorax. The heart size and mediastinal contours are within normal limits. The visualized skeletal structures are unremarkable. IMPRESSION: No active disease. Electronically Signed   By: Virgina Norfolk M.D.   On: 06/30/2019 17:27    Anti-infectives: Anti-infectives (From admission,  onward)   Start     Dose/Rate Route Frequency Ordered Stop   07/04/19 1800  vancomycin (VANCOREADY) IVPB 500 mg/100 mL  Status:  Discontinued     500 mg 100 mL/hr over 60 Minutes Intravenous Every 24 hours 07/01/2019 1741 07/04/19 1401   07/04/19 1800  ceFEPIme (MAXIPIME) 2 g in sodium chloride 0.9 % 100 mL IVPB     2 g 200 mL/hr over 30 Minutes Intravenous Every 24 hours 07/10/2019 1741     06/21/2019 1730  ceFEPIme (MAXIPIME) 2 g in sodium chloride 0.9 % 100 mL IVPB     2 g 200 mL/hr over 30 Minutes Intravenous  Once 07/10/2019 1726 07/06/2019 1845   07/02/2019 1715  vancomycin (VANCOCIN) IVPB 1000 mg/200 mL premix     1,000 mg 200 mL/hr over 60 Minutes Intravenous  Once 06/26/2019 1712 06/29/2019 1845   06/30/2019 1715  aztreonam (AZACTAM) 2 g in sodium chloride 0.9 % 100 mL IVPB  Status:  Discontinued     2 g 200 mL/hr over 30 Minutes Intravenous  Once 06/17/2019 1714 06/15/2019 1726      Assessment/Plan: Sepsis  Crohns disease with perianal involvement (not on medications) Hx diverting ostomy s/p takedown 2011 Chronic sigmoid colon intramural abscess, obstruction, 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 Does not need emergent surgery today. Udx with > 1000000 pseudomonas. c diff negative. Lactic acid has normalized with resuscitation. Continue broad spectrum antibiotics and resuscitation.  She eventually needs  to have surgery. She refused this last week at [facility] and not sure when I discuss with her today. She does not want me to talk to her son or sister who are listed and wants me to talk to someone else she cannot remember number.  She is very malnourished and we would likely plan  diverting ostomy at most. With prealbumin less than five there is no role for anything more than that. ID - maxipime 3/23>>, vancomycin 3/23>>, wbc normal today, treat uti and we will follow  FEN - IVF, NPO VTE - SCDs, she can have pharm dvt proph Foley - none Follow up - surgery at [facility] if desires or colorectal surgery here  GERD HLD Acute on chronic anemia AKI on CKD Severe malnutrition - albumin 1.2, prealbumin < 5 Rolm Bookbinder 07/05/2019

## 2019-07-05 NOTE — Progress Notes (Signed)
Notified lab that patient is a lab draw d/t TPN going through PICC line. Will discuss with nurse regarding possible PICC exchange. Fran Lowes, RN VAST

## 2019-07-05 NOTE — Progress Notes (Signed)
[facility] Progress Note  Katrina Boyer 69 y.o. 1950/05/09   Subjective: Patient is confused this morning.  When asked if she had any abdominal pain, she responded "I don't know. I don't know what's wrong."  Patient unable to report subjective data.    Per RN, Staci Righter, patient continues to have brown, watery bowel movements with no visible blood or melena.  Objective: Vital signs: Vitals:   07/05/19 0800 07/05/19 0941  BP: (!) 95/58 108/65  Pulse: 82 87  Resp: 14 (!) 21  Temp: 98 F (36.7 C) 98 F (36.7 C)  SpO2: 100% 98%    Physical Exam: Gen: lethargic, thin, confused, no acute distress  HEENT: anicteric sclera, conjunctival pallor CV: RRR Chest: CTA B Abd: diffuse tenderness to light palpation with guarding.  Mild distention.  Normoactive bowel sounds. Ext: no edema  Lab Results: Recent Labs    07/04/19 0250 07/05/19 0353  NA 143 143  K 3.5 3.0*  CL 115* 118*  CO2 15* 17*  GLUCOSE 78 70  BUN 32* 36*  CREATININE 1.77* 1.50*  CALCIUM 7.5* 7.7*  MG 1.9 1.9  PHOS 3.3 2.9   Recent Labs    07/04/19 0250 07/05/19 0353  AST 141* 77*  ALT 57* 46*  ALKPHOS 227* 222*  BILITOT 1.5* 2.0*  PROT 5.8* 5.4*  ALBUMIN 1.1* 1.1*   Recent Labs    07/04/19 0250 07/05/19 0353  WBC 17.5* 9.1  NEUTROABS  --  7.2  HGB 7.0* 6.7*  HCT 24.8* 22.1*  MCV 97.3 90.6  PLT 240 211      Assessment/Plan: Crohn's disease with perianal involvement, refractory to Remicade, not currently on medication.  Persistent diarrhea and malnutrition. C. Diff negative.  Due to concerns for active infection, we do not plan to initiate steroids or biologic therapy. Continue supportive care and TPN for nutrition.   Despite negative FOBT and no frank GI bleeding, patient has persistent anemia. Continue to monitor H&H and tranfuse as needed.  Surgery recommended last week by Dr. Launa Flight [facility]) but patient declined at that time.  Patient likely needs surgical intervention;  surgical team is following.  Once concern for infection resolves, patient should return to [facility] for further management of Crohn's and consideration of biologic therapy.   [facility] will sign off.  Please contact us if we can be of any further assistance during this hospital stay.   Salley Slaughter 07/05/2019, 11:20 AM  Questions please call [phone]

## 2019-07-05 NOTE — Progress Notes (Signed)
PROGRESS NOTE    Katrina Boyer  QZE:[id number] DOB: May 03, 1950 DOA: 06/16/2019 PCP: Seward Carol, MD    Brief Narrative:  Patient was admitted to the hospital working diagnosis acute colitis (contained perforation), complicated by sepsis, endorgan failure acute kidney injury/ hypokalemia and hypomagnesemia.   69 year old female who presented with anemia.  She does have significant past medical history of Crohn's disease (chronic TPN), chronic kidney disease stage III, dyslipidemia and cognitive impairment.  She was transferred from the nursing home due to low hemoglobin.  Patient unable to give any detailed history due to cognitive impairment.  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  Patient was noted to have generalized weakness, lethargy and fever.  Positive abdominal pain and distention.  Upon her initial physical examination she was febrile 103 F, heart rate 122, respirate 31, oxygen saturation 91%, moist mucous membranes, lungs clear to auscultation bilaterally, heart S1-S2 present and rhythmic, abdomen distended, generalized tenderness, no lower extremity edema. Sodium 137, potassium 2.6, chloride 104, bicarb 21, glucose 124, BUN 33, creatinine 1.43, white count 8.5, hemoglobin 7.0, hematocrit 24.3, platelets 315.  SARS COVID-19 was negative.  Urinalysis white count more than 50, 0-5 red cells.   CT of the abdomen with increased long segmental circumferential colonic wall thickening, abundant regional mesenteric inflammation, evidence of contained perforation into the sigmoid mesentery, possible fistula formation of the distal small bowel, complicated with left kidney obstructive uropathy.  Chest radiograph no infiltrates.  EKG 106 bpm, left axis deviation, normal intervals, sinus rhythm, no ST segment T wave changes.  Patient placed on broad spectrum antibiotic  therapy, and supportive IV fluids and IV analgesics. Resumed TPN and consulted surgery and GI.  Patient eventually will need colorectal surgery.   Persistent anemia, required 2 units PRBC transfusion.   Assessment & Plan:   Principal Problem:   Crohn's disease ([place]) Active Problems:   Sepsis ([place])   Hypokalemia   Cognitive communication disorder   Anemia   AKI (acute kidney injury) ([place])   1. Acute Crohn's colitis/ sepsis present on admission/ end organ failure AKI.  Wbc is down to 9,1. Blood cultures continue with no growth. Pain is controlled with analgesics, patient continue to be NPO with ice chips.   Supportive medical therapy with IV fluids, IV antibiotics (cefepime), IV analgesics, antiemetics and antiacids. Continue nutrition per TPN.   Patient will need eventually surgery. For now holding on steroids due to active infection.     2. AKI with hypokalemia/ hypomagnesemia/ ckd stage 3a. Renal function to serum cr at 1,50, with K at 3,0 and Mg at 1,9. Will continue correction of electrolytes per pharmacy protocol/ TPN. Follow up renal function in am.   3. Acute on chronic anemia/ likely acute on chronic blood loss due to IBD. Sp one unit PRBC transfusion. Worsening anemia with Hgb down to 6,7, second unit ordered of PRBC. Follow up on cell count in am, no signs of active bleeding.  4. Dyslipidemia. On statin therapy.    5. Severe calorie protein malnutrition/ stage 2 coccyx pressure ulcer (present on admission). Tolerating well TPN for nutrition. Patient continue to be NPO for now. Continue with local wound care.  6. Anxiety and depression. This am patient very anxious, will resume her psychotropic medications, including bupropion, melatonin, ambien and escitalopram.   DVT prophylaxis: enoxaparin   Code Status:  full Family Communication: I spoke with patient's son at the  bedside, we talked in detail about patient's condition, plan of care and prognosis and all  questions were addressed.  Disposition Plan/ discharge barriers: patient from SNF, continue to need IV therapies and high nursing care, expected to dc to SNF.      Nutrition Status:           Skin Documentation: Pressure Injury 04/12/19 Coccyx Stage 2 -  Partial thickness loss of dermis presenting as a shallow open injury with a red, pink wound bed without slough. Monia Pouch (Active)  04/12/19 0100  Location: Coccyx  Location Orientation:   Staging: Stage 2 -  Partial thickness loss of dermis presenting as a shallow open injury with a red, pink wound bed without slough.  Wound Description (Comments): Monia Pouch  Present on Admission: Yes     Consultants:   GI   Surgery      Antimicrobials:   cefepime    Subjective: Patient is feeling very anxious and tearful this am, asking her condition constantly, no nausea or vomiting, but positive abdominal pain and dyspnea.   Objective: Vitals:   07/05/19 0650 07/05/19 0722 07/05/19 0800 07/05/19 0941  BP: 102/68 (!) 102/54 (!) 95/58 108/65  Pulse: 87 84 82 87  Resp: 19 14 14  (!) 21  Temp: 97.6 F (36.4 C) 98 F (36.7 C) 98 F (36.7 C) 98 F (36.7 C)  TempSrc: Oral Oral Oral Oral  SpO2: 100% 99% 100% 98%  Weight:      Height:        Intake/Output Summary (Last 24 hours) at 07/05/2019 1100 Last data filed at 07/05/2019 0941 Gross per 24 hour  Intake 2397.61 ml  Output 400 ml  Net 1997.61 ml   Filed Weights   06/26/2019 2051 07/04/19 0212  Weight: 46.7 kg 53 kg    Examination:   General: Not in pain or dyspnea, deconditioned and ill looking appearing, not frank pain but positive anxiety.  Neurology: Awake and alert, non focal  E ENT: mild pallor, no icterus, oral mucosa moist Cardiovascular: No JVD. S1-S2 present, rhythmic, no gallops, rubs, or murmurs. No lower extremity edema. Pulmonary: positive breath sounds bilaterally, adequate air movement, no wheezing, rhonchi or rales. Gastrointestinal. Abdomen with no  organomegaly, positive tenderness to palpation, with no rebound or guarding Skin. No rashes Musculoskeletal: no joint deformities     Data Reviewed: I have personally reviewed following labs and imaging studies  CBC: Recent Labs  Lab 07/10/2019 1539 07/04/19 0250 07/05/19 0353  WBC 8.5 17.5* 9.1  NEUTROABS  --   --  7.2  HGB 7.0* 7.0* 6.7*  HCT 24.3* 24.8* 22.1*  MCV 90.7 97.3 90.6  PLT 315 240 130   Basic Metabolic Panel: Recent Labs  Lab 07/11/2019 1539 07/04/19 0250 07/05/19 0353  NA 137 143 143  K 2.6* 3.5 3.0*  CL 104 115* 118*  CO2 21* 15* 17*  GLUCOSE 124* 78 70  BUN 33* 32* 36*  CREATININE 1.43* 1.77* 1.50*  CALCIUM 8.1* 7.5* 7.7*  MG  --  1.9 1.9  PHOS  --  3.3 2.9   GFR: Estimated Creatinine Clearance: 30.9 mL/min (A) (by C-G formula based on SCr of 1.5 mg/dL (H)). Liver Function Tests: Recent Labs  Lab 06/30/2019 1539 07/04/19 0250 07/05/19 0353  AST 42* 141* 77*  ALT 27 57* 46*  ALKPHOS 178* 227* 222*  BILITOT 0.9 1.5* 2.0*  PROT 7.0 5.8* 5.4*  ALBUMIN 1.2* 1.1* 1.1*   No results for input(s): LIPASE, AMYLASE in  the last 168 hours. No results for input(s): AMMONIA in the last 168 hours. Coagulation Profile: Recent Labs  Lab 07/02/2019 1712  INR 1.4*   Cardiac Enzymes: No results for input(s): CKTOTAL, CKMB, CKMBINDEX, TROPONINI in the last 168 hours. BNP (last 3 results) No results for input(s): PROBNP in the last 8760 hours. HbA1C: No results for input(s): HGBA1C in the last 72 hours. CBG: No results for input(s): GLUCAP in the last 168 hours. Lipid Profile: Recent Labs    07/05/19 0353  TRIG 150*   Thyroid Function Tests: No results for input(s): TSH, T4TOTAL, FREET4, T3FREE, THYROIDAB in the last 72 hours. Anemia Panel: No results for input(s): VITAMINB12, FOLATE, FERRITIN, TIBC, IRON, RETICCTPCT in the last 72 hours.    Radiology Studies: I have reviewed all of the imaging during this hospital visit  personally     Scheduled Meds: . sodium chloride   Intravenous Once  . Chlorhexidine Gluconate Cloth  6 each Topical Daily  . sodium chloride flush  10-40 mL Intracatheter Q12H  . sodium chloride flush  10-40 mL Intracatheter Q12H   Continuous Infusions: . sodium chloride    . ceFEPime (MAXIPIME) IV 2 g (07/04/19 1714)  . dextrose 5% lactated ringers 75 mL/hr at 07/05/19 0518  . magnesium sulfate bolus IVPB 2 g (07/05/19 1012)  . potassium chloride 10 mEq (07/05/19 1042)  . TPN CYCLIC-ADULT (ION)       LOS: 2 days        Zuleyma Scharf Gerome Apley, MD

## 2019-07-05 NOTE — Progress Notes (Signed)
Pharmacy Antibiotic Note  Katrina Boyer is a 69 y.o. female admitted on 06/20/2019 with sepsis. Pharmacy consult  for cefepime dosing.  WBC 9.5 ,afebrile, Scr 1.43 > 1.7 down to 1.5 today  (BL 0.8-1.3),  estimated CrCl ~30.9 mL.min  Plan: Adjust Cefepime dose to 2g IV Q12h F/u clinical progress, c/s, de-escalation, and LOT  Height: 5' 4"  (162.6 cm) Weight: 116 lb 13.5 oz (53 kg) IBW/kg (Calculated) : 54.7  Temp (24hrs), Avg:97.8 F (36.6 C), Min:97.5 F (36.4 C), Max:98.1 F (36.7 C)  Recent Labs  Lab 07/10/2019 1539 06/20/2019 1727 07/04/19 0250 07/04/19 1151 07/05/19 0353  WBC 8.5  --  17.5*  --  9.1  CREATININE 1.43*  --  1.77*  --  1.50*  LATICACIDVEN 3.6* 3.7*  --  1.9  --     Estimated Creatinine Clearance: 30.9 mL/min (A) (by C-G formula based on SCr of 1.5 mg/dL (H)).    Allergies  Allergen Reactions  . Piperacillin-Tazobactam In Dex     Thrombocytopenia   . Metronidazole     neuropathy  . Gabapentin     Unknown  . Mercaptopurine Nausea Only  . Ciprofloxacin Rash    Antimicrobials this admission: 3/23 vanc >> 3/24 3/23 cefepime >>   Dose adjustments this admission: N/A  Microbiology results: 3/23 BCx:  ngtd x2 days 3/23 UCx: pseudomonas aeruginosa , sens pending 3/24 c diff PCR:  neg 3/23 Covid: neg;  influenza neg   Thank you for allowing pharmacy to be a part of this patient's care.  Nicole Cella, RPh Clinical Pharmacist Please check AMION for all [place] phone numbers After 10:00 PM, call [place] [phone] 07/05/2019 11:05 AM

## 2019-07-05 NOTE — Progress Notes (Signed)
Spoke with Son, [place]. He prefers [place] to speak with his Lovie Macadamia for any decisions that need to be made. Once Mariann Laster has the information she will then speak with Son, Velna Hatchet. They both will make a decision together.

## 2019-07-05 NOTE — Progress Notes (Signed)
Initial Nutrition Assessment   RD working remotely.  DOCUMENTATION CODES:   Not applicable  INTERVENTION:   TPN per Pharmacy--- See new re-estimated nutrition needs below.  NUTRITION DIAGNOSIS:   Inadequate oral intake related to inability to eat as evidenced by NPO status.  GOAL:   Patient will meet greater than or equal to 90% of their needs  MONITOR:   Skin, Weight trends, Labs, I & O's, Other (Comment)(TPN)  REASON FOR ASSESSMENT:   Consult New TPN/TNA  ASSESSMENT:   69 y.o. female with medical history significant of Crohn's disease with perianal involvement-on TPN, CKD stage III, GERD, hyperlipidemia, insomnia brought by EMS from nursing home with complaint of low hemoglobin. Admitted to the hospital working diagnosis acute colitis, complicated by sepsis, endorgan failure acute kidney injury. Per MD, CTdemonstrates progression of inflammatory bowel disease with contained perforation into the mesentery, possible small bowel fistula/ involvement of uterine/left adnexa and renal collecting system- this appears to be longstanding.  Pt confused per MD. RD unable to obtain pt nutrition history at this time. No emergent surgery today, however per MD pt eventually needs colorectal surgery. Noted pt was on a regular diet along with TPN prior to current admission.  Per Pharmacy, pt TPN regimen prior to admission: TPN Formula (from [facility] [place]): Infuse 1400 mL over 16 hours with 1 hour taper up and down:  65g AA, 240g Dextrose with 46m trace elements, 215 mEq Na, 30 mEq K, 6 mmol Phos, no Calcium, and 10 mEq Mag, and CBJ:YNWGNFA2:1. 50g Lipids four times per week. Providing 1576 kcals on lipid days and 1076 kcal on non-lipid days (average 1362 kcals/day).  Pharmacy plans to restart cyclic TPN at 12130PM : Infuse 1440 mL over 18 hours, rate of 42 ml/hr x 1 hour, then811mhr x 16 hrs, then 42 ml/hr x 1 hour, providing 84g AA, 259g CHO and 40g ILE for a total of  1617 kcals.   Unable to complete Nutrition-Focused physical exam at this time.   Labs and medications reviewed.   Diet Order:   Diet Order            Diet NPO time specified Except for: Ice Chips, Sips with Meds  Diet effective now              EDUCATION NEEDS:   Not appropriate for education at this time  Skin:  Skin Assessment: Reviewed RN Assessment  Last BM:  3/25  Height:   Ht Readings from Last 1 Encounters:  07/04/19 5' 4"  (1.626 m)    Weight:   Wt Readings from Last 1 Encounters:  07/04/19 53 kg    BMI:  Body mass index is 20.06 kg/m.  Estimated Nutritional Needs:   Kcal:  178657-8469Protein:  85-100 grams  Fluid:  >/= 1.8 L/day    StCorrin ParkerMS, RD, LDN RD pager number/after hours weekend pager number on Amion.

## 2019-07-06 ENCOUNTER — Inpatient Hospital Stay (HOSPITAL_COMMUNITY): Payer: Medicare Other

## 2019-07-06 LAB — TYPE AND SCREEN
ABO/RH(D): O POS
Antibody Screen: NEGATIVE
Unit division: 0
Unit division: 0

## 2019-07-06 LAB — BPAM RBC
Blood Product Expiration Date: 202104252359
Blood Product Expiration Date: 202104272359
ISSUE DATE / TIME: 202103231944
ISSUE DATE / TIME: 202103250701
Unit Type and Rh: 5100
Unit Type and Rh: 5100

## 2019-07-06 LAB — HEMOGLOBIN A1C
Hgb A1c MFr Bld: 5.9 % — ABNORMAL HIGH (ref 4.8–5.6)
Mean Plasma Glucose: 122.63 mg/dL

## 2019-07-06 LAB — CBC WITH DIFFERENTIAL/PLATELET
Abs Immature Granulocytes: 0 10*3/uL (ref 0.00–0.07)
Basophils Absolute: 0 10*3/uL (ref 0.0–0.1)
Basophils Relative: 0 %
Eosinophils Absolute: 0 10*3/uL (ref 0.0–0.5)
Eosinophils Relative: 0 %
HCT: 30.4 % — ABNORMAL LOW (ref 36.0–46.0)
Hemoglobin: 9.4 g/dL — ABNORMAL LOW (ref 12.0–15.0)
Lymphocytes Relative: 5 %
Lymphs Abs: 0.4 10*3/uL — ABNORMAL LOW (ref 0.7–4.0)
MCH: 28.1 pg (ref 26.0–34.0)
MCHC: 30.9 g/dL (ref 30.0–36.0)
MCV: 90.7 fL (ref 80.0–100.0)
Monocytes Absolute: 0 10*3/uL — ABNORMAL LOW (ref 0.1–1.0)
Monocytes Relative: 0 %
Neutro Abs: 7.7 10*3/uL (ref 1.7–7.7)
Neutrophils Relative %: 95 %
Platelets: 166 10*3/uL (ref 150–400)
RBC: 3.35 MIL/uL — ABNORMAL LOW (ref 3.87–5.11)
RDW: 16.7 % — ABNORMAL HIGH (ref 11.5–15.5)
WBC: 8.1 10*3/uL (ref 4.0–10.5)
nRBC: 0 % (ref 0.0–0.2)
nRBC: 0 /100 WBC

## 2019-07-06 LAB — GLUCOSE, CAPILLARY
Glucose-Capillary: 140 mg/dL — ABNORMAL HIGH (ref 70–99)
Glucose-Capillary: 146 mg/dL — ABNORMAL HIGH (ref 70–99)
Glucose-Capillary: 169 mg/dL — ABNORMAL HIGH (ref 70–99)
Glucose-Capillary: 204 mg/dL — ABNORMAL HIGH (ref 70–99)
Glucose-Capillary: 208 mg/dL — ABNORMAL HIGH (ref 70–99)

## 2019-07-06 MED ORDER — DEXTROSE 10 % IV SOLN: INTRAVENOUS | Status: DC

## 2019-07-06 MED ORDER — ZINC OXIDE 40 % EX OINT
TOPICAL_OINTMENT | Freq: Every day | CUTANEOUS | Status: DC
  Administered 2019-07-07 – 2019-07-08 (×6): 1 via TOPICAL
  Filled 2019-07-06 (×2): qty 57

## 2019-07-06 MED ORDER — SODIUM CHLORIDE 0.9 % IV SOLN
INTRAVENOUS | Status: DC | PRN
  Administered 2019-07-06: 250 mL via INTRAVENOUS

## 2019-07-06 MED ORDER — SODIUM PHOSPHATES 45 MMOLE/15ML IV SOLN
20.0000 mmol | Freq: Once | INTRAVENOUS | Status: DC
  Filled 2019-07-06: qty 6.67

## 2019-07-06 MED ORDER — SODIUM CHLORIDE 0.9 % IV SOLN
2.0000 g | Freq: Two times a day (BID) | INTRAVENOUS | Status: DC
  Administered 2019-07-06 – 2019-07-08 (×4): 2 g via INTRAVENOUS
  Filled 2019-07-06 (×4): qty 2

## 2019-07-06 MED ORDER — SODIUM CHLORIDE 0.9 % IV SOLN: 1.0000 g | INTRAVENOUS | Status: DC

## 2019-07-06 MED ORDER — INSULIN ASPART 100 UNIT/ML ~~LOC~~ SOLN
0.0000 [IU] | Freq: Four times a day (QID) | SUBCUTANEOUS | Status: DC
  Administered 2019-07-06 (×2): 1 [IU] via SUBCUTANEOUS
  Administered 2019-07-07: 3 [IU] via SUBCUTANEOUS
  Administered 2019-07-07: 1 [IU] via SUBCUTANEOUS
  Administered 2019-07-07: 2 [IU] via SUBCUTANEOUS
  Administered 2019-07-07: 3 [IU] via SUBCUTANEOUS
  Administered 2019-07-08: 2 [IU] via SUBCUTANEOUS

## 2019-07-06 MED ORDER — TRAVASOL 10 % IV SOLN
INTRAVENOUS | Status: AC
  Filled 2019-07-06: qty 835.2

## 2019-07-06 NOTE — Consult Note (Signed)
WOC Nurse Consult Note: Patient receiving care in [facility] [phone]. 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 Pressure Injury POA: Yes/No/NA Measurement: Wound bed: red, excoriated Drainage (amount, consistency, odor)  Periwound: intact Dressing procedure/placement/frequency:  I have modified the Destin order from tid to 6 times daily and prn perineal cleansing. Monitor the wound area(s) for worsening of condition such as: Signs/symptoms of infection,  Increase in size,  Development of or worsening of odor, Development of pain, or increased pain at the affected locations.  Notify the medical team if any of these develop.  Thank you for the consult.  Discussed plan of care with the patient and bedside nurse.  [place] nurse will not follow at this time.  Please re-consult the [place] team if needed.  Val Riles, RN, MSN, CWOCN, CNS-BC, pager [phone]

## 2019-07-06 NOTE — Progress Notes (Addendum)
CCS daily progress note  Subjective/Chief Complaint: Confused this morning - oriented only to self. I told her she was at [facility] and she responded with "im not at [facility]". Wellbutrin/Lexapro resumed yesterday. Getting xanax PRN for agitation.  Currently undergoing patient care- RN reports multilple BMs and raw, open, excoriated peri-anal wounds related to frequent liquid BMs. He just applied Destin so I did not evaluate these wounds myself.  1 u pRBC yesterday at 0940 for hgb 6.7/hct 22.1, vitals stable.   Objective: Vital signs in last 24 hours: Temp:  [97.4 F (36.3 C)-98.1 F (36.7 C)] 97.8 F (36.6 C) (03/26 0452) Pulse Rate:  [80-92] 92 (03/26 0452) Resp:  [15-23] 23 (03/26 0452) BP: (92-120)/(61-71) 119/71 (03/26 0452) SpO2:  [98 %-100 %] 100 % (03/26 0452) Last BM Date: 07/05/19  Intake/Output from previous day: 03/25 0701 - 03/26 0700 In: 4373.6 [P.O.:720; I.V.:2542.4; Blood:365.5; IV Piggyback:745.6] Out: 1250 [Urine:1250] Intake/Output this shift: No intake/output data recorded. Gen: alert, confused, moaning, appears malnourished with diffuse muscle wasting GI: moderate distention with tympany, mild global tenderness without peritonitis  Lab Results:  Recent Labs    07/04/19 0250 07/05/19 0353  WBC 17.5* 9.1  HGB 7.0* 6.7*  HCT 24.8* 22.1*  PLT 240 211   BMET Recent Labs    07/05/19 0353 07/05/19 2013  NA 143 141  K 3.0* 3.9  CL 118* 116*  CO2 17* 16*  GLUCOSE 70 305*  BUN 36* 31*  CREATININE 1.50* 1.48*  CALCIUM 7.7* 7.6*   PT/INR Recent Labs    06/22/2019 1712  LABPROT 16.9*  INR 1.4*   ABG No results for input(s): PHART, HCO3 in the last 72 hours.  Invalid input(s): PCO2, PO2  Studies/Results: No results found.  Anti-infectives: Anti-infectives (From admission, onward)   Start     Dose/Rate Route Frequency Ordered Stop   07/05/19 1200  ceFEPIme (MAXIPIME) 2 g in sodium chloride 0.9 % 100 mL IVPB     2 g 200  mL/hr over 30 Minutes Intravenous Every 12 hours 07/05/19 1105     07/04/19 1800  vancomycin (VANCOREADY) IVPB 500 mg/100 mL  Status:  Discontinued     500 mg 100 mL/hr over 60 Minutes Intravenous Every 24 hours 06/22/2019 1741 07/04/19 1401   07/04/19 1800  ceFEPIme (MAXIPIME) 2 g in sodium chloride 0.9 % 100 mL IVPB  Status:  Discontinued     2 g 200 mL/hr over 30 Minutes Intravenous Every 24 hours 07/04/2019 1741 07/05/19 1105   06/29/2019 1730  ceFEPIme (MAXIPIME) 2 g in sodium chloride 0.9 % 100 mL IVPB     2 g 200 mL/hr over 30 Minutes Intravenous  Once 06/28/2019 1726 06/22/2019 1845   06/18/2019 1715  vancomycin (VANCOCIN) IVPB 1000 mg/200 mL premix     1,000 mg 200 mL/hr over 60 Minutes Intravenous  Once 06/12/2019 1712 06/22/2019 1845   06/12/2019 1715  aztreonam (AZACTAM) 2 g in sodium chloride 0.9 % 100 mL IVPB  Status:  Discontinued     2 g 200 mL/hr over 30 Minutes Intravenous  Once 06/26/2019 1714 07/10/2019 1726      Assessment/Plan: Sepsis  Acute on chronic normocytic anemia/ suspect acute on chronic blood loss due to IBD - 1u PRBC 3/25 consider post-transfusion CBC   Crohns disease with perianal involvement (not on medications) - GI following, recommend consideration of biologic therapy at [facility] once acute infection resolved. 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 Does not need emergent surgery today. Udx with > 1000000 pseudomonas. c diff negative. Lactic acid has normalized with resuscitation. Continue antibiotics and resuscitation.  She eventually needs to have surgery. She  refused this last week at [facility] and not sure when I discuss with her today. She did not want our team to talk to her son or sister who are listed - per RN, yesterday it was established that the patients daughter , Mariann Laster, wound be the contact person for the patient.  She is very malnourished and we would likely plan diverting ostomy at most. With prealbumin less than five there is no role for anything more than that.  ID -vancomycin 3/23,  maxipime 3/23>> (day#3), wbc normal, urine sensitivities are back and sensitive to cipro. Ok to narrow abx to cipro from surgical standpoint but please also add flagyl for more intra-abdominal anaerobic coverage.  FEN - IVF, NPO, TPN VTE - SCDs, lovenox Foley - none  Follow up - surgery at [facility] if desires or colorectal surgery here  GERD HLD Acute on chronic anemia AKI on CKD Severe malnutrition - albumin 1.2, prealbumin < 5 Delirium   Jill Alexanders  CCS surgery 07/06/2019

## 2019-07-06 NOTE — Progress Notes (Addendum)
PHARMACY - TOTAL PARENTERAL NUTRITION CONSULT NOTE   Indication: Chronic; intolerance to enteral feeding  Patient Measurements: Height: 5' 4"  (162.6 cm) Weight: 116 lb 13.5 oz (53 kg) IBW/kg (Calculated) : 54.7 TPN AdjBW (KG): 53 Body mass index is 20.06 kg/m.  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 She was admitted from [place] facility with concern for sepsis. CT on 3/23 shows contained perforation in the sigmoid mesentery and possible fistula formation to distal small bowel.  No need for emergent surgery per Surgery team but may perform diverting ostomy. She is followed by [facility] and was to have surgery last week but it was post-poned by patient. Pharmacy consulted to continue TPN management.   Glucose / Insulin: CBG 70 - 305s (NPO on D5 with LR at 75 ml/hr) Electrolytes: K 3.9, Mg 2.3, Phos 1.9 - replaced, CO2 low 16, Cl elevated at 116, CoCa 10.  Renal: SCr down to 1.48, BUN 31.  LFTs / TGs: AST/ALT elevated on admission - now trending down (77/46), Tbili up 2, TG 150. No sign of jaundice per RN.  Prealbumin / albumin: Prealbumin <5 / Albumin 1.1 Intake / Output; MIVF: UOP 1 mL/kg/hr + some unmeasured occurrences. Stools (x1 + per RN).  GI Imaging: none since resuming TPN Surgeries / Procedures: none since resuming TPN  Central access: PICC (placed 04/18/19) TPN start date: Restart 3/25   Nutritional Goals (per RD recommendation 3/25): Kcal:  1750-1950 Protein:  85-100 grams Fluid:  >/= 1.8 L/day  Current Nutrition:  NPO with ice chips only TPN - Chronic, has only missed one day  Chronic Concentrated TPN Formula (from [facility] [place]): Infuse 1400 mL over 16 hours with 1 hour taper up and down:  65g AA, 240g Dextrose with 7m trace elements, 215 mEq Na, 30 mEq K, 6  mmol Phos, no Calcium, and 10 mEq Mag, and CWU:XLKGMWN2:1. 50g Lipids four times per week. Providing 1576 kcals on lipid days and 1076 kcal on non-lipid days (average 1362 kcals/day). *Per ASanfordPharmacist, patient has been on TPN for months with little improvement in weight. Patient had been sensitive to electrolytes but lately has been requiring more. Patient was also on a regular diet in addition to TPN up until admission.   **TPN became unhooked from patient overnight, therefore started D10W @ 42 ml/hr until 1800 tonight when TPN can be rehung  Plan:  Restart cyclic TPN at 10272PM : Infuse 1440 mL over 16 hours, rate of 42 ml/hr x 1 hour, then 96 ml/hr x 14 hrs, then 42 ml/hr x 1 hour, providing 84g AA, 259g CHO and 0g ILE for a total of 1215  , meeting ~100% of patient protein needs and 70% of calorie needs Will plan to transition to 16 hrs if tolerates. Electrolytes in TPN: 120 mEq/L Na (decrease), 40 mEq/L K (increase), no Calcium, 8 mEq/L Mg, and 5 mmol/L Phos, adjust Cl:Ac at 1:2 Add MVI on MWF only due to nProducer, television/film/video  Add trace elements daily to TPN. Will start sensitive SSI q6hr. Decrease D5LR to 35 ml/hr Stop D10W when TPN hung tonight Monitor for any signs of jaundice with elevated bilirubin - if jaundice, will reduce trace elements to half.   TPN labs Mon/Thurs Will recheck BMET/Mg/Phos in am.   CAlanda Slim PharmD, FALPine Surgicenter LLC Dba ALPine Surgery CenterClinical Pharmacist Please see  AMION for all Pharmacists' Contact Phone Numbers 07/06/2019, 8:43 AM

## 2019-07-06 NOTE — Progress Notes (Signed)
PROGRESS NOTE    Katrina Boyer  MIW:[id number] DOB: 1951-01-19 DOA: 06/29/2019 PCP: Seward Carol, MD    Brief Narrative:  Patient was admitted to the hospital working diagnosis acute colitis (contained perforation), complicated by sepsis, endorgan failure acute kidney injury/ hypokalemia and hypomagnesemia.  69 year old female who presented with anemia. She does have significant past medical history of Crohn's disease (chronic TPN), chronic kidney disease stage III, dyslipidemia and cognitive impairment. She was transferred from the nursing home due to low hemoglobin. Patient unable to give any detailed history due to cognitive impairment. 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 Patient was noted to have generalized weakness, lethargy and fever. Positive abdominal pain and distention. Upon her initial physical examination she was febrile 103 F, heart rate 122, respirate 31, oxygen saturation 91%, moist mucous membranes, lungs clear to auscultation bilaterally, heart S1-S2 present and rhythmic,abdomen distended, generalized tenderness, no lower extremity edema. Sodium 137, potassium 2.6, chloride 104, bicarb 21, glucose 124, BUN 33, creatinine 1.43, white count 8.5, hemoglobin 7.0, hematocrit 24.3, platelets 315.SARS COVID-19 was negative.Urinalysis white count more than 50, 0-5 red cells.  CT of the abdomen with increased longsegmental circumferential colonic wall thickening, abundant regional mesenteric inflammation, evidence of contained perforation into the sigmoid mesentery, possible fistula formation of the distal small bowel,complicated with left kidney obstructive uropathy. Chest radiograph no infiltrates.EKG 106 bpm, left axis deviation, normal intervals, sinus rhythm, no ST segment T wave changes.  Patient placed on broad spectrum antibiotic  therapy, and supportive IV fluids and IV analgesics. Resumed TPN and consulted surgery and GI.  Patient eventually will need colorectal surgery.   Persistent anemia, required 2 units PRBC transfusion.   Urine analysis with pyuria, urine culture positive for Pseudomonas, sensitive for cephalosporin.     Assessment & Plan:   Principal Problem:   Crohn's disease ([place]) Active Problems:   Sepsis ([place])   Hypokalemia   Cognitive communication disorder   Anemia   AKI (acute kidney injury) ([place])   1. Acute Crohn's colitis/ sepsis present on admission/ end organ failure AKI.  Wbc continue trending down to 8,1. Blood cultures continue with no growth, but urine culture positive for pesudomonas, sensitive to cephalosporins. Abdominal film with improvement in small bowel distention.   Will continue pain control with hydromorphone, IV fluids, IV antibiotics (cefepime), IV analgesics, antiemetics and antiacids. Continue nutrition per TPN. Will do a trial of clear liquids.    2. AKI with hypokalemia/ hypomagnesemia/ ckd stage 3a. Will follow up renal function in am, patient is getting TPN. Will advance diet to clears.  3. Acute on chronic anemia/ likely acute on chronic blood loss due to IBD. Sp 2 unit PRBC transfusion. Follow up hgb up to 9,4.   4. Dyslipidemia. Continue with statin therapy.   5. Severe calorie protein malnutrition/ stage 2 coccyx pressure ulcer (present on admission). Continue with TPN for nutrition. Will advance to clears.   6. Anxiety and depression. Continue with bupropion, melatonin, ambien and escitalopram. As needed alprazolam with good toleration.   DVT prophylaxis:enoxaparin Code Status:full Family Communication:I spoke with patient'ssisterat the bedside, we talked in detail about patient's condition, plan of care and prognosis and all questions were addressed. Disposition Plan/ discharge barriers:patient from SNF, continue to need IV therapies  and high nursing care, expected to dc to SNF.    Nutrition Status: Nutrition Problem: Inadequate oral intake Etiology: inability to eat Signs/Symptoms: NPO status Interventions: TPN  Consultants:   Surgery   GI   Procedures:     Antimicrobials:       Subjective: Patient has been confused and agitated, continue to have abdominal pain, no nausea or vomiting, no chest pain. Complains of sever abdominal pain.   Objective: Vitals:   07/05/19 1304 07/05/19 2000 07/06/19 0032 07/06/19 0452  BP: 92/61 110/63 120/66 119/71  Pulse: 83 80 90 92  Resp: 15 17 (!) 22 (!) 23  Temp: (!) 97.4 F (36.3 C) 98.1 F (36.7 C) 97.8 F (36.6 C) 97.8 F (36.6 C)  TempSrc: Oral Oral Oral Oral  SpO2: 98% 98% 98% 100%  Weight:      Height:        Intake/Output Summary (Last 24 hours) at 07/06/2019 1013 Last data filed at 07/06/2019 0814 Gross per 24 hour  Intake 4008.05 ml  Output 2150 ml  Net 1858.05 ml   Filed Weights   07/02/2019 2051 07/04/19 0212  Weight: 46.7 kg 53 kg    Examination:   General: Not in pain or dyspnea, deconditioned  Neurology: Awake and alert, she is confused and disorientated E ENT: positive  pallor, no icterus, oral mucosa moist Cardiovascular: No JVD. S1-S2 present, rhythmic, no gallops, rubs, or murmurs. No lower extremity edema. Pulmonary: positive breath sounds bilaterally, adequate air movement, no wheezing, rhonchi or rales. Gastrointestinal. Abdomen mild distended, tender to palpation, with no organomegaly, no rebound but mild guarding. Musculoskeletal: no joint deformities     Data Reviewed: I have personally reviewed following labs and imaging studies  CBC: Recent Labs  Lab 06/20/2019 1539 07/04/19 0250 07/05/19 0353  WBC 8.5 17.5* 9.1  NEUTROABS  --   --  7.2  HGB 7.0* 7.0* 6.7*  HCT 24.3* 24.8* 22.1*  MCV 90.7 97.3 90.6  PLT 315 240 481   Basic Metabolic Panel: Recent Labs  Lab 06/30/2019 1539 07/04/19 0250 07/05/19 0353  07/05/19 2013  NA 137 143 143 141  K 2.6* 3.5 3.0* 3.9  CL 104 115* 118* 116*  CO2 21* 15* 17* 16*  GLUCOSE 124* 78 70 305*  BUN 33* 32* 36* 31*  CREATININE 1.43* 1.77* 1.50* 1.48*  CALCIUM 8.1* 7.5* 7.7* 7.6*  MG  --  1.9 1.9 2.3  PHOS  --  3.3 2.9 1.9*   GFR: Estimated Creatinine Clearance: 31.3 mL/min (A) (by C-G formula based on SCr of 1.48 mg/dL (H)). Liver Function Tests: Recent Labs  Lab 06/15/2019 1539 07/04/19 0250 07/05/19 0353  AST 42* 141* 77*  ALT 27 57* 46*  ALKPHOS 178* 227* 222*  BILITOT 0.9 1.5* 2.0*  PROT 7.0 5.8* 5.4*  ALBUMIN 1.2* 1.1* 1.1*   No results for input(s): LIPASE, AMYLASE in the last 168 hours. No results for input(s): AMMONIA in the last 168 hours. Coagulation Profile: Recent Labs  Lab 06/22/2019 1712  INR 1.4*   Cardiac Enzymes: No results for input(s): CKTOTAL, CKMB, CKMBINDEX, TROPONINI in the last 168 hours. BNP (last 3 results) No results for input(s): PROBNP in the last 8760 hours. HbA1C: No results for input(s): HGBA1C in the last 72 hours. CBG: Recent Labs  Lab 07/06/19 0847  GLUCAP 169*   Lipid Profile: Recent Labs    07/05/19 0353  TRIG 150*   Thyroid Function Tests: No results for input(s): TSH, T4TOTAL, FREET4, T3FREE, THYROIDAB in the last 72 hours. Anemia Panel: No results for input(s): VITAMINB12, FOLATE, FERRITIN, TIBC, IRON, RETICCTPCT in the last 72 hours.    Radiology Studies: I have reviewed  all of the imaging during this hospital visit personally     Scheduled Meds: . sodium chloride   Intravenous Once  . buPROPion  300 mg Oral Daily  . Chlorhexidine Gluconate Cloth  6 each Topical Daily  . cholecalciferol  1,000 Units Oral Daily  . escitalopram  5 mg Oral Daily  . feeding supplement (PRO-STAT SUGAR FREE 64)  30 mL Oral Daily  . insulin aspart  0-9 Units Subcutaneous Q6H  . melatonin  3 mg Oral QHS  . pantoprazole  40 mg Oral Daily  . pravastatin  20 mg Oral Daily  . saccharomyces boulardii   250 mg Oral BID  . sodium chloride flush  10-40 mL Intracatheter Q12H  . sodium chloride flush  10-40 mL Intracatheter Q12H  . zinc sulfate  220 mg Oral Daily   Continuous Infusions: . sodium chloride    . sodium chloride Stopped (07/06/19 0028)  . ceFEPime (MAXIPIME) IV 2 g (07/06/19 0837)  . dextrose 42 mL/hr at 07/06/19 0832  . dextrose 5% lactated ringers 35 mL/hr at 07/06/19 0844  . TPN CYCLIC-ADULT (ION) Stopped (07/06/19 0800)  . TPN CYCLIC-ADULT (ION)       LOS: 3 days        Farhad Burleson Gerome Apley, MD

## 2019-07-06 NOTE — Progress Notes (Signed)
Found TPN disconnected from PICC line, blood in the extension tubing observed. RN made aware, unsure how long the line has been disconnected. TPN stopped, 200 cc's left in the bag. PICC flushed, site unremarkable, flushed well with blood return.RN contacted pharmacy.

## 2019-07-07 ENCOUNTER — Inpatient Hospital Stay (HOSPITAL_COMMUNITY): Payer: Medicare Other

## 2019-07-07 LAB — CBC WITH DIFFERENTIAL/PLATELET
Abs Immature Granulocytes: 1.84 10*3/uL — ABNORMAL HIGH (ref 0.00–0.07)
Basophils Absolute: 0.1 10*3/uL (ref 0.0–0.1)
Basophils Relative: 1 %
Eosinophils Absolute: 0.2 10*3/uL (ref 0.0–0.5)
Eosinophils Relative: 1 %
HCT: 29.1 % — ABNORMAL LOW (ref 36.0–46.0)
Hemoglobin: 8.9 g/dL — ABNORMAL LOW (ref 12.0–15.0)
Immature Granulocytes: 8 %
Lymphocytes Relative: 15 %
Lymphs Abs: 3.3 10*3/uL (ref 0.7–4.0)
MCH: 27.5 pg (ref 26.0–34.0)
MCHC: 30.6 g/dL (ref 30.0–36.0)
MCV: 89.8 fL (ref 80.0–100.0)
Monocytes Absolute: 0.8 10*3/uL (ref 0.1–1.0)
Monocytes Relative: 4 %
Neutro Abs: 15.7 10*3/uL — ABNORMAL HIGH (ref 1.7–7.7)
Neutrophils Relative %: 71 %
Platelets: 146 10*3/uL — ABNORMAL LOW (ref 150–400)
RBC: 3.24 MIL/uL — ABNORMAL LOW (ref 3.87–5.11)
RDW: 17 % — ABNORMAL HIGH (ref 11.5–15.5)
WBC: 22 10*3/uL — ABNORMAL HIGH (ref 4.0–10.5)
nRBC: 0.1 % (ref 0.0–0.2)

## 2019-07-07 LAB — MAGNESIUM: Magnesium: 1.7 mg/dL (ref 1.7–2.4)

## 2019-07-07 LAB — BLOOD GAS, ARTERIAL
Acid-base deficit: 4 mmol/L — ABNORMAL HIGH (ref 0.0–2.0)
Bicarbonate: 20.1 mmol/L (ref 20.0–28.0)
Drawn by: 12971
FIO2: 36
O2 Saturation: 95.4 %
Patient temperature: 37.8
pCO2 arterial: 34.9 mmHg (ref 32.0–48.0)
pH, Arterial: 7.383 (ref 7.350–7.450)
pO2, Arterial: 78.7 mmHg — ABNORMAL LOW (ref 83.0–108.0)

## 2019-07-07 LAB — BASIC METABOLIC PANEL
Anion gap: 8 (ref 5–15)
BUN: 31 mg/dL — ABNORMAL HIGH (ref 8–23)
CO2: 20 mmol/L — ABNORMAL LOW (ref 22–32)
Calcium: 7.5 mg/dL — ABNORMAL LOW (ref 8.9–10.3)
Chloride: 109 mmol/L (ref 98–111)
Creatinine, Ser: 1.2 mg/dL — ABNORMAL HIGH (ref 0.44–1.00)
GFR calc Af Amer: 55 mL/min — ABNORMAL LOW (ref >60)
GFR calc non Af Amer: 47 mL/min — ABNORMAL LOW (ref >60)
Glucose, Bld: 175 mg/dL — ABNORMAL HIGH (ref 70–99)
Potassium: 3 mmol/L — ABNORMAL LOW (ref 3.5–5.1)
Sodium: 137 mmol/L (ref 135–145)

## 2019-07-07 LAB — MRSA PCR SCREENING: MRSA by PCR: POSITIVE — AB

## 2019-07-07 LAB — GLUCOSE, CAPILLARY
Glucose-Capillary: 196 mg/dL — ABNORMAL HIGH (ref 70–99)
Glucose-Capillary: 130 mg/dL — ABNORMAL HIGH (ref 70–99)
Glucose-Capillary: 73 mg/dL (ref 70–99)
Glucose-Capillary: 204 mg/dL — ABNORMAL HIGH (ref 70–99)

## 2019-07-07 LAB — PHOSPHORUS: Phosphorus: 1 mg/dL — CL (ref 2.5–4.6)

## 2019-07-07 MED ORDER — TRAVASOL 10 % IV SOLN
INTRAVENOUS | Status: AC
  Filled 2019-07-07: qty 840

## 2019-07-07 MED ORDER — VANCOMYCIN HCL IN DEXTROSE 1-5 GM/200ML-% IV SOLN
1000.0000 mg | INTRAVENOUS | Status: DC
  Administered 2019-07-07: 1000 mg via INTRAVENOUS
  Filled 2019-07-07: qty 200

## 2019-07-07 MED ORDER — SODIUM CHLORIDE 0.9 % IV BOLUS
1000.0000 mL | Freq: Once | INTRAVENOUS | Status: AC
  Administered 2019-07-07: 1000 mL via INTRAVENOUS

## 2019-07-07 MED ORDER — MAGNESIUM SULFATE 2 GM/50ML IV SOLN
2.0000 g | Freq: Once | INTRAVENOUS | Status: AC
  Administered 2019-07-07: 2 g via INTRAVENOUS
  Filled 2019-07-07: qty 50

## 2019-07-07 MED ORDER — POTASSIUM PHOSPHATES 15 MMOLE/5ML IV SOLN
30.0000 mmol | Freq: Once | INTRAVENOUS | Status: AC
  Administered 2019-07-07: 30 mmol via INTRAVENOUS
  Filled 2019-07-07: qty 10

## 2019-07-07 MED ORDER — CHLORHEXIDINE GLUCONATE CLOTH 2 % EX PADS
6.0000 | MEDICATED_PAD | Freq: Every day | CUTANEOUS | Status: DC
  Administered 2019-07-08: 6 via TOPICAL

## 2019-07-07 MED ORDER — HYDROMORPHONE HCL 1 MG/ML IJ SOLN
0.5000 mg | INTRAMUSCULAR | Status: DC | PRN
  Administered 2019-07-07 – 2019-07-08 (×4): 0.5 mg via INTRAVENOUS
  Filled 2019-07-07 (×4): qty 0.5

## 2019-07-07 MED ORDER — MUPIROCIN 2 % EX OINT
1.0000 "application " | TOPICAL_OINTMENT | Freq: Two times a day (BID) | CUTANEOUS | Status: DC
  Administered 2019-07-07 – 2019-07-08 (×2): 1 via NASAL
  Filled 2019-07-07: qty 22

## 2019-07-07 MED ORDER — POTASSIUM CHLORIDE 10 MEQ/50ML IV SOLN
10.0000 meq | INTRAVENOUS | Status: AC
  Administered 2019-07-07 (×4): 10 meq via INTRAVENOUS
  Filled 2019-07-07 (×4): qty 50

## 2019-07-07 NOTE — Progress Notes (Addendum)
PROGRESS NOTE    Katrina Boyer  IOM:[id number] DOB: [birth year] DOA: 06/19/2019 PCP: Seward Carol, MD    Brief Narrative:  Patient was admitted to the hospital working diagnosis acute colitis(contained perforation), complicated by sepsis, endorgan failure acute kidney injury/ hypokalemia and hypomagnesemia.Pseudomonas urine infection.   69 year old female who presented with anemia. She does have significant past medical history of Crohn's disease (chronic TPN), chronic kidney disease stage III, dyslipidemia and cognitive impairment. She was transferred from the nursing home due to low hemoglobin. Patient unable to give any detailed history due to cognitive impairment. 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 Patient was noted to have generalized weakness, lethargy and fever. Positive abdominal pain and distention. Upon her initial physical examination she was febrile 103 F, heart rate 122, respirate 31, oxygen saturation 91%, moist mucous membranes, lungs clear to auscultation bilaterally, heart S1-S2 present and rhythmic,abdomen distended, generalized tenderness, no lower extremity edema. Sodium 137, potassium 2.6, chloride 104, bicarb 21, glucose 124, BUN 33, creatinine 1.43, white count 8.5, hemoglobin 7.0, hematocrit 24.3, platelets 315.SARS COVID-19 was negative.Urinalysis white count more than 50, 0-5 red cells.  CT of the abdomen with increased longsegmental circumferential colonic wall thickening, abundant regional mesenteric inflammation, evidence of contained perforation into the sigmoid mesentery, possible fistula formation of the distal small bowel,complicated with left kidney obstructive uropathy. Chest radiograph no infiltrates.EKG 106 bpm, left axis deviation, normal intervals, sinus rhythm, no ST segment T wave changes.  Patient placed on  broad spectrum antibiotic therapy, and supportive IV fluids and IV analgesics. Resumed TPN and consulted surgery and GI.  Patient eventually will need colorectal surgery.  Persistent anemia, required 2 units PRBC transfusion.  Urine analysis with pyuria, urine culture positive for Pseudomonas, sensitive for cephalosporin.      Assessment & Plan:   Principal Problem:   Crohn's disease ([place]) Active Problems:   Sepsis ([place])   Hypokalemia   Cognitive communication disorder   Anemia   AKI (acute kidney injury) ([place])    1. Acute Crohn's colitis (contained perforation)/ sepsis present on admission/ end organ failure AKI/ pseudomonas urine infection (present on admission) Blood cultures continue with no growth, but urine culture positive for pesudomonas, sensitive to cephalosporins. Patient has been more somnolent this am.  Will decreased dose of hydromorphone to 0,5 mg as needed, continue with IV fluids, IV cefepime,antiemeticsand antiacids.  Continue nutrition per TPN.  Patient is somnolent, once mentation improves will do trial of full liquids.    2. AKI with hypokalemia/ hypomagnesemia/ ckd stage 3a. Stable renal function with serum cr at 1,20, will continue K and Mg correction per TPN per pharmacy protocol, today K down to 3,0 and Mg at 1,7. Follow renal panel in am.   3. Acute on chronic anemia/ likely acute on chronic blood loss due to IBD. Sp 2 unit PRBC transfusion. Check cell count in am.    4. Dyslipidemia.On statin therapy.   5. Severe calorie protein malnutrition/ stage 2 coccyx pressure ulcer (present on admission).OnTPN for nutrition. Will advance diet as tolerated pending mentation improvement.  6. Anxiety and depression. Continue with bupropion, melatonin, ambien and escitalopram.Tolerating well as needed alprazolam for anxiety.  DVT prophylaxis:enoxaparin Code Status:full Family Communication:no family at the bedside.  Disposition  Plan/ discharge barriers:patient from SNF, continue to need IV therapies and high nursing care, expected to dc to SNF.    Nutrition Status: Nutrition Problem: Inadequate oral intake Etiology: inability  to eat Signs/Symptoms: NPO status Interventions: TPN     Consultants:   Surgery   GI   Procedures:     Antimicrobials:   Cefepime.     Subjective: This am patient is somnolent, continue to have abdominal pain, and anxiety. Positive confusion, no nausea or vomiting.   Objective: Vitals:   07/06/19 0800 07/06/19 1200 07/06/19 2017 07/07/19 0400  BP:  130/62 (!) 102/53   Pulse:  (!) 109 100 96  Resp:  (!) 28 19 18   Temp: 98 F (36.7 C) 98 F (36.7 C) 98.9 F (37.2 C)   TempSrc: Oral Oral Oral   SpO2:  100% 93% 92%  Weight:      Height:        Intake/Output Summary (Last 24 hours) at 07/07/2019 1000 Last data filed at 07/07/2019 0844 Gross per 24 hour  Intake 2717.23 ml  Output 900 ml  Net 1817.23 ml   Filed Weights   06/26/2019 2051 07/04/19 0212  Weight: 46.7 kg 53 kg    Examination:   General: Not in pain or dyspnea, deconditioned  Neurology: somnolent but easy to arouse, with non focal deficits  E ENT:  Mild pallor, no icterus, oral mucosa moist Cardiovascular: No JVD. S1-S2 present, rhythmic, no gallops, rubs, or murmurs. No lower extremity edema. Pulmonary: positive  breath sounds bilaterally, adequate air movement, no wheezing, rhonchi or rales. Gastrointestinal. Abdomen with no organomegaly. Tender to palpation with no rebound or guarding. Skin. No rashes Musculoskeletal: no joint deformities     Data Reviewed: I have personally reviewed following labs and imaging studies  CBC: Recent Labs  Lab 07/04/2019 1539 07/04/19 0250 07/05/19 0353 07/06/19 1018  WBC 8.5 17.5* 9.1 8.1  NEUTROABS  --   --  7.2 7.7  HGB 7.0* 7.0* 6.7* 9.4*  HCT 24.3* 24.8* 22.1* 30.4*  MCV 90.7 97.3 90.6 90.7  PLT 315 240 211 937   Basic Metabolic  Panel: Recent Labs  Lab 07/10/2019 1539 07/04/19 0250 07/05/19 0353 07/05/19 2013 07/07/19 0214  NA 137 143 143 141 137  K 2.6* 3.5 3.0* 3.9 3.0*  CL 104 115* 118* 116* 109  CO2 21* 15* 17* 16* 20*  GLUCOSE 124* 78 70 305* 175*  BUN 33* 32* 36* 31* 31*  CREATININE 1.43* 1.77* 1.50* 1.48* 1.20*  CALCIUM 8.1* 7.5* 7.7* 7.6* 7.5*  MG  --  1.9 1.9 2.3 1.7  PHOS  --  3.3 2.9 1.9* 1.0*   GFR: Estimated Creatinine Clearance: 38.6 mL/min (A) (by C-G formula based on SCr of 1.2 mg/dL (H)). Liver Function Tests: Recent Labs  Lab 06/30/2019 1539 07/04/19 0250 07/05/19 0353  AST 42* 141* 77*  ALT 27 57* 46*  ALKPHOS 178* 227* 222*  BILITOT 0.9 1.5* 2.0*  PROT 7.0 5.8* 5.4*  ALBUMIN 1.2* 1.1* 1.1*   No results for input(s): LIPASE, AMYLASE in the last 168 hours. No results for input(s): AMMONIA in the last 168 hours. Coagulation Profile: Recent Labs  Lab 06/16/2019 1712  INR 1.4*   Cardiac Enzymes: No results for input(s): CKTOTAL, CKMB, CKMBINDEX, TROPONINI in the last 168 hours. BNP (last 3 results) No results for input(s): PROBNP in the last 8760 hours. HbA1C: Recent Labs    07/06/19 1018  HGBA1C 5.9*   CBG: Recent Labs  Lab 07/06/19 1149 07/06/19 1727 07/06/19 2020 07/06/19 2355 07/07/19 0505  GLUCAP 146* 140* 204* 208* 196*   Lipid Profile: Recent Labs    07/05/19 0353  TRIG 150*  Thyroid Function Tests: No results for input(s): TSH, T4TOTAL, FREET4, T3FREE, THYROIDAB in the last 72 hours. Anemia Panel: No results for input(s): VITAMINB12, FOLATE, FERRITIN, TIBC, IRON, RETICCTPCT in the last 72 hours.    Radiology Studies: I have reviewed all of the imaging during this hospital visit personally     Scheduled Meds: . sodium chloride   Intravenous Once  . buPROPion  300 mg Oral Daily  . Chlorhexidine Gluconate Cloth  6 each Topical Daily  . cholecalciferol  1,000 Units Oral Daily  . escitalopram  5 mg Oral Daily  . feeding supplement (PRO-STAT  SUGAR FREE 64)  30 mL Oral Daily  . insulin aspart  0-9 Units Subcutaneous Q6H  . liver oil-zinc oxide   Topical 6 X Daily  . melatonin  3 mg Oral QHS  . pantoprazole  40 mg Oral Daily  . pravastatin  20 mg Oral Daily  . saccharomyces boulardii  250 mg Oral BID  . sodium chloride flush  10-40 mL Intracatheter Q12H  . sodium chloride flush  10-40 mL Intracatheter Q12H  . zinc sulfate  220 mg Oral Daily   Continuous Infusions: . sodium chloride    . sodium chloride Stopped (07/06/19 0028)  . ceFEPime (MAXIPIME) IV 2 g (07/07/19 0758)  . dextrose 5% lactated ringers 35 mL/hr at 07/07/19 0446  . potassium chloride 10 mEq (07/07/19 0946)  . potassium PHOSPHATE IVPB (in mmol)    . TPN CYCLIC-ADULT (ION)       LOS: 4 days        Cloyce Blankenhorn Gerome Apley, MD

## 2019-07-07 NOTE — Progress Notes (Signed)
PHARMACY - TOTAL PARENTERAL NUTRITION CONSULT NOTE   Indication: Chronic; intolerance to enteral feeding  Patient Measurements: Height: 5' 4"  (162.6 cm) Weight: 116 lb 13.5 oz (53 kg) IBW/kg (Calculated) : 54.7 TPN AdjBW (KG): 53 Body mass index is 20.06 kg/m.  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 She was admitted from [facility] facility with concern for sepsis. CT on 3/23 shows contained perforation in the sigmoid mesentery and possible fistula formation to distal small bowel.  No need for emergent surgery per Surgery team but may perform diverting ostomy. She is followed by [facility] and was to have surgery last week but it was post-poned by patient. Pharmacy consulted to continue TPN management.   Glucose / Insulin: CBG 169 - 208 (NPO on D5 with LR at 75 ml/hr) Electrolytes: K 3 - will replace, Mg 1.7, Phos 1 - will replace, CO2 20, Cl 109, CoCa 10.  Renal: SCr down to 1.2, BUN 31.  LFTs / TGs: AST/ALT elevated on admission - now trending down (77/46), Tbili up 2, TG 150. No sign of jaundice per RN.  Prealbumin / albumin: Prealbumin <5 / Albumin 1.1 Intake / Output; MIVF: UOP 1.4 mL/kg/hr. Stools x 3.  GI Imaging: none since resuming TPN Surgeries / Procedures: none since resuming TPN  Central access: PICC (placed 04/18/19) TPN start date: Restart 3/25   Nutritional Goals (per RD recommendation 3/25): Kcal:  1750-1950 Protein:  85-100 grams Fluid:  >/= 1.8 L/day  Current Nutrition:  Clear liquid diet TPN - Chronic, has only missed one day  Chronic Concentrated TPN Formula (from [facility] [place]): Infuse 1400 mL over 16 hours with 1 hour taper up and down:  65g AA, 240g Dextrose with 16m trace elements, 215 mEq Na, 30 mEq K, 6 mmol Phos, no Calcium, and 10 mEq Mag, and  CWV:PXTGGYI2:1. 50g Lipids four times per week. Providing 1576 kcals on lipid days and 1076 kcal on non-lipid days (average 1362 kcals/day). *Per ASan BenitoPharmacist, patient has been on TPN for months with little improvement in weight. Patient had been sensitive to electrolytes but lately has been requiring more. Patient was also on a regular diet in addition to TPN up until admission.   Plan:  Restart cyclic TPN at 19485PM : Infuse 1500 mL over 16 hours, rate of 50 ml/hr x 1 hour, then 100 ml/hr x 14 hrs, then 50 ml/hr x 1 hour, providing 84g AA, 259g CHO and 0g ILE for a total of 1215  , meeting ~100% of patient needs Electrolytes in TPN: 120 mEq/L Na (decrease), 40 mEq/L K (increase), no Calcium, 8 mEq/L Mg, and 10 mmol/L Phos, adjust Cl:Ac at 1:2 Add MVI on MWF only due to nProducer, television/film/video  Add trace elements daily to TPN. Will start sensitive SSI q6hr. D/c D5LR @ 1800 KPhos 30 mmol x 1 Mag 2 gm IV x 1 KCl 10 meq x 4 Monitor for any signs of jaundice with elevated bilirubin - if jaundice, will reduce trace elements to half.   TPN labs Mon/Thurs Will recheck BMET/Mg/Phos in am.   CAlanda Slim PharmD, FSpring View HospitalClinical Pharmacist Please see AMION for all Pharmacists' Contact Phone Numbers 07/07/2019, 7:22 AM

## 2019-07-07 NOTE — Progress Notes (Signed)
MD was notified about patient's elevated HR and that she sounds like being congested. Temp come down to 100.6 (rectal). Will continue to monitor.

## 2019-07-07 NOTE — Progress Notes (Signed)
Patient ID: Katrina Boyer, female   DOB: 11/21/1950, 69 y.o.   MRN: [id number]       Subjective: Patient was nonverbal with me but clearly irritated with her room phone upon my arrival.  When I asked her when Dr. Maude Leriche planned to operate on her at [facility], she became angry for unclear reasons and swung at me and hit my stethoscope yanking it out my ears and jerking my head.  ROS: unable  Objective: Vital signs in last 24 hours: Temp:  [98 F (36.7 C)-98.9 F (37.2 C)] 98.9 F (37.2 C) (03/26 2017) Pulse Rate:  [96-109] 96 (03/27 0400) Resp:  [18-28] 18 (03/27 0400) BP: (102-130)/(53-62) 102/53 (03/26 2017) SpO2:  [92 %-100 %] 92 % (03/27 0400) Last BM Date: 07/06/19  Intake/Output from previous day: 03/26 0701 - 03/27 0700 In: 2567.2 [I.V.:2467.2; IV Piggyback:100] Out: 1800 [Urine:1800] Intake/Output this shift: Total I/O In: 150 [IV Piggyback:150] Out: -   PE: Gen: agitated, but won't speak to me Abd: appears distended, limited exam as patient then began getting violent  Lab Results:  Recent Labs    07/05/19 0353 07/06/19 1018  WBC 9.1 8.1  HGB 6.7* 9.4*  HCT 22.1* 30.4*  PLT 211 166   BMET Recent Labs    07/05/19 2013 07/07/19 0214  NA 141 137  K 3.9 3.0*  CL 116* 109  CO2 16* 20*  GLUCOSE 305* 175*  BUN 31* 31*  CREATININE 1.48* 1.20*  CALCIUM 7.6* 7.5*   PT/INR No results for input(s): LABPROT, INR in the last 72 hours. CMP     Component Value Date/Time   NA 137 07/07/2019 0214   K 3.0 (L) 07/07/2019 0214   CL 109 07/07/2019 0214   CO2 20 (L) 07/07/2019 0214   GLUCOSE 175 (H) 07/07/2019 0214   BUN 31 (H) 07/07/2019 0214   CREATININE 1.20 (H) 07/07/2019 0214   CALCIUM 7.5 (L) 07/07/2019 0214   PROT 5.4 (L) 07/05/2019 0353   ALBUMIN 1.1 (L) 07/05/2019 0353   AST 77 (H) 07/05/2019 0353   ALT 46 (H) 07/05/2019 0353   ALKPHOS 222 (H) 07/05/2019 0353   BILITOT 2.0 (H) 07/05/2019 0353   GFRNONAA 47 (L) 07/07/2019 0214   GFRAA 55 (L) 07/07/2019  0214   Lipase  No results found for: LIPASE     Studies/Results: DG Abd 1 View  Result Date: 07/06/2019 CLINICAL DATA:  Bowel obstruction. EXAM: ABDOMEN - 1 VIEW COMPARISON:  07/02/2019 FINDINGS: Interval improvement in previously noted dilated small bowel loops. No pathologic dilatation of large or small bowel loops noted at this time. There is a stone within the right kidney measuring 6 mm. IMPRESSION: 1. Improving small bowel distension. 2. Right renal calculus. Electronically Signed   By: Kerby Moors M.D.   On: 07/06/2019 10:57    Anti-infectives: Anti-infectives (From admission, onward)   Start     Dose/Rate Route Frequency Ordered Stop   07/06/19 2000  ceFEPIme (MAXIPIME) 2 g in sodium chloride 0.9 % 100 mL IVPB     2 g 200 mL/hr over 30 Minutes Intravenous Every 12 hours 07/06/19 1046 07/12/19 2359   07/06/19 1030  cefTRIAXone (ROCEPHIN) 1 g in sodium chloride 0.9 % 100 mL IVPB  Status:  Discontinued     1 g 200 mL/hr over 30 Minutes Intravenous Every 24 hours 07/06/19 1016 07/06/19 1046   07/05/19 1200  ceFEPIme (MAXIPIME) 2 g in sodium chloride 0.9 % 100 mL IVPB  Status:  Discontinued  2 g 200 mL/hr over 30 Minutes Intravenous Every 12 hours 07/05/19 1105 07/06/19 1016   07/04/19 1800  vancomycin (VANCOREADY) IVPB 500 mg/100 mL  Status:  Discontinued     500 mg 100 mL/hr over 60 Minutes Intravenous Every 24 hours 06/28/2019 1741 07/04/19 1401   07/04/19 1800  ceFEPIme (MAXIPIME) 2 g in sodium chloride 0.9 % 100 mL IVPB  Status:  Discontinued     2 g 200 mL/hr over 30 Minutes Intravenous Every 24 hours 06/17/2019 1741 07/05/19 1105   07/04/2019 1730  ceFEPIme (MAXIPIME) 2 g in sodium chloride 0.9 % 100 mL IVPB     2 g 200 mL/hr over 30 Minutes Intravenous  Once 06/21/2019 1726 06/22/2019 1845   07/06/2019 1715  vancomycin (VANCOCIN) IVPB 1000 mg/200 mL premix     1,000 mg 200 mL/hr over 60 Minutes Intravenous  Once 07/07/2019 1712 06/28/2019 1845   06/16/2019 1715  aztreonam  (AZACTAM) 2 g in sodium chloride 0.9 % 100 mL IVPB  Status:  Discontinued     2 g 200 mL/hr over 30 Minutes Intravenous  Once 06/14/2019 1714 06/25/2019 1726       Assessment/Plan GERD HLD Acute on chronic anemia AKI on CKD Severe malnutrition - albumin 1.2, prealbumin < 5 Delirium   Sepsis  Acute on chronic normocytic anemia/ suspect acute on chronic blood loss due to IBD - 1u PRBC 3/25 consider post-transfusion CBC   Crohns disease with perianal involvement (not on medications) - GI following, recommend consideration of biologic therapy at [facility] once acute infection resolved. 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 Does not need emergent surgery today. Udx with > 1000000 pseudomonas. c diff negative. Lactic acid has normalized with resuscitation. Continue antibiotics and resuscitation.  She eventually needs to have surgery. She refused this last week at [facility] and not sure when I discuss with her today. She did not want our team to talk to her son or sister who are listed - per RN, yesterday it was established that the patients daughter , Mariann Laster, wound be the contact person for the patient.  She is very malnourished and we would likely plan diverting ostomy at most. With prealbumin less than five there is no role for anything more than that.  ID -vancomycin 3/23, maxipime 3/23>> (day#3), wbc normal, urine sensitivities are back and sensitive to cipro. Ok to narrow abx to cipro from surgical standpoint but please also add flagyl for more intra-abdominal anaerobic coverage.  FEN -IVF,  NPO, TPN VTE -SCDs, lovenox Foley -none  Follow up -patient very likely needs to be transferred to [facility] at this point given her establishment there and complexity of her disease process.     LOS: 4 days    Henreitta Cea , [facility] Surgery 07/07/2019, 9:51 AM Please see Amion for pager number during day hours 7:00am-4:30pm or 7:00am -11:30am on weekends

## 2019-07-07 NOTE — Progress Notes (Signed)
CRITICAL VALUE ALERT  Critical Value:  MRSA PCR positive  Date & Time Notied:  07/07/2019; 16:55  Provider Notified: Dr. Cathlean Sauer  Orders Received/Actions taken: see MAR

## 2019-07-07 NOTE — Progress Notes (Signed)
Pharmacy Antibiotic Note  Katrina Boyer is a 69 y.o. female admitted on 06/25/2019 with sepsis. Pharmacy consult  for cefepime dosing.  Now asked to add vancomycin.  WBC rising, Scr improving.  Plan: Add vancomycin 1g q 36 hrs.  Will check levels at steady state. Continue Cefepime dose 2g IV Q12h F/u clinical progress, c/s, de-escalation, and LOT  Height: 5' 4"  (162.6 cm) Weight: 116 lb 13.5 oz (53 kg) IBW/kg (Calculated) : 54.7  Temp (24hrs), Avg:99.8 F (37.7 C), Min:98.6 F (37 C), Max:101.1 F (38.4 C)  Recent Labs  Lab 07/02/2019 1539 06/22/2019 1727 07/04/19 0250 07/04/19 1151 07/05/19 0353 07/05/19 2013 07/06/19 1018 07/07/19 0214 07/07/19 1613  WBC 8.5  --  17.5*  --  9.1  --  8.1  --  22.0*  CREATININE 1.43*  --  1.77*  --  1.50* 1.48*  --  1.20*  --   LATICACIDVEN 3.6* 3.7*  --  1.9  --   --   --   --   --     Estimated Creatinine Clearance: 38.6 mL/min (A) (by C-G formula based on SCr of 1.2 mg/dL (H)).    Allergies  Allergen Reactions  . Piperacillin-Tazobactam In Dex     Thrombocytopenia   . Metronidazole     neuropathy  . Gabapentin     Unknown  . Mercaptopurine Nausea Only  . Ciprofloxacin Rash      Thank you for allowing pharmacy to be a part of this patient's care.  Marguerite Olea, [facility] Clinical Pharmacist Phone [phone]  07/07/2019 5:51 PM

## 2019-07-07 NOTE — Progress Notes (Signed)
Patient's temp is now 101.1. Patient received Tylenol sup per PRN order. MD was notified. Will cotninue to monitor.

## 2019-07-07 NOTE — Progress Notes (Addendum)
Patient has developed hypotension 86/55 mmHG , fever 100.6, hypoxic respiratory failure (91% on 4 L/ min per [place]), tachycardia 112 bpm. Her mentation has continue to worsened and now she is not responsive to voice or touch, only grimacing to pain stimuli.   Worsening sepsis with features of septic shock.  1. NS 1000 ml bolus 2. Chest X ray 3. ABG 4. CBC 5. Add IV vancomycin.   I spoke with Mrs. Katrina Boyer, patient's sister about patient's worsening clinical condition. Mrs Katrina Boyer has a poor prognosis considering her poor nutrition, and diagnosis of sepsis now with worsening hemodynamic parameters.  Patient CODE STATUS will be changed to DNR apparently Ms. Katrina Boyer has expressed in the past that in this condition not to pursue aggressive/resuscitation measures.

## 2019-07-07 NOTE — Progress Notes (Signed)
Patient HR in low 110; BP 95/64; sat O2 90% on 4 L oxygen [place]; temp 99.9 rectal. Patient drowsy, noticed crackles on ascultation. MD made aware. Will continue to monitor.

## 2019-07-08 ENCOUNTER — Inpatient Hospital Stay (HOSPITAL_COMMUNITY): Payer: Medicare Other

## 2019-07-08 LAB — CBC WITH DIFFERENTIAL/PLATELET
Abs Immature Granulocytes: 2.67 10*3/uL — ABNORMAL HIGH (ref 0.00–0.07)
Basophils Absolute: 0.1 10*3/uL (ref 0.0–0.1)
Basophils Relative: 0 %
Eosinophils Absolute: 0.2 10*3/uL (ref 0.0–0.5)
Eosinophils Relative: 1 %
HCT: 30.6 % — ABNORMAL LOW (ref 36.0–46.0)
Hemoglobin: 9.5 g/dL — ABNORMAL LOW (ref 12.0–15.0)
Immature Granulocytes: 10 %
Lymphocytes Relative: 12 %
Lymphs Abs: 3.4 10*3/uL (ref 0.7–4.0)
MCH: 28.1 pg (ref 26.0–34.0)
MCHC: 31 g/dL (ref 30.0–36.0)
MCV: 90.5 fL (ref 80.0–100.0)
Monocytes Absolute: 1 10*3/uL (ref 0.1–1.0)
Monocytes Relative: 4 %
Neutro Abs: 20.6 10*3/uL — ABNORMAL HIGH (ref 1.7–7.7)
Neutrophils Relative %: 73 %
Platelets: 111 10*3/uL — ABNORMAL LOW (ref 150–400)
RBC: 3.38 MIL/uL — ABNORMAL LOW (ref 3.87–5.11)
RDW: 17.5 % — ABNORMAL HIGH (ref 11.5–15.5)
WBC: 27.9 10*3/uL — ABNORMAL HIGH (ref 4.0–10.5)
nRBC: 0.1 % (ref 0.0–0.2)

## 2019-07-08 LAB — BASIC METABOLIC PANEL
Anion gap: 7 (ref 5–15)
BUN: 37 mg/dL — ABNORMAL HIGH (ref 8–23)
CO2: 21 mmol/L — ABNORMAL LOW (ref 22–32)
Calcium: 7.4 mg/dL — ABNORMAL LOW (ref 8.9–10.3)
Chloride: 112 mmol/L — ABNORMAL HIGH (ref 98–111)
Creatinine, Ser: 1.36 mg/dL — ABNORMAL HIGH (ref 0.44–1.00)
GFR calc Af Amer: 47 mL/min — ABNORMAL LOW (ref >60)
GFR calc non Af Amer: 40 mL/min — ABNORMAL LOW (ref >60)
Glucose, Bld: 207 mg/dL — ABNORMAL HIGH (ref 70–99)
Potassium: 4.6 mmol/L (ref 3.5–5.1)
Sodium: 140 mmol/L (ref 135–145)

## 2019-07-08 LAB — CULTURE, BLOOD (ROUTINE X 2)
Culture: NO GROWTH
Culture: NO GROWTH
Special Requests: ADEQUATE
Special Requests: ADEQUATE

## 2019-07-08 LAB — MAGNESIUM: Magnesium: 2.5 mg/dL — ABNORMAL HIGH (ref 1.7–2.4)

## 2019-07-08 LAB — GLUCOSE, CAPILLARY
Glucose-Capillary: 196 mg/dL — ABNORMAL HIGH (ref 70–99)
Glucose-Capillary: 115 mg/dL — ABNORMAL HIGH (ref 70–99)

## 2019-07-08 LAB — PHOSPHORUS: Phosphorus: 2 mg/dL — ABNORMAL LOW (ref 2.5–4.6)

## 2019-07-08 MED ORDER — HALOPERIDOL LACTATE 2 MG/ML PO CONC
0.5000 mg | ORAL | Status: DC | PRN
  Filled 2019-07-08: qty 0.3

## 2019-07-08 MED ORDER — ACETAMINOPHEN 325 MG PO TABS: 650.0000 mg | ORAL_TABLET | Freq: Four times a day (QID) | ORAL | Status: DC | PRN

## 2019-07-08 MED ORDER — POLYVINYL ALCOHOL 1.4 % OP SOLN
1.0000 [drp] | Freq: Four times a day (QID) | OPHTHALMIC | Status: DC | PRN
  Filled 2019-07-08: qty 15

## 2019-07-08 MED ORDER — MORPHINE 100MG IN NS 100ML (1MG/ML) PREMIX INFUSION
2.0000 mg/h | INTRAVENOUS | Status: DC
  Administered 2019-07-08: 2 mg/h via INTRAVENOUS
  Filled 2019-07-08: qty 100

## 2019-07-08 MED ORDER — SODIUM PHOSPHATES 45 MMOLE/15ML IV SOLN
20.0000 mmol | Freq: Once | INTRAVENOUS | Status: AC
  Administered 2019-07-08: 20 mmol via INTRAVENOUS
  Filled 2019-07-08: qty 6.67

## 2019-07-08 MED ORDER — TRAVASOL 10 % IV SOLN
INTRAVENOUS | Status: DC
  Filled 2019-07-08: qty 840

## 2019-07-08 MED ORDER — GLYCOPYRROLATE 0.2 MG/ML IJ SOLN: 0.2000 mg | INTRAMUSCULAR | Status: DC | PRN

## 2019-07-08 MED ORDER — HALOPERIDOL 0.5 MG PO TABS
0.5000 mg | ORAL_TABLET | ORAL | Status: DC | PRN
  Filled 2019-07-08: qty 1

## 2019-07-08 MED ORDER — ACETAMINOPHEN 650 MG RE SUPP
650.0000 mg | Freq: Four times a day (QID) | RECTAL | Status: DC | PRN
  Administered 2019-07-09: 650 mg via RECTAL
  Filled 2019-07-08: qty 1

## 2019-07-08 MED ORDER — ONDANSETRON 4 MG PO TBDP: 4.0000 mg | ORAL_TABLET | Freq: Four times a day (QID) | ORAL | Status: DC | PRN

## 2019-07-08 MED ORDER — ONDANSETRON HCL 4 MG/2ML IJ SOLN: 4.0000 mg | Freq: Four times a day (QID) | INTRAMUSCULAR | Status: DC | PRN

## 2019-07-08 MED ORDER — GLYCOPYRROLATE 1 MG PO TABS: 1.0000 mg | ORAL_TABLET | ORAL | Status: DC | PRN

## 2019-07-08 MED ORDER — GLYCOPYRROLATE 0.2 MG/ML IJ SOLN
0.2000 mg | INTRAMUSCULAR | Status: DC | PRN
  Administered 2019-07-09: 0.2 mg via INTRAVENOUS
  Filled 2019-07-08: qty 1

## 2019-07-08 MED ORDER — HALOPERIDOL LACTATE 5 MG/ML IJ SOLN: 0.5000 mg | INTRAMUSCULAR | Status: DC | PRN

## 2019-07-08 MED ORDER — BIOTENE DRY MOUTH MT LIQD: 15.0000 mL | OROMUCOSAL | Status: DC | PRN

## 2019-07-08 NOTE — Progress Notes (Signed)
PROGRESS NOTE    Katrina Boyer  TTS:[id number] DOB: Aug 12, 1950 DOA: 06/21/2019 PCP: Seward Carol, MD    Brief Narrative:  Patient was admitted to the hospital working diagnosis acute colitis(contained perforation), complicated by sepsis, endorgan failure acute kidney injury/ hypokalemia and hypomagnesemia.Pseudomonas urine infection.   69 year old female who presented with anemia. She does have significant past medical history of Crohn's disease (chronic TPN), chronic kidney disease stage III, dyslipidemia and cognitive impairment. She was transferred from the nursing home due to low hemoglobin. Patient unable to give any detailed history due to cognitive impairment. Patient was seen by general surgery 3/16at 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 Patient was noted to have generalized weakness, lethargy and fever. Positive abdominal pain and distention. Upon her initial physical examination she was febrile 103 F, heart rate 122, respirate 31, oxygen saturation 91%, moist mucous membranes, lungs clear to auscultation bilaterally, heart S1-S2 present and rhythmic,abdomen distended, generalized tenderness, no lower extremity edema. Sodium 137, potassium 2.6, chloride 104, bicarb 21, glucose 124, BUN 33, creatinine 1.43, white count 8.5, hemoglobin 7.0, hematocrit 24.3, platelets 315.SARS COVID-19 was negative.Urinalysis white count more than 50, 0-5 red cells.  CT of the abdomen with increased longsegmental circumferential colonic wall thickening, abundant regional mesenteric inflammation, evidence of contained perforation into the sigmoid mesentery, possible fistula formation of the distal small bowel,complicated with left kidney obstructive uropathy. Chest radiograph no infiltrates.EKG 106 bpm, left axis deviation, normal intervals, sinus rhythm, no ST segment T wave changes.  Patient placed on  broad spectrum antibiotic therapy, and supportive IV fluids and IV analgesics. Resumed TPN and consulted surgery and GI.  Patient eventually will need colorectal surgery.  Patient clinically worsening sepsis, with hypotension and encephalopathy, very poor prognosis due to chronic illness and malnutrition. CODE status has been changed to DNR.     Assessment & Plan:   Principal Problem:   Crohn's disease ([place]) Active Problems:   Sepsis ([place])   Hypokalemia   Cognitive communication disorder   Anemia   AKI (acute kidney injury) ([place])    1. Acute Crohn's colitis (contained perforation)/ sepsis present on admission/ end organ failure AKI/ pseudomonas urine infection (present on admission) Blood cultures continue with no growth, but urine culture positive for pesudomonas, sensitive to cephalosporins.   Continue worsening hypotension and encephalopathy. WBC up to 27.9. Patient received volume resuscitation yesterday, blood pressure this am up to 092 systolic, but now down again to 86 systolic.   Case discussed with the surgical team, will follow new abdominal CT for further clarify immediate prognosis. Her family is at the bedside and would like to avoid any further suffering.   For now will continue antibiotic therapy with vancomycin and cefepime.   2. AKI with hypokalemia/ hypomagnesemia/ ckd stage 3a. worsening renal function with serum cr up to 1,36 with K at 4,6 and serum bicarbonate at 21. Urine output 600 cc over last 24 H. Recurrent hypotension.   Will continue IV fluids for now, patient with very poor prognosis.  3. Acute on chronic anemia/ likely acute on chronic blood loss due to IBD. Sp2unit PRBC transfusion.Hgb this am at 9,5 with Hct at 30 and plt down to 111 (worsening thrombocytopenia)  4. Dyslipidemia.Patient npo now due to encephalopathy. Holdingstatin therapy.   5. Severe calorie protein malnutrition/ stage 2 coccyx pressure ulcer (present on  admission).Patient with poor prognosis, will hold onTPN for now.Not able to tolerate po intake.  6. Anxiety and depression.Not able to  take her medications: bupropion, melatonin, ambien and escitalopram, due to encephalopathy  7. T2DM. Glucose this am 207, continue insulin sliding scale for glucose cover and monitoring. Patient is npo.   DVT prophylaxis:enoxaparin Code Status:full Family Communication:I spoke with patient's sister and son at the bedside, we talked in detail about patient's condition, plan of care and prognosis and all questions were addressed.  Disposition Plan/ discharge barriers:patient from SNF, continue to need IV therapies and high nursing care, expected to dc to SNF.    Nutrition Status: Nutrition Problem: Inadequate oral intake Etiology: inability to eat Signs/Symptoms: NPO status Interventions: TPN      Consultants:   Surgery      Antimicrobials:   Cefepime and vancomycin     Subjective: Patient is somnolent and hyporeactive, positive pain to abdominal palpation.   Objective: Vitals:   07/08/19 0410 07/08/19 0800 07/08/19 0844 07/08/19 1000  BP:  104/68  102/60  Pulse: 100   (!) 112  Resp: 20 (!) 23 (!) 21 (!) 21  Temp:      TempSrc:      SpO2: 98%   95%  Weight:      Height:        Intake/Output Summary (Last 24 hours) at 07/08/2019 1140 Last data filed at 07/08/2019 1058 Gross per 24 hour  Intake 3376.03 ml  Output 600 ml  Net 2776.03 ml   Filed Weights   06/25/2019 2051 07/04/19 0212  Weight: 46.7 kg 53 kg    Examination:   General: deconditioned and ill looking appearing  Neurology: lethargic and hyporeactive, open eyes to touch, no verbal or motor response.  E ENT: positive pallor, no icterus, oral mucosa dry Cardiovascular: No JVD. S1-S2 present, rhythmic, no gallops, rubs, or murmurs. No lower extremity edema. Pulmonary: positive breath sounds bilaterally, adequate air movement, no wheezing, rhonchi or  rales. Gastrointestinal. Abdomen distended and tender to palpation. Skin. No rashes Musculoskeletal: no joint deformities     Data Reviewed: I have personally reviewed following labs and imaging studies  CBC: Recent Labs  Lab 07/04/19 0250 07/05/19 0353 07/06/19 1018 07/07/19 1613 07/08/19 0854  WBC 17.5* 9.1 8.1 22.0* 27.9*  NEUTROABS  --  7.2 7.7 15.7* 20.6*  HGB 7.0* 6.7* 9.4* 8.9* 9.5*  HCT 24.8* 22.1* 30.4* 29.1* 30.6*  MCV 97.3 90.6 90.7 89.8 90.5  PLT 240 211 166 146* 935*   Basic Metabolic Panel: Recent Labs  Lab 07/04/19 0250 07/05/19 0353 07/05/19 2013 07/07/19 0214 07/08/19 0854  NA 143 143 141 137 140  K 3.5 3.0* 3.9 3.0* 4.6  CL 115* 118* 116* 109 112*  CO2 15* 17* 16* 20* 21*  GLUCOSE 78 70 305* 175* 207*  BUN 32* 36* 31* 31* 37*  CREATININE 1.77* 1.50* 1.48* 1.20* 1.36*  CALCIUM 7.5* 7.7* 7.6* 7.5* 7.4*  MG 1.9 1.9 2.3 1.7 2.5*  PHOS 3.3 2.9 1.9* 1.0* 2.0*   GFR: Estimated Creatinine Clearance: 34 mL/min (A) (by C-G formula based on SCr of 1.36 mg/dL (H)). Liver Function Tests: Recent Labs  Lab 06/11/2019 1539 07/04/19 0250 07/05/19 0353  AST 42* 141* 77*  ALT 27 57* 46*  ALKPHOS 178* 227* 222*  BILITOT 0.9 1.5* 2.0*  PROT 7.0 5.8* 5.4*  ALBUMIN 1.2* 1.1* 1.1*   No results for input(s): LIPASE, AMYLASE in the last 168 hours. No results for input(s): AMMONIA in the last 168 hours. Coagulation Profile: Recent Labs  Lab 07/11/2019 1712  INR 1.4*   Cardiac Enzymes: No results for  input(s): CKTOTAL, CKMB, CKMBINDEX, TROPONINI in the last 168 hours. BNP (last 3 results) No results for input(s): PROBNP in the last 8760 hours. HbA1C: Recent Labs    07/06/19 1018  HGBA1C 5.9*   CBG: Recent Labs  Lab 07/07/19 0505 07/07/19 1200 07/07/19 1757 07/07/19 2323 07/08/19 0537  GLUCAP 196* 130* 73 204* 196*   Lipid Profile: No results for input(s): CHOL, HDL, LDLCALC, TRIG, CHOLHDL, LDLDIRECT in the last 72 hours. Thyroid Function  Tests: No results for input(s): TSH, T4TOTAL, FREET4, T3FREE, THYROIDAB in the last 72 hours. Anemia Panel: No results for input(s): VITAMINB12, FOLATE, FERRITIN, TIBC, IRON, RETICCTPCT in the last 72 hours.    Radiology Studies: I have reviewed all of the imaging during this hospital visit personally     Scheduled Meds: . sodium chloride   Intravenous Once  . buPROPion  300 mg Oral Daily  . Chlorhexidine Gluconate Cloth  6 each Topical Daily  . Chlorhexidine Gluconate Cloth  6 each Topical Q0600  . cholecalciferol  1,000 Units Oral Daily  . escitalopram  5 mg Oral Daily  . feeding supplement (PRO-STAT SUGAR FREE 64)  30 mL Oral Daily  . insulin aspart  0-9 Units Subcutaneous Q6H  . liver oil-zinc oxide   Topical 6 X Daily  . melatonin  3 mg Oral QHS  . mupirocin ointment  1 application Nasal BID  . pantoprazole  40 mg Oral Daily  . pravastatin  20 mg Oral Daily  . saccharomyces boulardii  250 mg Oral BID  . sodium chloride flush  10-40 mL Intracatheter Q12H  . sodium chloride flush  10-40 mL Intracatheter Q12H  . zinc sulfate  220 mg Oral Daily   Continuous Infusions: . sodium chloride    . sodium chloride Stopped (07/06/19 0028)  . ceFEPime (MAXIPIME) IV 2 g (07/08/19 0732)  . sodium phosphate  Dextrose 5% IVPB 20 mmol (07/08/19 1058)  . TPN CYCLIC-ADULT (ION)    . vancomycin Stopped (07/08/19 0235)     LOS: 5 days        Darrik Richman Gerome Apley, MD

## 2019-07-08 NOTE — Progress Notes (Signed)
Patient ID: Katrina Boyer, female   DOB: 11/08/1950, 69 y.o.   MRN: [id number]       Subjective: Patient vomiting her jello upon my entry.  Less agitated today than yesterday.  Appears to want to try and answer questions but having a very difficult time.  She does nod that she is having worsening abdominal pain today.  Vitals show fevers, increase in tachy, and more hypotension since yesterday.  ROS: unable due to mental acuity  Objective: Vital signs in last 24 hours: Temp:  [98.6 F (37 C)-101.1 F (38.4 C)] 99 F (37.2 C) (03/28 0400) Pulse Rate:  [100-113] 112 (03/28 1000) Resp:  [13-25] 21 (03/28 1000) BP: (83-104)/(51-68) 102/60 (03/28 1000) SpO2:  [90 %-98 %] 95 % (03/28 1000) Last BM Date: 07/08/19  Intake/Output from previous day: 03/27 0701 - 03/28 0700 In: 3876 [I.V.:1326; IV Piggyback:2550] Out: 600 [Urine:600] Intake/Output this shift: Total I/O In: 100 [IV Piggyback:100] Out: -   PE: Heart: tachy Lungs: CTAB, but decrease BS Abd: distended, tender to palpation, hypoactive BS, liquid diarrhea between her legs  Lab Results:  Recent Labs    07/07/19 1613 07/08/19 0854  WBC 22.0* 27.9*  HGB 8.9* 9.5*  HCT 29.1* 30.6*  PLT 146* 111*   BMET Recent Labs    07/07/19 0214 07/08/19 0854  NA 137 140  K 3.0* 4.6  CL 109 112*  CO2 20* 21*  GLUCOSE 175* 207*  BUN 31* 37*  CREATININE 1.20* 1.36*  CALCIUM 7.5* 7.4*   PT/INR No results for input(s): LABPROT, INR in the last 72 hours. CMP     Component Value Date/Time   NA 140 07/08/2019 0854   K 4.6 07/08/2019 0854   CL 112 (H) 07/08/2019 0854   CO2 21 (L) 07/08/2019 0854   GLUCOSE 207 (H) 07/08/2019 0854   BUN 37 (H) 07/08/2019 0854   CREATININE 1.36 (H) 07/08/2019 0854   CALCIUM 7.4 (L) 07/08/2019 0854   PROT 5.4 (L) 07/05/2019 0353   ALBUMIN 1.1 (L) 07/05/2019 0353   AST 77 (H) 07/05/2019 0353   ALT 46 (H) 07/05/2019 0353   ALKPHOS 222 (H) 07/05/2019 0353   BILITOT 2.0 (H) 07/05/2019 0353   GFRNONAA 40 (L) 07/08/2019 0854   GFRAA 47 (L) 07/08/2019 0854   Lipase  No results found for: LIPASE     Studies/Results: DG Chest 1 View  Result Date: 07/07/2019 CLINICAL DATA:  Cognitive disorder. EXAM: CHEST  1 VIEW COMPARISON:  Chest radiograph 06/13/2019 FINDINGS: Left upper extremity PICC line tip projects over the superior vena cava. Monitoring leads overlie the patient. Cardiomegaly. Pulmonary vascular redistribution and bilateral perihilar interstitial pulmonary opacities. No pleural effusion or pneumothorax. Thoracic spine degenerative changes. IMPRESSION: Cardiomegaly. Bilateral perihilar interstitial and diffuse lung interstitial opacities favored to represent pulmonary edema. Atypical infection could appear similar. Electronically Signed   By: Lovey Newcomer M.D.   On: 07/07/2019 16:51    Anti-infectives: Anti-infectives (From admission, onward)   Start     Dose/Rate Route Frequency Ordered Stop   07/07/19 1800  vancomycin (VANCOCIN) IVPB 1000 mg/200 mL premix     1,000 mg 200 mL/hr over 60 Minutes Intravenous Every 36 hours 07/07/19 1749     07/06/19 2000  ceFEPIme (MAXIPIME) 2 g in sodium chloride 0.9 % 100 mL IVPB     2 g 200 mL/hr over 30 Minutes Intravenous Every 12 hours 07/06/19 1046 07/12/19 2359   07/06/19 1030  cefTRIAXone (ROCEPHIN) 1 g in sodium chloride 0.9 %  100 mL IVPB  Status:  Discontinued     1 g 200 mL/hr over 30 Minutes Intravenous Every 24 hours 07/06/19 1016 07/06/19 1046   07/05/19 1200  ceFEPIme (MAXIPIME) 2 g in sodium chloride 0.9 % 100 mL IVPB  Status:  Discontinued     2 g 200 mL/hr over 30 Minutes Intravenous Every 12 hours 07/05/19 1105 07/06/19 1016   07/04/19 1800  vancomycin (VANCOREADY) IVPB 500 mg/100 mL  Status:  Discontinued     500 mg 100 mL/hr over 60 Minutes Intravenous Every 24 hours 06/22/2019 1741 07/04/19 1401   07/04/19 1800  ceFEPIme (MAXIPIME) 2 g in sodium chloride 0.9 % 100 mL IVPB  Status:  Discontinued     2 g 200 mL/hr  over 30 Minutes Intravenous Every 24 hours 07/06/2019 1741 07/05/19 1105   06/16/2019 1730  ceFEPIme (MAXIPIME) 2 g in sodium chloride 0.9 % 100 mL IVPB     2 g 200 mL/hr over 30 Minutes Intravenous  Once 06/18/2019 1726 07/10/2019 1845   07/05/2019 1715  vancomycin (VANCOCIN) IVPB 1000 mg/200 mL premix     1,000 mg 200 mL/hr over 60 Minutes Intravenous  Once 06/23/2019 1712 07/08/2019 1845   07/06/2019 1715  aztreonam (AZACTAM) 2 g in sodium chloride 0.9 % 100 mL IVPB  Status:  Discontinued     2 g 200 mL/hr over 30 Minutes Intravenous  Once 07/11/2019 1714 07/07/2019 1726       Assessment/Plan GERD HLD Acute on chronic anemia AKI on CKD Severe malnutrition - albumin 1.2, prealbumin < 5 Delirium  Sepsis Acute on chronicnormocyticanemia/suspectacute on chronic blood loss due to IBD - 1u PRBC 3/25  Crohns disease with perianal involvement (not on medications)- GI following, recommend consideration of biologic therapy at [facility] once acute infection resolved. 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 Does not need emergent surgery today. Udx with >1000000 pseudomonas. c diff negative. Lactic acid has normalized with resuscitation. Continue antibiotics and resuscitation. She eventually needs to have surgery. She refused this last week at [facility] and not sure when I discuss with her today. She didnot wantour teamto talk to her son or sister who are listed - per RN, yesterday it was established that the patients sister, Mariann Laster, wound be the contact person for the patient.She is very  malnourished and we would likely plan diverting ostomy at most. With prealbumin less than five there is no role for anything more than that.  -spoke to [place] today.  [place] from [place] right now as well as her other sister.  We discussed that patient was made DNR yesterday which is very reasonable.  Given her change in vitals, increasing WBC, vomiting, and worsening abdominal pain, we discussed that a CT scan would likely be best next step to determining if she has worsening of her overall disease in her abdomen or a new problem.  We discussed how aggressive she would like to be and how poorly I think the patient would do with an operation.  She would like to get the CT scan and then discuss options from there.  I think this is very reasonable.  Will make patient NPO currently given her emesis and I don't want her to aspirate to make this whole situation worse.  Will follow after CT scan has resulted with the family.  ID-vancomycin 3/23, maxipime 3/23>>(day#3),wbc normal, urine sensitivities are back and sensitive to cipro. Ok tonarrowabx to cipro from surgical standpoint but please also add flagyl for more intra-abdominal anaerobic coverage.  FEN -IVF, NPO, TPN VTE-SCDs,lovenox Foley-none     LOS: 5 days    Henreitta Cea , [facility] Surgery 07/08/2019, 10:49 AM Please see Amion for pager number during day hours 7:00am-4:30pm or 7:00am -11:30am on weekends

## 2019-07-08 NOTE — Progress Notes (Signed)
CT scan with progressive Crohn's colitis, discussed with the surgical team, and she is not candidate for surgical intervention.    Her family has decided to continue care under comfort measures.  Patient was placed on a morphine drip and continue end-of-life care per protocol.

## 2019-07-08 NOTE — Progress Notes (Signed)
TPN was stopped at 10:15 per order.

## 2019-07-08 NOTE — Progress Notes (Signed)
TPN was stopped at 09:41 per order.

## 2019-07-08 NOTE — Progress Notes (Signed)
Patient was transferred to med-surg level of care per MD verbal order.

## 2019-07-08 NOTE — Progress Notes (Addendum)
PHARMACY - TOTAL PARENTERAL NUTRITION CONSULT NOTE   Indication: Chronic; intolerance to enteral feeding  Patient Measurements: Height: 5' 4"  (162.6 cm) Weight: 116 lb 13.5 oz (53 kg) IBW/kg (Calculated) : 54.7 TPN AdjBW (KG): 53 Body mass index is 20.06 kg/m.  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 She was admitted from [facility] [place] facility with concern for sepsis. CT on 3/23 shows contained perforation in the sigmoid mesentery and possible fistula formation to distal small bowel.  No need for emergent surgery per Surgery team but may perform diverting ostomy. She is followed by [facility] and was to have surgery last week but it was post-poned by patient. Pharmacy consulted to continue TPN management.   Glucose / Insulin: CBG 73-196  Electrolytes: K 4.6, Mg 2.5, Phos 2 - will replace, CO2 21, Cl 112.  Renal: SCr up to 1.36, BUN 37.  LFTs / TGs: AST/ALT elevated on admission - now trending down (77/46), Tbili up 2, TG 150. No sign of jaundice per RN.  Prealbumin / albumin: Prealbumin <5 / Albumin 1.1 Intake / Output; MIVF: UOP 1.4 mL/kg/hr. Stools x 3.  GI Imaging: none since resuming TPN Surgeries / Procedures: none since resuming TPN  Central access: PICC (placed 04/18/19) TPN start date: Restart 3/25   Nutritional Goals (per RD recommendation 3/25): Kcal:  1750-1950 Protein:  85-100 grams Fluid:  >/= 1.8 L/day  Current Nutrition:  Clear liquid diet TPN - Chronic, has only missed one day  Chronic Concentrated TPN Formula (from [facility] [place]): Infuse 1400 mL over 16 hours with 1 hour taper up and down:  65g AA, 240g Dextrose with 55m trace elements, 215 mEq Na, 30 mEq K, 6 mmol Phos, no Calcium, and 10 mEq Mag, and CKG:SUPJSRP2:1. 50g Lipids four times per week. Providing  1576 kcals on lipid days and 1076 kcal on non-lipid days (average 1362 kcals/day). *[place], patient has been on TPN for months with little improvement in weight. Patient had been sensitive to electrolytes but lately has been requiring more. Patient was also on a regular diet in addition to TPN up until admission.   Plan:  Restart cyclic TPN at 15945PM : Infuse 1500 mL over 16 hours, rate of 50 ml/hr x 1 hour, then 100 ml/hr x 14 hrs, then 50 ml/hr x 1 hour Electrolytes in TPN: 120 mEq/L Na, 35 mEq/L K (decrease), no Calcium, 8 mEq/L Mg, and 15 mmol/L Phos (increase), adjust Cl:Ac at 1:2 Add MVI on MWF only due to nProducer, television/film/video  Add trace elements daily to TPN. Will start sensitive SSI q6hr. NaPhos 20 mmol x 1 Monitor for any signs of jaundice with elevated bilirubin - if jaundice, will reduce trace elements to half.   TPN labs Mon/Thurs  ADDENDUM:  Since the bag was already made will run TPN the night of 3/28, but will hold further TPN starting 3/29.   CAlanda Slim PharmD, FPasadena Surgery Center LLCClinical Pharmacist Please see AMION for all Pharmacists' Contact Phone Numbers 07/08/2019, 9:58 AM

## 2019-07-09 MED ORDER — GLYCOPYRROLATE 0.2 MG/ML IJ SOLN: 0.2000 mg | Freq: Once | INTRAMUSCULAR | Status: DC

## 2019-07-09 MED ORDER — SCOPOLAMINE 1 MG/3DAYS TD PT72
1.0000 | MEDICATED_PATCH | TRANSDERMAL | Status: DC
  Administered 2019-07-09: 1.5 mg via TRANSDERMAL
  Filled 2019-07-09: qty 1

## 2019-07-09 MED ORDER — DIPHENHYDRAMINE HCL 50 MG/ML IJ SOLN
INTRAMUSCULAR | Status: AC
  Filled 2019-07-09: qty 1

## 2019-07-12 NOTE — Progress Notes (Signed)
Around 04:00 this morning the patient began to sound gurgley in the upper respiratory tract.  Robinul was given as ordered to decrease secretions.  Orders were also received to deep suction the patient as needed for comfort.  Thick tan colored sputum was suctioned with audible relief of gurgles.  Shortly after the Robinul was administered this nurse noticed the appearance of a whelp on the right cheek of the patient.  No other skin eruptions, itching or increased SOB/throat tightness was observed.  Bodenheimer, NP was notified of the suspected robinul reaction.  Within an hour of initial sighting of eruption, the whelp disappeared.  Will inform day team primary/charge nurse of events.

## 2019-07-12 NOTE — Death Summary Note (Signed)
Death Summary  Katrina Boyer XBM:[id number] DOB: [birth year] DOA: 07-04-2019  PCP: Seward Carol, MD  Admit date: 2019-07-04 Date of Death: July 10, 2019 Time of Death: 13:30 Notification: Seward Carol, MD notified of death of 07/10/2019   History of present illness:  Katrina Boyer is a 69 y.o. female with a history of  Crohn's disease (chronic TPN), chronic kidney disease stage III, dyslipidemia and cognitive impairment Midge Minium presented with complaint of anemia and abdominal pain.   Midge Minium did not improve after aggressive medical therapy with IV antibiotic therapy, IV fluids and IV analgesics. Patient not candidate for surgical intervention.    Patient was admitted to the hospital working diagnosis acute colitis(contained perforation), complicated by sepsis, endorgan failure acute kidney injury/ hypokalemia and hypomagnesemia.Pseudomonas urine infection.  69 year old female who presented with anemia. She does have significant past medical history of Crohn's disease (chronic TPN), chronic kidney disease stage III, dyslipidemia and cognitive impairment. She was transferred from the nursing home due to low hemoglobin. Patient unable to give any detailed history due to cognitive impairment. 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   Patient was noted to have generalized weakness, lethargy and fever. Positive abdominal pain and distention. Upon her initial physical examination she was febrile 103 F, heart rate 122, respiratory rate 31, oxygen saturation 91%, dry mucous membranes, lungs clear to auscultation bilaterally, heart S1-S2 present and rhythmic,abdomen distended, generalized tenderness, no lower extremity edema. Sodium 137, potassium 2.6, chloride 104, bicarb 21, glucose 124, BUN 33, creatinine 1.43, white count 8.5, hemoglobin 7.0, hematocrit 24.3,  platelets 315.SARS COVID-19 was negative.Urinalysis white count more than 50, 0-5 red cells.  CT of the abdomen with increased longsegmental circumferential colonic wall thickening, abundant regional mesenteric inflammation, evidence of contained perforation into the sigmoid mesentery, possible fistula formation of the distal small bowel,complicated with left kidney obstructive uropathy. Chest radiograph no infiltrates.EKG 106 bpm, left axis deviation, normal intervals, sinus rhythm, no ST segment T wave changes.  Patient placed on broad spectrum antibiotic therapy, and supportive IV fluids and IV analgesics. Resumed TPN and consulted surgery and GI.  Patient with very poor prognosis, not candidate for surgical intervention, with severe calorie protein malnutrition.   Patient clinically worsening sepsis with developing septic shock, end-organ failure  hypotension and encephalopathy, very poor prognosis due to chronic illness and malnutrition. CODE status has been changed to DNR.   Final Diagnoses:   1. Acute Crohn's colitis(contained perforation)/ sepsis present on admission/ end organ failure AKI/ pseudomonas urine infection (present on admission  2. AKI with hypokalemia/ hypomagnesemia/ ckd stage 3a.  3. Acute on chronic anemia/ likely acute on chronic blood loss due to IBD.  4. Dyslipidemia.  5. Severe calorie protein malnutrition/ stage 2 coccyx pressure ulcer (present on admission)..  6. Anxiety and depression. 7. T2DM.   The results of significant diagnostics from this hospitalization (including imaging, microbiology, ancillary and laboratory) are listed below for reference.    Significant Diagnostic Studies: CT ABDOMEN PELVIS WO CONTRAST  Result Date: 07/08/2019 CLINICAL DATA:  Inpatient. Crohn disease with extensive disease, worsening abdominal pain, hypotension, fever, anemia, leukocytosis. History of diverting ostomy and takedown in 2011. EXAM: CT ABDOMEN  AND PELVIS WITHOUT CONTRAST TECHNIQUE: Multidetector CT imaging of the abdomen and pelvis was performed following the standard protocol without IV contrast. COMPARISON:  July 04, 2019 CT abdomen/pelvis. 07/06/2019 abdominal radiograph. FINDINGS: Lower chest: Small dependent bilateral pleural effusions, increased bilaterally. Complete right lower lobe  atelectasis. Mild passive dependent left lower lobe atelectasis. Patchy ground-glass opacities in the anterior upper lobes bilaterally, not previously imaged. Hepatobiliary: Normal liver size. No liver mass. Gallbladder not visualized, presumably surgically absent. Bile ducts are stable and within normal post cholecystectomy limits with CBD diameter 6 mm. Pancreas: Diffuse pancreatic parenchymal atrophy, unchanged. No pancreatic mass or duct dilation. Spleen: Normal size. No mass. Adrenals/Urinary Tract: Normal adrenals. Nonobstructing 5 mm upper and 3 mm lower right renal stones. Tiny parenchymal calcification in the upper left kidney is unchanged. No hydronephrosis. Simple bilateral renal cysts, largest an exophytic simple 8.6 cm posterior interpolar left renal cyst. No additional contour deforming renal lesions. Normal bladder. Stomach/Bowel: Normal non-distended stomach. Evidence of prior enteroenterostomy in the lower abdomen. No unexpected small bowel dilatation. No definite small bowel wall thickening. Severe diffuse colorectal wall thickening, substantially worsened in the interval. 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 Vascular/Lymphatic: Atherosclerotic nonaneurysmal abdominal aorta. No pathologically enlarged lymph nodes in the abdomen or pelvis. Reproductive: Uterus and left adnexal structures are inseparable from the complex sigmoid colon inflammatory process. No adnexal  masses. Other: Small volume ascites is increased. Moderate anasarca is new. Musculoskeletal: No aggressive appearing focal osseous lesions. Status post vertebroplasty in the L3 vertebral body. Chronic moderate T8 vertebral compression fracture. Moderate lumbar spondylosis. IMPRESSION: 1. Severe diffuse colorectal wall thickening, substantially worsened in the interval. New moderate anasarca. Increased small volume ascites. Increased small dependent bilateral pleural effusions. These findings could all be due to worsening third-spacing of fluid such as from severe hypoalbuminemia or fluid overload, although a pancolitis such as due to C diff colitis cannot be excluded. A component of worsening inflammatory bowel disease is also in the differential, although is less favored given the quick interval change and diffuse involvement. 2. 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 3. New patchy ground-glass opacities in the anterior upper lobes bilaterally, nonspecific with differential including atypical pneumonia. 4. Aortic Atherosclerosis (ICD10-I70.0). Electronically Signed   By: Ilona Sorrel M.D.   On: 07/08/2019 13:05   CT ABDOMEN PELVIS WO CONTRAST  Result Date: 06/26/2019 CLINICAL DATA:  69 year old female with Crohn disease. Increased lethargy and abdominal distension. Unexplained anemia. EXAM: CT ABDOMEN AND PELVIS WITHOUT CONTRAST TECHNIQUE: Multidetector CT imaging of the abdomen and pelvis was performed following the standard protocol without IV contrast. COMPARISON:  CT Abdomen and Pelvis 02/26/2019. FINDINGS: Lower chest: Respiratory motion. Mild lung base scarring or atelectasis but no definite lung base pulmonary inflammation. Trace layering pleural effusions. Mild cardiomegaly. No pericardial effusion. Hepatobiliary: Chronically absent  gallbladder. Negative noncontrast liver. Pancreas: Chronically atrophied. Spleen: Stable mild splenomegaly. Adrenals/Urinary Tract: Negative adrenal glands. Chronic left hydronephrosis and hydroureter with urothelial thickening. Stable inflammatory stranding at the left renal pelvis. A large exophytic left renal cyst with simple fluid density is stable. Mild left nephrolithiasis. No obstructive uropathy on the right although chronic right nephrolithiasis and areas of right renal cortical scarring. The bladder was abnormal in November but has a more normal appearance today, and does not appear involved in the pelvic abnormality emanating from the sigmoid colon described below. Stomach/Bowel: No free intraperitoneal fluid. There is trace free fluid in the left abdomen. Negative stomach and proximal small bowel. There is oral contrast mixed with  retained stool throughout the colon. In the anterior mid lower abdomen there is a surgical anastomosis at what appears to in the be distal small bowel. The native terminal ileum seems to remain in place along with a normal appearing appendix seen on coronal image 39. The TI is within normal limits. Decompressed transverse colon. Redundant descending colon containing stool mixed with contrast, and the large bowel becomes abnormal in the distal descending segment. From series 3, image 49 throughout the distal sigmoid, the large bowel is diffusely thick-walled, indistinct, and appears inflamed. There is associated inflammation along the left pelvic side wall, in the region of the left adnexa. There are multiple superimposed small areas of suspicious extraluminal gas (such as on sagittal image 61) and there is suspicion of a fistulous connection from the abnormal sigmoid colon to the distal small bowel such as on series 3, image 64. The abnormal sigmoid is also inseparable from the dorsal uterine fundus. See sagittal image 64. Vascular/Lymphatic: Vascular patency is not evaluated  in the absence of IV contrast. Extensive Aortoiliac calcified atherosclerosis. No lymphadenopathy identified. Reproductive: The uterus is identified on sagittal image 59, but is difficult to delineate from the abnormal sigmoid colon. Normal right adnexa. Left adnexa cannot be delineated from the abnormal sigmoid colon. Other: No pelvic free fluid. Musculoskeletal: Chronic mid lumbar vertebral body augmentation. Chronic appearing lower thoracic vertebral endplate compression. Osteopenia. No acute osseous abnormality identified. IMPRESSION: 1. Progression of abnormal sigmoid colon since November, favored due to inflammatory bowel disease affecting the distal descending and sigmoid. There is increased long segment circumferential colonic wall thickening, abundant regional mesenteric inflammation, evidence of a CONTAINED PERFORATION into the sigmoid mesentery, and possible fistula formation to distal small bowel. The inflammation is also inseparable from the uterine fundus and left adnexa. No drainable fluid collection is evident. 2. Chronic inflammation of the left kidney and ureter with a degree of obstructive uropathy. And this may be indicative of distal left ureteral involvement in #1. 3. Mild cardiomegaly. Trace pleural effusions. Aortic Atherosclerosis (ICD10-I70.0). Chronic nephrolithiasis. Mild chronic splenomegaly. Electronically Signed   By: Genevie Ann M.D.   On: 06/16/2019 20:48   DG Chest 1 View  Result Date: 07/07/2019 CLINICAL DATA:  Cognitive disorder. EXAM: CHEST  1 VIEW COMPARISON:  Chest radiograph 06/15/2019 FINDINGS: Left upper extremity PICC line tip projects over the superior vena cava. Monitoring leads overlie the patient. Cardiomegaly. Pulmonary vascular redistribution and bilateral perihilar interstitial pulmonary opacities. No pleural effusion or pneumothorax. Thoracic spine degenerative changes. IMPRESSION: Cardiomegaly. Bilateral perihilar interstitial and diffuse lung interstitial  opacities favored to represent pulmonary edema. Atypical infection could appear similar. Electronically Signed   By: Lovey Newcomer M.D.   On: 07/07/2019 16:51   DG Abd 1 View  Result Date: 07/06/2019 CLINICAL DATA:  Bowel obstruction. EXAM: ABDOMEN - 1 VIEW COMPARISON:  07/02/2019 FINDINGS: Interval improvement in previously noted dilated small bowel loops. No pathologic dilatation of large or small bowel loops noted at this time. There is a stone within the right kidney measuring 6 mm. IMPRESSION: 1. Improving small bowel distension. 2. Right renal calculus. Electronically Signed   By: Kerby Moors M.D.   On: 07/06/2019 10:57   DG Chest Port 1 View  Result Date: 06/24/2019 CLINICAL DATA:  Fever. EXAM: PORTABLE CHEST 1 VIEW COMPARISON:  April 12, 2019 FINDINGS: The right-sided PICC line seen on the prior study has been removed. A left-sided PICC line is seen with its distal tip noted near the junction of the superior  vena cava and right atrium. Mild, chronic appearing increased lung markings are seen without evidence of acute infiltrate, pleural effusion or pneumothorax. The heart size and mediastinal contours are within normal limits. The visualized skeletal structures are unremarkable. IMPRESSION: No active disease. Electronically Signed   By: Virgina Norfolk M.D.   On: 07/08/2019 17:27    Microbiology: Recent Results (from the past 240 hour(s))  Blood Culture (routine x 2)     Status: None   Collection Time: 07/02/2019  5:20 PM   Specimen: BLOOD  Result Value Ref Range Status   Specimen Description BLOOD LEFT ANTECUBITAL  Final   Special Requests   Final    BOTTLES DRAWN AEROBIC AND ANAEROBIC Blood Culture adequate volume   Culture   Final    NO GROWTH 5 DAYS Performed at [facility], [address], [place], [place] [address]    Report Status 07/08/2019 FINAL  Final  Blood Culture (routine x 2)     Status: None   Collection Time: 07/11/2019  5:32 PM   Specimen: BLOOD RIGHT HAND   Result Value Ref Range Status   Specimen Description BLOOD RIGHT HAND  Final   Special Requests   Final    BOTTLES DRAWN AEROBIC AND ANAEROBIC Blood Culture adequate volume   Culture   Final    NO GROWTH 5 DAYS Performed at [facility], [place] [address] [place], [place] [address]    Report Status 07/08/2019 FINAL  Final  Urine culture     Status: Abnormal   Collection Time: 06/20/2019  6:27 PM   Specimen: In/Out Cath Urine  Result Value Ref Range Status   Specimen Description IN/OUT CATH URINE  Final   Special Requests   Final    NONE Performed at [facility], [place] [address], [place], [place] [address]    Culture >=100,000 COLONIES/mL PSEUDOMONAS AERUGINOSA (A)  Final   Report Status 07/05/2019 FINAL  Final   Organism ID, Bacteria PSEUDOMONAS AERUGINOSA (A)  Final      Susceptibility   Pseudomonas aeruginosa - MIC*    CEFTAZIDIME 4 SENSITIVE Sensitive     CIPROFLOXACIN <=0.25 SENSITIVE Sensitive     GENTAMICIN 8 INTERMEDIATE Intermediate     IMIPENEM 1 SENSITIVE Sensitive     PIP/TAZO <=4 SENSITIVE Sensitive     * >=100,000 COLONIES/mL PSEUDOMONAS AERUGINOSA  Respiratory Panel by RT PCR (Flu A&B, Covid) - Nasopharyngeal Swab     Status: None   Collection Time: 06/28/2019 10:08 PM   Specimen: Nasopharyngeal Swab  Result Value Ref Range Status   SARS Coronavirus 2 by RT PCR NEGATIVE NEGATIVE Final    Comment: (NOTE) SARS-CoV-2 target nucleic acids are NOT DETECTED. The SARS-CoV-2 RNA is generally detectable in upper respiratoy specimens during the acute phase of infection. The lowest concentration of SARS-CoV-2 viral copies this assay can detect is 131 copies/mL. A negative result does not preclude SARS-Cov-2 infection and should not be used as the sole basis for treatment or other patient management decisions. A negative result may occur with  improper specimen collection/handling, submission of specimen other than nasopharyngeal swab, presence of viral  mutation(s) within the areas targeted by this assay, and inadequate number of viral copies (<131 copies/mL). A negative result must be combined with clinical observations, patient history, and epidemiological information. The expected result is Negative. Fact Sheet for Patients:  PinkCheek.be Fact Sheet for Healthcare Providers:  GravelBags.it This test is not yet ap proved or cleared by the  [place] Architectural technologist and  has been authorized for detection and/or diagnosis of SARS-CoV-2 by FDA under an Print production planner (EUA). This EUA will remain  in effect (meaning this test can be used) for the duration of the COVID-19 declaration under Section 564(b)(1) of the Act, 21 U.S.C. section 360bbb-3(b)(1), unless the authorization is terminated or revoked sooner.    Influenza A by PCR NEGATIVE NEGATIVE Final   Influenza B by PCR NEGATIVE NEGATIVE Final    Comment: (NOTE) The Xpert Xpress SARS-CoV-2/FLU/RSV assay is intended as an aid in  the diagnosis of influenza from Nasopharyngeal swab specimens and  should not be used as a sole basis for treatment. Nasal washings and  aspirates are unacceptable for Xpert Xpress SARS-CoV-2/FLU/RSV  testing. Fact Sheet for Patients: PinkCheek.be Fact Sheet for Healthcare Providers: GravelBags.it This test is not yet approved or cleared by the [place] FDA and  has been authorized for detection and/or diagnosis of SARS-CoV-2 by  FDA under an Emergency Use Authorization (EUA). This EUA will remain  in effect (meaning this test can be used) for the duration of the  Covid-19 declaration under Section 564(b)(1) of the Act, 21  U.S.C. section 360bbb-3(b)(1), unless the authorization is  terminated or revoked. Performed at [facility], [place] [address], [place], [place] [address]   C Difficile Quick Screen w PCR reflex     Status:  None   Collection Time: 07/04/19  3:40 PM   Specimen: STOOL  Result Value Ref Range Status   C Diff antigen NEGATIVE NEGATIVE Final   C Diff toxin NEGATIVE NEGATIVE Final   C Diff interpretation No C. difficile detected.  Final    Comment: Performed at [facility], [place] [address], [place], [place] [address]  MRSA PCR Screening     Status: Abnormal   Collection Time: 07/07/19 12:08 PM   Specimen: Nasopharyngeal  Result Value Ref Range Status   MRSA by PCR POSITIVE (A) NEGATIVE Final    Comment:        The GeneXpert MRSA Assay (FDA approved for NASAL specimens only), is one component of a comprehensive MRSA colonization surveillance program. It is not intended to diagnose MRSA infection nor to guide or monitor treatment for MRSA infections. RESULT CALLED TO, READ BACK BY AND VERIFIED WITH: Jefferson Fuel, RN AT [place] ON 07/07/19 BY C. JESSUP, MT. Performed at [facility], [place] [address], [place], [place] [address]      Labs: Basic Metabolic Panel: Recent Labs  Lab 07/04/19 0250 07/04/19 0250 07/05/19 0353 07/05/19 0353 07/05/19 2013 07/05/19 2013 07/07/19 0214 07/08/19 0854  NA 143  --  143  --  141  --  137 140  K 3.5   < > 3.0*   < > 3.9   < > 3.0* 4.6  CL 115*  --  118*  --  116*  --  109 112*  CO2 15*  --  17*  --  16*  --  20* 21*  GLUCOSE 78  --  70  --  305*  --  175* 207*  BUN 32*  --  36*  --  31*  --  31* 37*  CREATININE 1.77*  --  1.50*  --  1.48*  --  1.20* 1.36*  CALCIUM 7.5*  --  7.7*  --  7.6*  --  7.5* 7.4*  MG 1.9  --  1.9  --  2.3  --  1.7 2.5*  PHOS 3.3  --  2.9  --  1.9*  --  1.0* 2.0*   < > = values in this interval not displayed.   Liver Function Tests: Recent Labs  Lab 07/04/2019 1539 07/04/19 0250 07/05/19 0353  AST 42* 141* 77*  ALT 27 57* 46*  ALKPHOS 178* 227* 222*  BILITOT 0.9 1.5* 2.0*  PROT 7.0 5.8* 5.4*  ALBUMIN 1.2* 1.1* 1.1*   No results for input(s): LIPASE, AMYLASE in the last 168 hours. No results for input(s):  AMMONIA in the last 168 hours. CBC: Recent Labs  Lab 07/04/19 0250 07/05/19 0353 07/06/19 1018 07/07/19 1613 07/08/19 0854  WBC 17.5* 9.1 8.1 22.0* 27.9*  NEUTROABS  --  7.2 7.7 15.7* 20.6*  HGB 7.0* 6.7* 9.4* 8.9* 9.5*  HCT 24.8* 22.1* 30.4* 29.1* 30.6*  MCV 97.3 90.6 90.7 89.8 90.5  PLT 240 211 166 146* 111*   Cardiac Enzymes: No results for input(s): CKTOTAL, CKMB, CKMBINDEX, TROPONINI in the last 168 hours. D-Dimer No results for input(s): DDIMER in the last 72 hours. BNP: Invalid input(s): POCBNP CBG: Recent Labs  Lab 07/07/19 1200 07/07/19 1757 07/07/19 2323 07/08/19 0537 07/08/19 1244  GLUCAP 130* 73 204* 196* 115*   Anemia work up No results for input(s): VITAMINB12, FOLATE, FERRITIN, TIBC, IRON, RETICCTPCT in the last 72 hours. Urinalysis    Component Value Date/Time   COLORURINE YELLOW 06/28/2019 1844   APPEARANCEUR CLOUDY (A) 06/27/2019 1844   LABSPEC 1.010 06/21/2019 1844   PHURINE 7.0 06/16/2019 1844   GLUCOSEU NEGATIVE 06/20/2019 1844   HGBUR MODERATE (A) 07/08/2019 1844   BILIRUBINUR NEGATIVE 06/24/2019 1844   [place] NEGATIVE 06/13/2019 1844   PROTEINUR 100 (A) 07/02/2019 1844   NITRITE NEGATIVE 07/02/2019 1844   LEUKOCYTESUR LARGE (A) 06/23/2019 1844   Sepsis Labs Invalid input(s): PROCALCITONIN,  WBC,  LACTICIDVEN     SIGNED:  Tawni Millers, MD  [facility] 2019-07-23, 2:56 PM Pager   If 7PM-7AM, please contact night-coverage [email or web address] Password [email or web address]

## 2019-07-12 NOTE — Death Summary Note (Signed)
Pt was assessed about 1330. Did not seem in distress. Son at the bedside. 70 son come out from the room and asked to check on pt. No pulse, heart or lung sounds noted. Pupils non-reactive. Emotional support was given. MD notified

## 2019-07-12 DEATH — deceased

## 2020-04-21 IMAGING — CT CT ABD-PELV W/ CM
2 of 5 series · 15 of 46 positions shown, 17 images · IV contrast (omnipaque)
Comparison: None.

CLINICAL DATA: Abdominal pain

EXAM:
CT ABDOMEN AND PELVIS WITH CONTRAST
TECHNIQUE: Multidetector CT imaging of the abdomen and pelvis was performed
using the standard protocol following bolus administration of
intravenous contrast.
CONTRAST:  75mL OMNIPAQUE IOHEXOL 300 MG/ML  SOLN

[Series 4: axial st · axial · 0.70mm/px · z∈[-428,-53]mm · 12 of 87 slices shown, 14 images]
[im 6/87  soft-tissue]
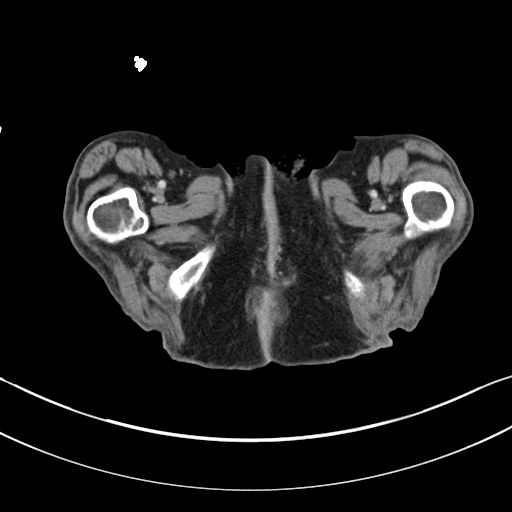
[im 6/87  bone]
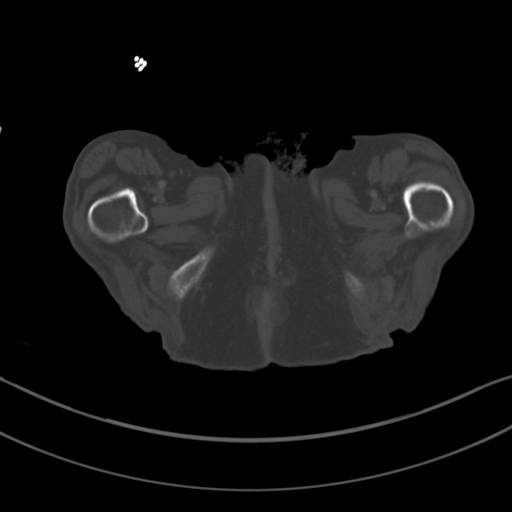
[im 12/87  soft-tissue]
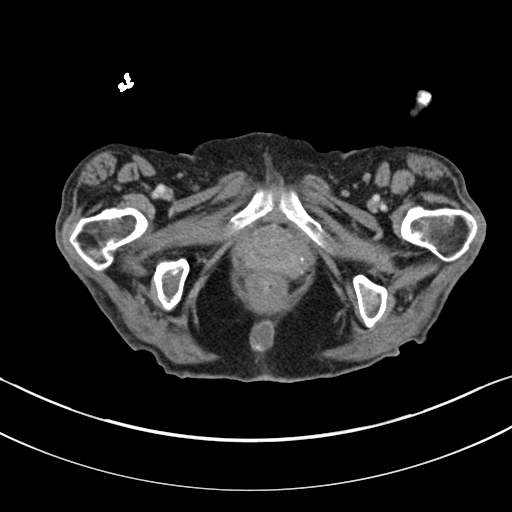
[im 18/87  soft-tissue]
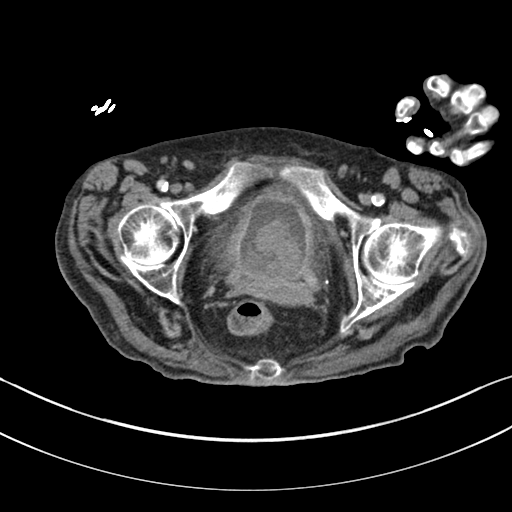
[im 29/87  soft-tissue]
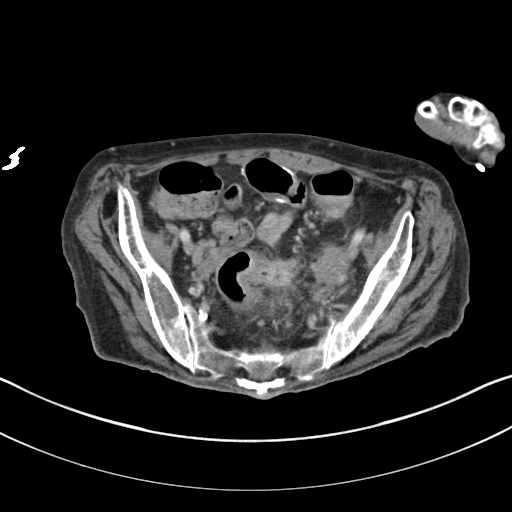
[im 35/87  soft-tissue]
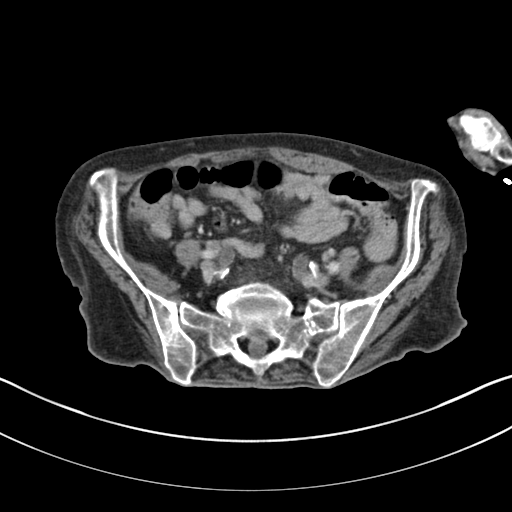
[im 41/87  soft-tissue]
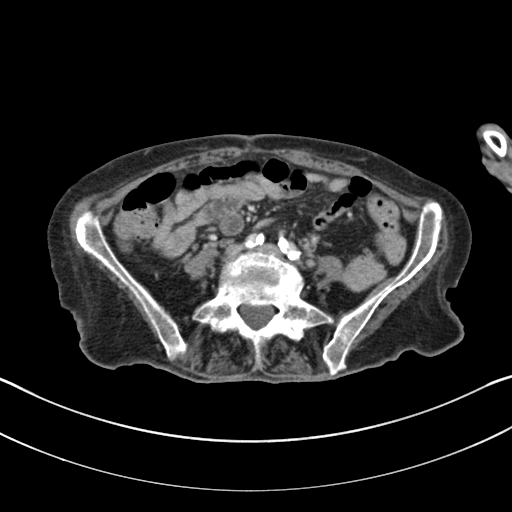
[im 46/87  soft-tissue]
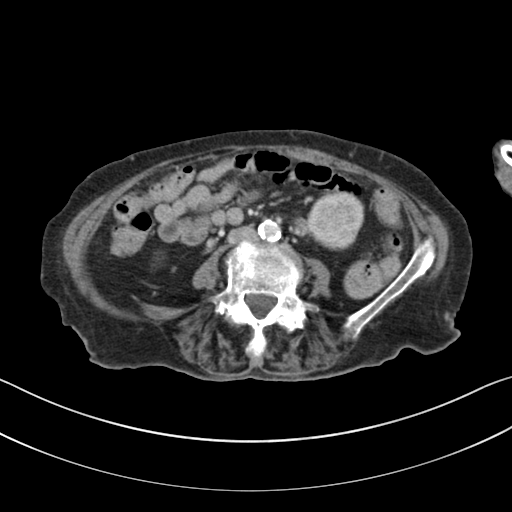
[im 52/87  soft-tissue]
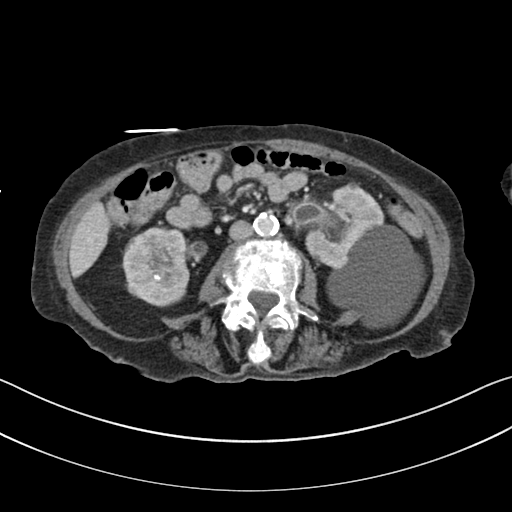
[im 58/87  soft-tissue]
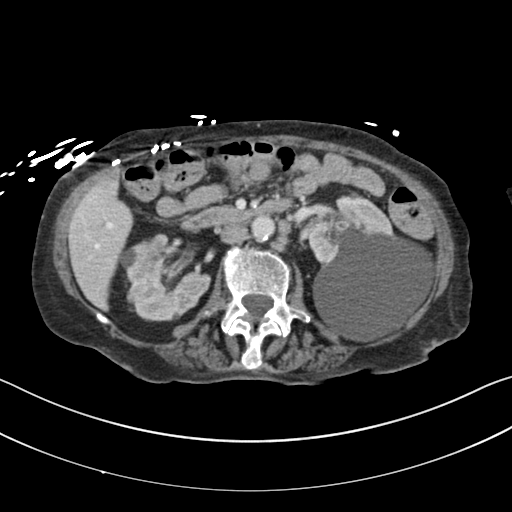
[im 58/87  bone]
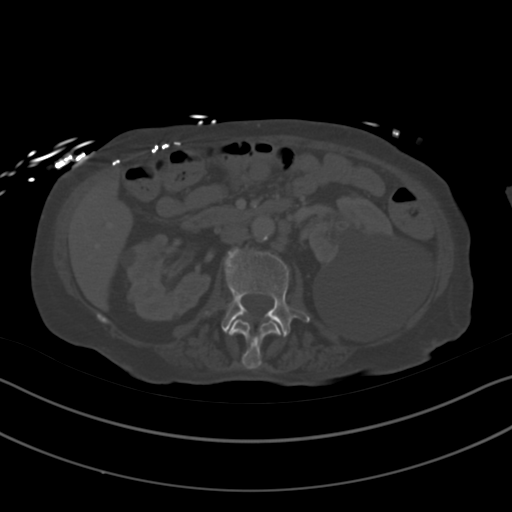
[im 69/87  soft-tissue]
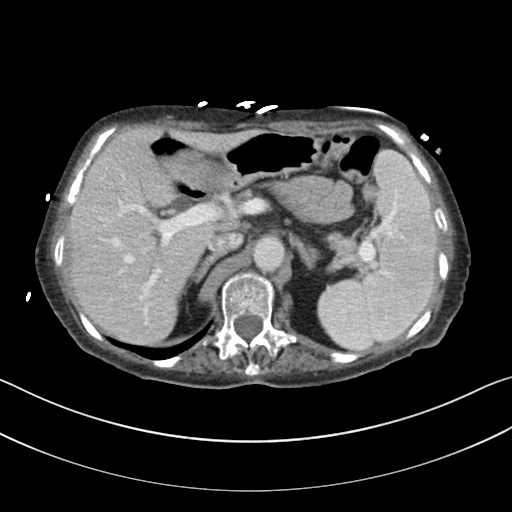
[im 75/87  soft-tissue]
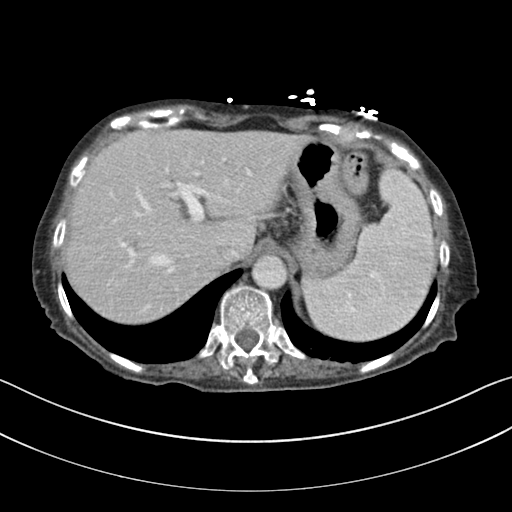
[im 81/87  soft-tissue]
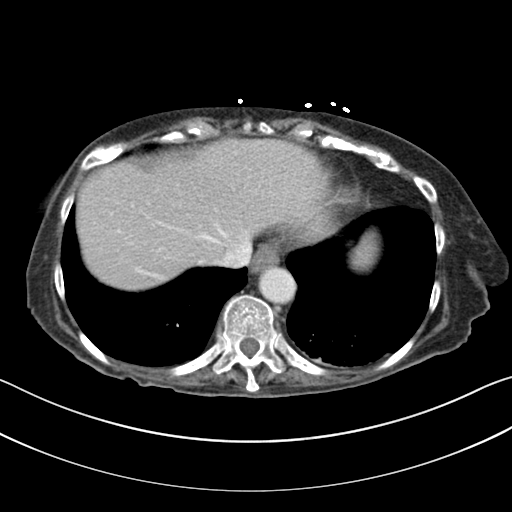

[Series 5: coronal st · coronal · 0.66mm/px · 3 of 104 slices shown]
[im 35/104  soft-tissue]
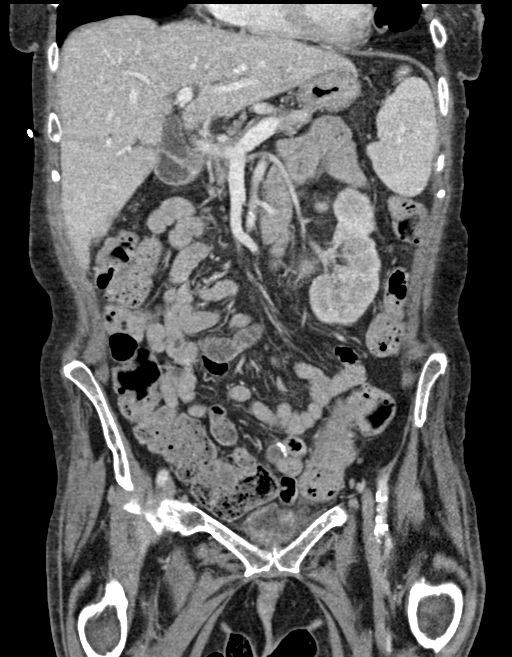
[im 46/104  soft-tissue]
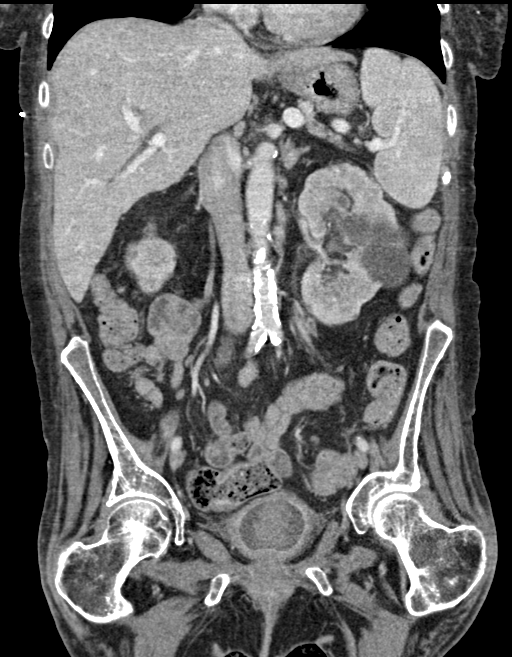
[im 58/104  soft-tissue]
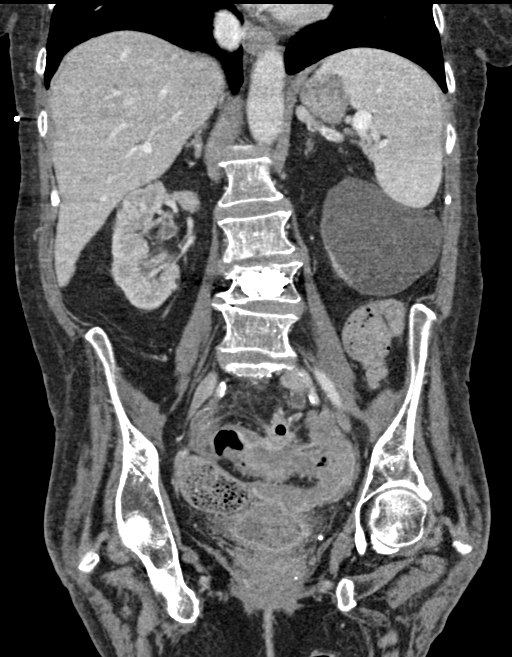

[15 of 46 positions shown; findings below may reference images not displayed]

FINDINGS: Lower chest: There is bronchiectasis with tree-in-bud nodular
densities in the left lower lobe and lingula. No effusions.

Hepatobiliary: No focal hepatic abnormality.  Prior cholecystectomy

Pancreas: No focal abnormality or ductal dilatation.

Spleen: No focal abnormality.  Normal size.

Adrenals/Urinary Tract: Large cyst off the posterior left kidney
measures 9 cm. Multiple small cysts in the right kidney. There is
bilateral collecting system and ureteral wall enhancement. There
appears to be enhancement in the bladder wall. This could reflect
infection. No overt hydronephrosis. Nonobstructing stones in the
right kidney. Abnormal density noted centrally within the urinary
bladder. This could reflect blood clot, less likely bladder wall
mass.

Stomach/Bowel: Abnormal appearance of the sigmoid colon. There is
sigmoid diverticulosis. There is wall thickening and surrounding
stranding within the mid sigmoid colon. A locule of gas superior to
the sigmoid colon could be extraluminal or within a diverticulum.
While the findings could be related to diverticulosis and
diverticulitis, cannot exclude annular neoplasm/cancer. This
warrants follow-up. No evidence of bowel obstruction.

Vascular/Lymphatic: Heavily calcified aorta. No evidence of aneurysm
or adenopathy.

Reproductive: Uterus and adnexa unremarkable.  No mass.

Other: No free fluid or free air.

Musculoskeletal: No acute bony abnormality.
IMPRESSION: Abnormal appearance of the sigmoid colon. There are scattered
diverticula. There is wall thickening and stranding surrounding the
mid sigmoid colon with possible extraluminal gas versus gas within a
diverticulum. While findings could be related to diverticulosis and
diverticulitis, annular neoplasm cannot be excluded. Recommend
correlation with any recent colonoscopy is. If there has not been a
recent colonoscopy, 1 should be considered.

Abnormal appearance of the renal collecting system, ureters and
bladder wall which appear to enhance. This may be related to urinary
tract infection. Abnormal density centrally within the bladder,
favor blood clot although bladder wall mass cannot be completely
excluded.

Right nephrolithiasis.  No hydronephrosis.

Aortic atherosclerosis.

Bronchiectasis with tree-in-bud densities in the lingula and left
lower lobe, likely related to prior infection.
